# Patient Record
Sex: Male | Born: 1950 | Race: White | Hispanic: No | Marital: Single | Smoking: Never smoker
Health system: Southern US, Community
[De-identification: ages and names within clinical notes are randomized; demographics above are authoritative.]

## PROBLEM LIST (undated history)

## (undated) DIAGNOSIS — I219 Acute myocardial infarction, unspecified: Secondary | ICD-10-CM

## (undated) DIAGNOSIS — I2510 Atherosclerotic heart disease of native coronary artery without angina pectoris: Secondary | ICD-10-CM

## (undated) DIAGNOSIS — Z955 Presence of coronary angioplasty implant and graft: Secondary | ICD-10-CM

## (undated) DIAGNOSIS — I10 Essential (primary) hypertension: Secondary | ICD-10-CM

## (undated) DIAGNOSIS — E119 Type 2 diabetes mellitus without complications: Secondary | ICD-10-CM

---

## 2006-06-03 ENCOUNTER — Emergency Department (HOSPITAL_COMMUNITY): Admission: EM | Admit: 2006-06-03 | Discharge: 2006-06-03 | Payer: Self-pay | Admitting: Emergency Medicine

## 2006-12-31 ENCOUNTER — Ambulatory Visit: Payer: Self-pay | Admitting: Ophthalmology

## 2007-01-07 ENCOUNTER — Ambulatory Visit: Payer: Self-pay | Admitting: Ophthalmology

## 2007-02-03 ENCOUNTER — Ambulatory Visit: Payer: Self-pay | Admitting: Ophthalmology

## 2007-02-10 ENCOUNTER — Ambulatory Visit: Payer: Self-pay | Admitting: Ophthalmology

## 2007-05-24 ENCOUNTER — Emergency Department: Payer: Self-pay | Admitting: Emergency Medicine

## 2007-05-24 ENCOUNTER — Other Ambulatory Visit: Payer: Self-pay

## 2007-08-26 ENCOUNTER — Emergency Department: Payer: Self-pay | Admitting: Emergency Medicine

## 2007-09-18 ENCOUNTER — Ambulatory Visit: Payer: Self-pay

## 2007-11-21 ENCOUNTER — Other Ambulatory Visit: Payer: Self-pay

## 2007-11-21 ENCOUNTER — Ambulatory Visit: Payer: Self-pay | Admitting: General Practice

## 2007-12-25 ENCOUNTER — Ambulatory Visit: Payer: Self-pay | Admitting: Cardiology

## 2007-12-25 ENCOUNTER — Other Ambulatory Visit: Payer: Self-pay

## 2008-02-25 ENCOUNTER — Ambulatory Visit: Payer: Self-pay | Admitting: General Practice

## 2008-03-11 ENCOUNTER — Ambulatory Visit: Payer: Self-pay | Admitting: General Practice

## 2008-03-18 ENCOUNTER — Ambulatory Visit: Payer: Self-pay | Admitting: General Practice

## 2008-06-11 ENCOUNTER — Ambulatory Visit: Payer: Self-pay | Admitting: Cardiology

## 2011-03-01 ENCOUNTER — Inpatient Hospital Stay: Payer: Self-pay | Admitting: Psychiatry

## 2011-03-14 ENCOUNTER — Ambulatory Visit: Payer: Self-pay

## 2011-06-02 ENCOUNTER — Ambulatory Visit: Payer: Self-pay | Admitting: General Practice

## 2011-06-21 ENCOUNTER — Ambulatory Visit (HOSPITAL_COMMUNITY)
Admission: RE | Admit: 2011-06-21 | Discharge: 2011-06-21 | Disposition: A | Discharge status: Home / Self Care | Payer: Medicare Other | Source: Ambulatory Visit | Attending: Neurosurgery | Admitting: Neurosurgery

## 2011-06-21 ENCOUNTER — Encounter (HOSPITAL_COMMUNITY)
Admission: RE | Admit: 2011-06-21 | Discharge: 2011-06-21 | Disposition: A | Discharge status: Home / Self Care | Payer: Medicare Other | Source: Ambulatory Visit | Attending: Neurosurgery | Admitting: Neurosurgery

## 2011-06-21 ENCOUNTER — Other Ambulatory Visit (HOSPITAL_COMMUNITY): Payer: Self-pay | Admitting: Neurosurgery

## 2011-06-21 DIAGNOSIS — M4802 Spinal stenosis, cervical region: Secondary | ICD-10-CM

## 2011-06-21 DIAGNOSIS — Z01818 Encounter for other preprocedural examination: Secondary | ICD-10-CM | POA: Insufficient documentation

## 2011-06-21 DIAGNOSIS — Z01812 Encounter for preprocedural laboratory examination: Secondary | ICD-10-CM | POA: Insufficient documentation

## 2011-06-21 LAB — CBC
MCH: 30.3 pg (ref 26.0–34.0)
MCHC: 34.6 g/dL (ref 30.0–36.0)
Platelets: 182 10*3/uL (ref 150–400)

## 2011-06-21 LAB — BASIC METABOLIC PANEL
Calcium: 9.4 mg/dL (ref 8.4–10.5)
GFR calc Af Amer: >60 mL/min (ref >60)
GFR calc non Af Amer: >60 mL/min (ref >60)
Sodium: 138 mEq/L (ref 135–145)

## 2011-06-21 LAB — SURGICAL PCR SCREEN
MRSA, PCR: NEGATIVE
Staphylococcus aureus: NEGATIVE

## 2011-06-26 ENCOUNTER — Inpatient Hospital Stay (HOSPITAL_COMMUNITY): Payer: Medicare Other

## 2011-06-26 ENCOUNTER — Inpatient Hospital Stay (HOSPITAL_COMMUNITY)
Admission: RE | Admit: 2011-06-26 | Discharge: 2011-07-03 | DRG: 473 | Disposition: A | Discharge status: Home / Self Care | Payer: Medicare Other | Source: Ambulatory Visit | Attending: Neurosurgery | Admitting: Neurosurgery

## 2011-06-26 DIAGNOSIS — E1142 Type 2 diabetes mellitus with diabetic polyneuropathy: Secondary | ICD-10-CM | POA: Diagnosis present

## 2011-06-26 DIAGNOSIS — I10 Essential (primary) hypertension: Secondary | ICD-10-CM | POA: Diagnosis present

## 2011-06-26 DIAGNOSIS — R339 Retention of urine, unspecified: Secondary | ICD-10-CM | POA: Diagnosis not present

## 2011-06-26 DIAGNOSIS — Z794 Long term (current) use of insulin: Secondary | ICD-10-CM

## 2011-06-26 DIAGNOSIS — M5030 Other cervical disc degeneration, unspecified cervical region: Principal | ICD-10-CM | POA: Diagnosis present

## 2011-06-26 DIAGNOSIS — E1149 Type 2 diabetes mellitus with other diabetic neurological complication: Secondary | ICD-10-CM | POA: Diagnosis present

## 2011-06-26 DIAGNOSIS — I252 Old myocardial infarction: Secondary | ICD-10-CM

## 2011-06-26 DIAGNOSIS — Z9861 Coronary angioplasty status: Secondary | ICD-10-CM

## 2011-06-26 DIAGNOSIS — F341 Dysthymic disorder: Secondary | ICD-10-CM | POA: Diagnosis present

## 2011-06-26 LAB — GLUCOSE, CAPILLARY
Glucose-Capillary: 139 mg/dL — ABNORMAL HIGH (ref 70–99)
Glucose-Capillary: 88 mg/dL (ref 70–99)

## 2011-06-26 LAB — TYPE AND SCREEN: ABO/RH(D): O POS

## 2011-06-26 LAB — ABO/RH: ABO/RH(D): O POS

## 2011-06-27 LAB — GLUCOSE, CAPILLARY: Glucose-Capillary: 161 mg/dL — ABNORMAL HIGH (ref 70–99)

## 2011-06-28 LAB — GLUCOSE, CAPILLARY
Glucose-Capillary: 85 mg/dL (ref 70–99)
Glucose-Capillary: 71 mg/dL (ref 70–99)

## 2011-06-29 LAB — GLUCOSE, CAPILLARY
Glucose-Capillary: 224 mg/dL — ABNORMAL HIGH (ref 70–99)
Glucose-Capillary: 120 mg/dL — ABNORMAL HIGH (ref 70–99)

## 2011-07-02 LAB — GLUCOSE, CAPILLARY
Glucose-Capillary: 211 mg/dL — ABNORMAL HIGH (ref 70–99)
Glucose-Capillary: 259 mg/dL — ABNORMAL HIGH (ref 70–99)
Glucose-Capillary: 283 mg/dL — ABNORMAL HIGH (ref 70–99)
Glucose-Capillary: 137 mg/dL — ABNORMAL HIGH (ref 70–99)
Glucose-Capillary: 149 mg/dL — ABNORMAL HIGH (ref 70–99)
Glucose-Capillary: 85 mg/dL (ref 70–99)

## 2011-07-03 LAB — GLUCOSE, CAPILLARY
Glucose-Capillary: 127 mg/dL — ABNORMAL HIGH (ref 70–99)
Glucose-Capillary: 79 mg/dL (ref 70–99)

## 2011-07-03 NOTE — Op Note (Signed)
NAME:  Daniel [name], Daniel [name] NO.:  [id number]  MEDICAL RECORD NO.:  [id number]  LOCATION:  3107                         FACILITY:  [facility]  PHYSICIAN:  Hilda Lias, M.D.   DATE OF BIRTH:  Jul 17, 1951  DATE OF PROCEDURE:  06/26/2011 DATE OF DISCHARGE:                              OPERATIVE REPORT   PREOPERATIVE DIAGNOSIS:  Degenerative disk disease with chronic radiculopathy C3-4, C4-5, C5-6, C6-7 with diabetes, depression  POSTOPERATIVE DIAGNOSES:  Degenerative disk disease with chronic radiculopathy C3-4, C4-5, C5-6, C6-7 with diabetes, depression.  PROCEDURE:  Anterior C3-4, C4-5, C5-6, C6-7 diskectomy, decompression of the spinal cord, interbody fusion plate microscope.  SURGEON:  Hilda Lias, MD  ASSISTANT:  Coletta Memos, MD  CLINICAL HISTORY:  The patient was seen in my office complaining of neck pain worsened to the both upper extremity, left worse than right one for many years.  The patient has a history of diabetes and depression.  X- rays showed severe degenerative disk disease with almost no space between C3-4, C4-5, C5-6, C6-7.  Surgery was advised and the risks were explained to him and his brother.  PROCEDURE:  The patient was taken to the OR and after intubation, the left side of the neck was cleaned with DuraPrep.  Longitudinal incision was made through the skin, subcutaneous tissue, platysma down to cervical spine.  Immediately we found large anterior osteophytes which we removed.  Then we opened the lumbar disk after we proved that was C6- 7.  To get into the disk space, we had to drill all the way down to the posterior ligament which was calcified.  We had to drill also posterior ligament to be able to get into the epidural space.  Then using the 1 and 2-mm Kerrison punch, decompression of the spinal cord as well as the both C7 nerve root was done.  The same procedure was done at the level of C5-6.  The most difficult part was at the level  of C4-5 and C3-4 because this patient has almost no space and there were large osteophyte.  The osteophytes were drilled and then using a needle we were able to identify the space.  We opened the calcified ligament at C3- 4 and C4-5 and we started going to the posterior ligament.  At the end we had decompression of the C4 nerve root, the spinal cord at the level C3-4 and also at the level of C4-5.  Having good decompression, an allograft with autograft inside and PBX was inserted at the level of C3- 4.  The size was 7 mm height lordotic.  Another one of 7 mm was inserted at the level of C4-5, C5-6, C6-7.  Then we had to drill to adjust the plate.  We had to reduce hyperflexion of the plate and remove more osteophyte.  At the end we used a plate from C3 down to C7 using 10 screws.  Lateral cervical spine showed good position of the graft and the plate and the screws.  Although we achieved good hemostasis because of the extension of the procedure, we left graft in the precervical area.  From then on the wound was closed with Vicryl and Steri-Strips.  ______________________________ Hilda Lias, M.D.     EB/MEDQ  D:  06/26/2011  T:  06/26/2011  Job:  [id number]  Electronically Signed by Hilda Lias M.D. on 07/03/2011 05:57:25 PM

## 2011-07-03 NOTE — Discharge Summary (Signed)
  NAMEMarland [name]  SHALOM, MCGUINESS NO.:  [id number]  MEDICAL RECORD NO.:  [id number]  LOCATION:  3022                         FACILITY:  [facility]  PHYSICIAN:  Hilda Lias, M.D.   DATE OF BIRTH:  September 04, 1951  DATE OF ADMISSION:  06/26/2011 DATE OF DISCHARGE:  07/03/2011                              DISCHARGE SUMMARY   ADMISSION DIAGNOSES:  Degenerative disk disease, cervical spine, C3-4, C4-5, C5-6, C6-7, with diabetes, depression.  POSTOPERATIVE DIAGNOSES:  Degenerative disk disease, cervical spine, C3- 4, C4-5, C5-6, C6-7, with diabetes, depression, urinary retention.  CLINICAL HISTORY:  The patient was admitted because of neck pain, radiating to both upper extremity.  X-ray showed quite a bit of cervical stenosis of C3-C7.  The patient has a history of depression plus diabetes.  In the past, he has problem with his bladder.  Because of the finding, surgery was advised.  LABORATORY:  Right now is grossly normal. COURSE IN THE HOSPITAL:  The patient was taken to surgery and anterior decompression from C3-C7 with plate was achieved.  The patient did well. We kept him overnight in the intensive care unit, later on he was transferred to the floor.  His main problem right now he has been having urinary retention.  We have using Flomax.  He was seen by the urologist yesterday who feel that he has an enlarged prostate.  The patient already has an appointment to see by his urologist, see Dr. Achilles Dunk in [place].  Today, neck wise, he is doing really well.  His glucose well controlled.  He is being discharged.  The brother is here with him.  CONDITION ON DISCHARGE:  Improvement.  MEDICATION:  Percocet, diazepam, and Flomax.  He will continue all the previous medications.  We will set Plavix once he is being seen by the urologist.  DIET:  Diabetic as preop.  ACTIVITY:  Not to drive for at least a week.  FOLLOWUP:  To call me to see me in 3-4 weeks or before as needed.         ______________________________ Hilda Lias, M.D.    EB/MEDQ  D:  07/03/2011  T:  07/03/2011  Job:  [id number]  Electronically Signed by Hilda Lias M.D. on 07/03/2011 06:14:46 PM

## 2011-07-11 NOTE — Consult Note (Signed)
NAME:  Daniel [name], Daniel [name]              ACCOUNT NO.:  [id number]  MEDICAL RECORD NO.:  [id number]  LOCATION:                                 FACILITY:  PHYSICIAN:  Excell Seltzer. Annabell Howells, M.D.    DATE OF BIRTH:  1951-03-18  DATE OF CONSULTATION:  07/02/2011 DATE OF DISCHARGE:                                CONSULTATION   Patient of Dr. Hilda Lias.  CHIEF COMPLAINT:  Retention.  HISTORY:  Daniel [name] is a 60 year old white male, diabetic with urinary retention following a multilevel cervical fusion.  He has had two voiding trials which he has failed despite Flomax.  Prior to this admission, he had some issues with urge incontinence and had a prior UTI but no GU surgery.  He has marked diabetic neuropathy.  PAST MEDICAL HISTORY:  Significant for diabetes.  He has a history of hypertension, history of coronary artery disease with an MI in 2009 and a stent.  He has arthritis.  He has had depression and anxiety.  PAST SURGICAL HISTORY:  Significant for coronary cath in 2009 with stent at that time.  He has no known drug allergies.  CURRENT MEDICATIONS:  Insulin, Neurontin, Foltx, Glucotrol, Glucophage, Norvasc, Tradjenta, Lopressor, Cymbalta, Actos, Crestor, Lotensin, Apresoline, Colace, Flomax, Ambien, MiraLax, oxycodone, Valium as needed, Zofran as needed, Senokot, acetaminophen.  SOCIAL HISTORY:  Negative for tobacco or alcohol.  He lives in [place].  His brother manages most of his care.  FAMILY HISTORY:  Unremarkable.  REVIEW OF SYSTEMS:  He denies fever, chills, or abdominal pain.  His left shoulder and arm pain has improved postsurgery.  He denies constipation, following laxative use.  He has reduced sensation in his lower extremities but no edema.  He denies chest pain or shortness of breath.  He is, otherwise, without complaint on full review of systems.  PHYSICAL EXAMINATION:  VITAL SIGNS:  Temperature 98.1, pulse 58, respirations 16, blood pressure is  105/71. GENERAL:  He is a well-developed well-nourished white male, in acute distress, alert and oriented x3. HEENT:  Head and face normocephalic, atraumatic. NECK:  He has a recent left surgical wound which appears intact. ABDOMEN:  Soft, obese, nontender without mass, hepatosplenomegaly, or hernia. GENITOURINARY:  A Foley at the meatus.  He has normal scrotum, testes, and epididymides.  He has no inguinal adenopathy. RECTAL:  Normal sphincter tone without masses.  Prostate is 1 to 2+ in size, benign in consistency without nodules.  Seminal vesicles are unremarkable. EXTREMITIES:  Full range of motion without edema. SKIN:  Warm, dry. NEUROLOGIC:  He has diminished sensation in his lower extremities.  I have reviewed his chart including labs.  His creatinine is 0.79.  He had a recent bladder scan with 750 mL of residual urine.  IMPRESSION: 1. Postop retention with probable diabetic neuropathy and possible     benign prostatic hypertrophy with bladder outlet obstruction. 2. Cymbalta can aggravate retention.  RECOMMENDATIONS:  Foley to leg bag fore discharge home.  His brother is to set up a GU followup appointment with Dr. Assunta Gambles in [place] on Tuesday of next week.  He can continue the Flomax, but at 0.4 mg dose. It would be best  if he could come off the Cymbalta.     Excell Seltzer. Annabell Howells, M.D.     JJW/MEDQ  D:  07/02/2011  T:  07/03/2011  Job:  [id number]  cc:   Hilda Lias, M.D. Assunta Gambles, MD  Electronically Signed by Bjorn Pippin M.D. on 07/11/2011 01:24:31 PM

## 2011-07-31 ENCOUNTER — Ambulatory Visit: Payer: Self-pay | Admitting: Urology

## 2011-08-03 ENCOUNTER — Emergency Department: Payer: Self-pay | Admitting: Emergency Medicine

## 2011-08-28 ENCOUNTER — Ambulatory Visit: Payer: Self-pay | Admitting: Urology

## 2012-04-23 ENCOUNTER — Emergency Department: Payer: Self-pay | Admitting: *Deleted

## 2012-04-23 LAB — URINALYSIS, COMPLETE
Bacteria: NONE SEEN
Blood: NEGATIVE
Glucose,UR: 150 mg/dL (ref 0–75)
Leukocyte Esterase: NEGATIVE
Nitrite: NEGATIVE
Ph: 5 (ref 4.5–8.0)
Protein: 30
Specific Gravity: 1.02 (ref 1.003–1.030)
Squamous Epithelial: NONE SEEN
WBC UR: 6 /HPF (ref 0–5)

## 2012-04-23 LAB — COMPREHENSIVE METABOLIC PANEL
Chloride: 105 mmol/L (ref 98–107)
EGFR (African American): >60
Osmolality: 293 (ref 275–301)
SGPT (ALT): 20 U/L
Sodium: 140 mmol/L (ref 136–145)

## 2012-04-23 LAB — CBC
HCT: 39 % — ABNORMAL LOW (ref 40.0–52.0)
HGB: 13 g/dL (ref 13.0–18.0)
MCV: 92 fL (ref 80–100)
Platelet: 197 10*3/uL (ref 150–440)
WBC: 6.6 10*3/uL (ref 3.8–10.6)

## 2012-04-24 LAB — TROPONIN I: Troponin-I: <0.02 ng/mL

## 2012-04-24 LAB — CK TOTAL AND CKMB (NOT AT ARMC): CK, Total: 94 U/L (ref 35–232)

## 2012-11-03 ENCOUNTER — Ambulatory Visit: Payer: Self-pay | Admitting: Internal Medicine

## 2014-09-01 ENCOUNTER — Emergency Department: Payer: Self-pay | Admitting: Emergency Medicine

## 2014-09-01 LAB — CBC
HCT: 48.6 % (ref 40.0–52.0)
HGB: 16.1 g/dL (ref 13.0–18.0)
MCH: 30 pg (ref 26.0–34.0)
MCHC: 33.2 g/dL (ref 32.0–36.0)
MCV: 91 fL (ref 80–100)
PLATELETS: 188 10*3/uL (ref 150–440)
RBC: 5.37 10*6/uL (ref 4.40–5.90)
RDW: 13.1 % (ref 11.5–14.5)
WBC: 7.8 10*3/uL (ref 3.8–10.6)

## 2014-09-01 LAB — COMPREHENSIVE METABOLIC PANEL
ALK PHOS: 102 U/L
ANION GAP: 10 (ref 7–16)
AST: 10 U/L — AB (ref 15–37)
Albumin: 3.5 g/dL (ref 3.4–5.0)
BILIRUBIN TOTAL: 0.4 mg/dL (ref 0.2–1.0)
BUN: 15 mg/dL (ref 7–18)
CALCIUM: 8.1 mg/dL — AB (ref 8.5–10.1)
CREATININE: 0.96 mg/dL (ref 0.60–1.30)
Chloride: 103 mmol/L (ref 98–107)
Co2: 24 mmol/L (ref 21–32)
EGFR (African American): >60
EGFR (Non-African Amer.): >60
Glucose: 321 mg/dL — ABNORMAL HIGH (ref 65–99)
OSMOLALITY: 287 (ref 275–301)
Potassium: 3.9 mmol/L (ref 3.5–5.1)
SGPT (ALT): 15 U/L
Sodium: 137 mmol/L (ref 136–145)
Total Protein: 6.6 g/dL (ref 6.4–8.2)

## 2014-10-22 ENCOUNTER — Emergency Department: Payer: Self-pay | Admitting: Emergency Medicine

## 2015-01-22 ENCOUNTER — Emergency Department: Payer: Self-pay | Admitting: Emergency Medicine

## 2015-04-27 ENCOUNTER — Emergency Department
Admission: EM | Admit: 2015-04-27 | Discharge: 2015-04-27 | Disposition: A | Discharge status: Home / Self Care | Payer: Commercial Managed Care - HMO | Attending: Student | Admitting: Student

## 2015-04-27 ENCOUNTER — Other Ambulatory Visit: Payer: Self-pay

## 2015-04-27 ENCOUNTER — Encounter: Payer: Self-pay | Admitting: Emergency Medicine

## 2015-04-27 ENCOUNTER — Emergency Department: Payer: Commercial Managed Care - HMO

## 2015-04-27 DIAGNOSIS — J90 Pleural effusion, not elsewhere classified: Secondary | ICD-10-CM | POA: Insufficient documentation

## 2015-04-27 DIAGNOSIS — I313 Pericardial effusion (noninflammatory): Secondary | ICD-10-CM

## 2015-04-27 DIAGNOSIS — R0602 Shortness of breath: Secondary | ICD-10-CM | POA: Diagnosis present

## 2015-04-27 DIAGNOSIS — I10 Essential (primary) hypertension: Secondary | ICD-10-CM | POA: Insufficient documentation

## 2015-04-27 DIAGNOSIS — E119 Type 2 diabetes mellitus without complications: Secondary | ICD-10-CM | POA: Diagnosis not present

## 2015-04-27 DIAGNOSIS — I3139 Other pericardial effusion (noninflammatory): Secondary | ICD-10-CM

## 2015-04-27 HISTORY — DX: Type 2 diabetes mellitus without complications: E11.9

## 2015-04-27 HISTORY — DX: Acute myocardial infarction, unspecified: I21.9

## 2015-04-27 HISTORY — DX: Atherosclerotic heart disease of native coronary artery without angina pectoris: I25.10

## 2015-04-27 HISTORY — DX: Presence of coronary angioplasty implant and graft: Z95.5

## 2015-04-27 HISTORY — DX: Essential (primary) hypertension: I10

## 2015-04-27 LAB — BASIC METABOLIC PANEL
Anion gap: 6 (ref 5–15)
BUN: 16 mg/dL (ref 6–20)
CO2: 25 mmol/L (ref 22–32)
CREATININE: 0.78 mg/dL (ref 0.61–1.24)
Calcium: 8.8 mg/dL — ABNORMAL LOW (ref 8.9–10.3)
Chloride: 107 mmol/L (ref 101–111)
Glucose, Bld: 291 mg/dL — ABNORMAL HIGH (ref 65–99)
POTASSIUM: 4 mmol/L (ref 3.5–5.1)
Sodium: 138 mmol/L (ref 135–145)

## 2015-04-27 LAB — TROPONIN I
TROPONIN I: 0.03 ng/mL (ref <0.031)
Troponin I: <0.03 ng/mL (ref <0.031)

## 2015-04-27 LAB — CBC WITH DIFFERENTIAL/PLATELET
BASOS ABS: 0 10*3/uL (ref 0–0.1)
BASOS PCT: 1 %
EOS ABS: 0.1 10*3/uL (ref 0–0.7)
Eosinophils Relative: 2 %
HEMATOCRIT: 44.2 % (ref 40.0–52.0)
HEMOGLOBIN: 14.4 g/dL (ref 13.0–18.0)
LYMPHS ABS: 1.4 10*3/uL (ref 1.0–3.6)
Lymphocytes Relative: 31 %
MCH: 29.5 pg (ref 26.0–34.0)
MCHC: 32.6 g/dL (ref 32.0–36.0)
MCV: 90.5 fL (ref 80.0–100.0)
MONO ABS: 0.4 10*3/uL (ref 0.2–1.0)
MONOS PCT: 8 %
NEUTROS PCT: 58 %
Neutro Abs: 2.6 10*3/uL (ref 1.4–6.5)
Platelets: 225 10*3/uL (ref 150–440)
RBC: 4.89 MIL/uL (ref 4.40–5.90)
RDW: 14.1 % (ref 11.5–14.5)
WBC: 4.5 10*3/uL (ref 3.8–10.6)

## 2015-04-27 MED ORDER — CLONIDINE HCL 0.1 MG PO TABS
ORAL_TABLET | ORAL | Status: AC
  Administered 2015-04-27: 0.1 mg via ORAL
  Filled 2015-04-27: qty 1

## 2015-04-27 MED ORDER — IOHEXOL 350 MG/ML SOLN
75.0000 mL | Freq: Once | INTRAVENOUS | Status: AC | PRN
  Administered 2015-04-27: 75 mL via INTRAVENOUS

## 2015-04-27 MED ORDER — CLONIDINE HCL 0.1 MG PO TABS
0.1000 mg | ORAL_TABLET | Freq: Once | ORAL | Status: AC
  Administered 2015-04-27: 0.1 mg via ORAL

## 2015-04-27 NOTE — Discharge Instructions (Signed)
Follow-up with Dr. Judithann Sheen' office tomorrow morning. You will be contacted with follow-up appointment details in the morning however if you have not heard from the office by 11 AM, please call them because you need to be seen tomorrow. You should return immediately to the emergency department if you develop worsening shortness of breath, any chest pain, lightheadedness, fainting or near fainting, fevers, numbness or weakness, or for any other concerns. Her blood pressure was elevated while you in the emergency department, please take your home blood pressure medicines as soon as you get home and follow-up with your doctor tomorrow as discussed.

## 2015-04-27 NOTE — ED Provider Notes (Signed)
[facility] Emergency Department Provider Note  ____________________________________________  Time seen: Approximately 4:51 PM  I have reviewed the triage vital signs and the nursing notes.   HISTORY  Chief Complaint Shortness of Breath    HPI Daniel [name] is a 64 y.o. male history of diabetes, coronary artery disease status post stents, hypertension who presents for evaluation of generalized intermittent mild shortness of breath since yesterday. He reports the shortness of breath occurs when he is lying on either his left or his right side. It improves when he sits up straight. It is not worsened by exertion, is not pleuritic. It is associated with some nonproductive cough which he has had for several days. No fevers or chills. No chest pain or fainting. No lightheadedness. Current severity is mild. He reports this does not feel like his prior heart attack.   Past Medical History  Diagnosis Date  . Diabetes mellitus without complication   . Stented coronary artery   . Myocardial infarct   . Hypertension   . Coronary artery disease     There are no active problems to display for this patient.   No past surgical history on file.  No current outpatient prescriptions on file.  Allergies Review of patient's allergies indicates no known allergies.  No family history on file.  Social History History  Substance Use Topics  . Smoking status: Never Smoker   . Smokeless tobacco: Not on file  . Alcohol Use: No    Review of Systems Constitutional: No fever/chills Eyes: No visual changes. ENT: No sore throat. Cardiovascular: Denies chest pain. Respiratory: + shortness of breath. Gastrointestinal: No abdominal pain.  No nausea, no vomiting.  No diarrhea.  No constipation. Genitourinary: Negative for dysuria. Musculoskeletal: Negative for back pain. Skin: Negative for rash. Neurological: Negative for headaches, focal weakness or  numbness.  10-point ROS otherwise negative.  ____________________________________________   PHYSICAL EXAM:  Filed Vitals:   04/27/15 1730 04/27/15 1921 04/27/15 2010 04/27/15 2038  BP: 181/110 185/118 184/113 176/120  Pulse: 87 88  85  Temp:      TempSrc:      Resp:  16  16  Height:      Weight:      SpO2: 96% 94%  96%    VITAL SIGNS: ED Triage Vitals  Enc Vitals Group     BP 04/27/15 1403 159/97 mmHg     Pulse Rate 04/27/15 1403 80     Resp 04/27/15 1403 18     Temp 04/27/15 1403 97.5 F (36.4 C)     Temp Source 04/27/15 1403 Oral     SpO2 04/27/15 1403 96 %     Weight 04/27/15 1403 165 lb (74.844 kg)     Height 04/27/15 1403  (1.753 m)     Head Cir --      Peak Flow --      Pain Score 04/27/15 1404 0     Pain Loc --      Pain Edu? --      Excl. in GC? --     Constitutional: Alert and oriented. Well appearing and in no acute distress. Eyes: Conjunctivae are normal. PERRL. EOMI. Head: Atraumatic. Nose: No congestion/rhinnorhea. Mouth/Throat: Mucous membranes are moist.  Oropharynx non-erythematous. Neck: No stridor. No JVD. Cardiovascular: Normal rate, regular rhythm. Grossly normal heart sounds.  Good peripheral circulation. Respiratory: Normal respiratory effort.  No retractions. Lungs CTAB. Gastrointestinal: Soft and nontender. No distention. No abdominal bruits. No CVA tenderness. Genitourinary:  deferred Musculoskeletal: No lower extremity tenderness nor edema.  No joint effusions. Neurologic:  Normal speech and language. No gross focal neurologic deficits are appreciated. Speech is normal. No gait instability. Skin:  Skin is warm, dry and intact. No rash noted. Psychiatric: Mood and affect are normal. Speech and behavior are normal.  ____________________________________________   LABS (all labs ordered are listed, but only abnormal results are displayed)  Labs Reviewed  BASIC METABOLIC PANEL - Abnormal; Notable for the following:    Glucose,  Bld 291 (*)    Calcium 8.8 (*)    All other components within normal limits  TROPONIN I  CBC WITH DIFFERENTIAL/PLATELET  TROPONIN I   ____________________________________________  EKG  ED ECG REPORT I, Gayla Doss, the attending physician, personally viewed and interpreted this ECG.   Date: 04/27/2015  EKG Time: 14:10  Rate: 79  Rhythm: normal sinus rhythm  Axis: left axis deviation  Intervals:none, normal intervals  ST&T Change: No acute ST segment changes, LVH with QRS widening with repolarization abnormality  ____________________________________________  RADIOLOGY  CXR FINDINGS: There are small pleural effusions bilaterally. Lungs elsewhere clear. Heart size and pulmonary vascularity are normal. No adenopathy. There is slight degenerative change in the thoracic spine. There is postoperative change in the lower cervical spine.  IMPRESSION: Small pleural effusions bilaterally. No edema or consolidation.   Left lateral decubitus CXR IMPRESSION: Free-flowing effusion on the left, fairly small.   CT angio chest IMPRESSION: No demonstrable pulmonary embolus.  Moderate pleural effusions are noted bilaterally. Areas of atelectatic change but no frank edema or consolidation.  There is a small pericardial effusion. There is also left ventricular hypertrophy.  In the right peritracheal region, there is a focal structure which has attenuation values consistent with fluid. Morphologically, this area has an appearance concerning for adenopathy. The low-attenuation in this area is not typical for adenopathy. Question either a mediastinal cyst or effusion tracking into this area via a congenital defect in the pleura near the azygos vein. Would advise repeat study after treatment for the pleural effusions to assess for persistence versus resolution. Note that there is no evidence suggesting adenopathy elsewhere.  There is thickening of the wall of the esophagus in  the distal half of the esophagus. This finding may warrant direct visualization to assess for potential Barrett's esophagus.   ____________________________________________   PROCEDURES  Procedure(s) performed: None  Critical Care performed: No  ____________________________________________   INITIAL IMPRESSION / ASSESSMENT AND PLAN / ED COURSE  Pertinent labs & imaging results that were available during my care of the patient were reviewed by me and considered in my medical decision making (see chart for details).  Daniel [name] is a 64 y.o. male history of diabetes, coronary artery disease status post stents, hypertension who presents for evaluation of generalized intermittent mild shortness of breath since yesterday. On exam, he is generally well-appearing and in no acute distress. Lungs are clear to auscultation bilaterally. No tachypnea, no hypoxia. Taking deep breaths does cause him to have a dry cough however he denies any chest pain. Differential is broad. Will plan for 2 sets of cardiac enzymes, chest x-ray, basic labs.  ----------------------------------------- 8:35 PM on 04/27/2015 -----------------------------------------  CTA chest is negative for any evidence of pulmonary embolism. No evidence of obvious malignancy however there are bilateral moderate pleural effusions as well as a small pericardial effusion; however there is no evidence of hemodynamic compromise. 2 sets of cardiac enzymes are negative making ACS less likely, doubt myocarditis. Question possible  pericarditis. Patient continues to have oxygen saturations of 95, 96%. No oxygen requirement. He does not desaturate with ambulation. Heart rate is in the 80s. No increased work of breathing. I discussed the case with Dr. Dan Humphreys and his office will contact the patient tomorrow with follow-up appointment details. Discussed meticulous return precautions with the patient and his brother were at bedside. Currently there  is not an indication for admission given how well the patient appears and the fact that outpatient thoracentesis can be arranged by primary care doctor tomorrow. The patient is comfortable with the discharge plan. Blood pressure 173/109 on discharge. I have encouraged him to take his home anti-hypertensive medications. ____________________________________________   FINAL CLINICAL IMPRESSION(S) / ED DIAGNOSES  Final diagnoses:  Pleural effusion  Pericardial effusion without cardiac tamponade  SOB (shortness of breath)      Gayla Doss, MD 04/28/15 0045

## 2015-04-27 NOTE — ED Notes (Signed)
Pre-ambulation HR 87, O2 sats 95% on RA

## 2015-04-27 NOTE — ED Notes (Signed)
Post ambulation HR 90, O2 sats 98% on RA.

## 2015-04-27 NOTE — ED Notes (Signed)
Says he has shortness of breath since yesterday.  Says little cough.  Breathing worse when he lies down especially on side.

## 2015-04-27 NOTE — ED Notes (Signed)
Per pt, he has problems breathing when he lays on either side. He also develops a dry cough. Pt sitting on side of bed at this time without sob or s/s of respiratory distress

## 2015-10-30 ENCOUNTER — Encounter: Payer: Self-pay | Admitting: Emergency Medicine

## 2015-10-30 ENCOUNTER — Emergency Department: Payer: Commercial Managed Care - HMO

## 2015-10-30 ENCOUNTER — Inpatient Hospital Stay
Admission: EM | Admit: 2015-10-30 | Discharge: 2015-11-14 | DRG: 252 | Disposition: A | Discharge status: Skilled Nursing Facility | Payer: Commercial Managed Care - HMO | Attending: Internal Medicine | Admitting: Internal Medicine

## 2015-10-30 DIAGNOSIS — E1165 Type 2 diabetes mellitus with hyperglycemia: Secondary | ICD-10-CM | POA: Diagnosis present

## 2015-10-30 DIAGNOSIS — L03031 Cellulitis of right toe: Secondary | ICD-10-CM | POA: Diagnosis present

## 2015-10-30 DIAGNOSIS — E1122 Type 2 diabetes mellitus with diabetic chronic kidney disease: Secondary | ICD-10-CM | POA: Diagnosis present

## 2015-10-30 DIAGNOSIS — R7989 Other specified abnormal findings of blood chemistry: Secondary | ICD-10-CM

## 2015-10-30 DIAGNOSIS — Z7982 Long term (current) use of aspirin: Secondary | ICD-10-CM | POA: Diagnosis not present

## 2015-10-30 DIAGNOSIS — I5023 Acute on chronic systolic (congestive) heart failure: Secondary | ICD-10-CM | POA: Diagnosis present

## 2015-10-30 DIAGNOSIS — M869 Osteomyelitis, unspecified: Secondary | ICD-10-CM | POA: Diagnosis present

## 2015-10-30 DIAGNOSIS — H5442 Blindness, left eye, normal vision right eye: Secondary | ICD-10-CM | POA: Diagnosis present

## 2015-10-30 DIAGNOSIS — I25110 Atherosclerotic heart disease of native coronary artery with unstable angina pectoris: Secondary | ICD-10-CM | POA: Diagnosis present

## 2015-10-30 DIAGNOSIS — L89312 Pressure ulcer of right buttock, stage 2: Secondary | ICD-10-CM | POA: Diagnosis present

## 2015-10-30 DIAGNOSIS — D649 Anemia, unspecified: Secondary | ICD-10-CM | POA: Diagnosis present

## 2015-10-30 DIAGNOSIS — K219 Gastro-esophageal reflux disease without esophagitis: Secondary | ICD-10-CM | POA: Diagnosis present

## 2015-10-30 DIAGNOSIS — I130 Hypertensive heart and chronic kidney disease with heart failure and stage 1 through stage 4 chronic kidney disease, or unspecified chronic kidney disease: Secondary | ICD-10-CM | POA: Diagnosis present

## 2015-10-30 DIAGNOSIS — R739 Hyperglycemia, unspecified: Secondary | ICD-10-CM

## 2015-10-30 DIAGNOSIS — L97519 Non-pressure chronic ulcer of other part of right foot with unspecified severity: Secondary | ICD-10-CM | POA: Diagnosis present

## 2015-10-30 DIAGNOSIS — I252 Old myocardial infarction: Secondary | ICD-10-CM

## 2015-10-30 DIAGNOSIS — I214 Non-ST elevation (NSTEMI) myocardial infarction: Secondary | ICD-10-CM | POA: Diagnosis present

## 2015-10-30 DIAGNOSIS — E1152 Type 2 diabetes mellitus with diabetic peripheral angiopathy with gangrene: Principal | ICD-10-CM | POA: Diagnosis present

## 2015-10-30 DIAGNOSIS — T8131XA Disruption of external operation (surgical) wound, not elsewhere classified, initial encounter: Secondary | ICD-10-CM | POA: Diagnosis not present

## 2015-10-30 DIAGNOSIS — E11621 Type 2 diabetes mellitus with foot ulcer: Secondary | ICD-10-CM | POA: Diagnosis present

## 2015-10-30 DIAGNOSIS — Z7984 Long term (current) use of oral hypoglycemic drugs: Secondary | ICD-10-CM | POA: Diagnosis not present

## 2015-10-30 DIAGNOSIS — Z95828 Presence of other vascular implants and grafts: Secondary | ICD-10-CM

## 2015-10-30 DIAGNOSIS — R778 Other specified abnormalities of plasma proteins: Secondary | ICD-10-CM

## 2015-10-30 DIAGNOSIS — R29898 Other symptoms and signs involving the musculoskeletal system: Secondary | ICD-10-CM

## 2015-10-30 DIAGNOSIS — E11628 Type 2 diabetes mellitus with other skin complications: Secondary | ICD-10-CM | POA: Diagnosis present

## 2015-10-30 DIAGNOSIS — H4310 Vitreous hemorrhage, unspecified eye: Secondary | ICD-10-CM | POA: Diagnosis present

## 2015-10-30 DIAGNOSIS — E1169 Type 2 diabetes mellitus with other specified complication: Secondary | ICD-10-CM | POA: Diagnosis present

## 2015-10-30 DIAGNOSIS — L03115 Cellulitis of right lower limb: Secondary | ICD-10-CM | POA: Diagnosis present

## 2015-10-30 DIAGNOSIS — B9561 Methicillin susceptible Staphylococcus aureus infection as the cause of diseases classified elsewhere: Secondary | ICD-10-CM | POA: Diagnosis present

## 2015-10-30 DIAGNOSIS — R531 Weakness: Secondary | ICD-10-CM | POA: Diagnosis not present

## 2015-10-30 DIAGNOSIS — L97509 Non-pressure chronic ulcer of other part of unspecified foot with unspecified severity: Secondary | ICD-10-CM

## 2015-10-30 DIAGNOSIS — J9601 Acute respiratory failure with hypoxia: Secondary | ICD-10-CM | POA: Diagnosis present

## 2015-10-30 DIAGNOSIS — Z955 Presence of coronary angioplasty implant and graft: Secondary | ICD-10-CM | POA: Diagnosis not present

## 2015-10-30 DIAGNOSIS — R7881 Bacteremia: Secondary | ICD-10-CM | POA: Diagnosis present

## 2015-10-30 DIAGNOSIS — E876 Hypokalemia: Secondary | ICD-10-CM | POA: Diagnosis not present

## 2015-10-30 DIAGNOSIS — I200 Unstable angina: Secondary | ICD-10-CM | POA: Diagnosis present

## 2015-10-30 DIAGNOSIS — K5900 Constipation, unspecified: Secondary | ICD-10-CM

## 2015-10-30 DIAGNOSIS — I509 Heart failure, unspecified: Secondary | ICD-10-CM

## 2015-10-30 DIAGNOSIS — E785 Hyperlipidemia, unspecified: Secondary | ICD-10-CM | POA: Diagnosis present

## 2015-10-30 DIAGNOSIS — I7025 Atherosclerosis of native arteries of other extremities with ulceration: Secondary | ICD-10-CM

## 2015-10-30 DIAGNOSIS — L97929 Non-pressure chronic ulcer of unspecified part of left lower leg with unspecified severity: Secondary | ICD-10-CM | POA: Diagnosis present

## 2015-10-30 DIAGNOSIS — I639 Cerebral infarction, unspecified: Secondary | ICD-10-CM

## 2015-10-30 LAB — COMPREHENSIVE METABOLIC PANEL
ALK PHOS: 97 U/L (ref 38–126)
ALT: 17 U/L (ref 17–63)
AST: 23 U/L (ref 15–41)
Albumin: 4.1 g/dL (ref 3.5–5.0)
Anion gap: 15 (ref 5–15)
BILIRUBIN TOTAL: 1.3 mg/dL — AB (ref 0.3–1.2)
BUN: 18 mg/dL (ref 6–20)
CALCIUM: 9 mg/dL (ref 8.9–10.3)
CHLORIDE: 99 mmol/L — AB (ref 101–111)
CO2: 25 mmol/L (ref 22–32)
CREATININE: 0.91 mg/dL (ref 0.61–1.24)
GFR calc Af Amer: >60 mL/min (ref >60)
Glucose, Bld: 395 mg/dL — ABNORMAL HIGH (ref 65–99)
Potassium: 3.6 mmol/L (ref 3.5–5.1)
Sodium: 139 mmol/L (ref 135–145)
TOTAL PROTEIN: 7.6 g/dL (ref 6.5–8.1)

## 2015-10-30 LAB — GLUCOSE, CAPILLARY: Glucose-Capillary: 381 mg/dL — ABNORMAL HIGH (ref 65–99)

## 2015-10-30 LAB — URINALYSIS COMPLETE WITH MICROSCOPIC (ARMC ONLY)
BACTERIA UA: NONE SEEN
BILIRUBIN URINE: NEGATIVE
Leukocytes, UA: NEGATIVE
Nitrite: NEGATIVE
Protein, ur: 100 mg/dL — AB
SQUAMOUS EPITHELIAL / LPF: NONE SEEN
Specific Gravity, Urine: 1.012 (ref 1.005–1.030)
pH: 5 (ref 5.0–8.0)

## 2015-10-30 LAB — CBC WITH DIFFERENTIAL/PLATELET
BASOS PCT: 0 %
Basophils Absolute: 0 10*3/uL (ref 0–0.1)
Eosinophils Absolute: 0 10*3/uL (ref 0–0.7)
Eosinophils Relative: 0 %
HEMATOCRIT: 40.3 % (ref 40.0–52.0)
HEMOGLOBIN: 13.5 g/dL (ref 13.0–18.0)
LYMPHS PCT: 8 %
Lymphs Abs: 0.9 10*3/uL — ABNORMAL LOW (ref 1.0–3.6)
MCH: 30.4 pg (ref 26.0–34.0)
MCHC: 33.5 g/dL (ref 32.0–36.0)
MCV: 90.8 fL (ref 80.0–100.0)
MONOS PCT: 10 %
Monocytes Absolute: 1.1 10*3/uL — ABNORMAL HIGH (ref 0.2–1.0)
NEUTROS ABS: 10 10*3/uL — AB (ref 1.4–6.5)
NEUTROS PCT: 82 %
Platelets: 207 10*3/uL (ref 150–440)
RBC: 4.44 MIL/uL (ref 4.40–5.90)
RDW: 14.2 % (ref 11.5–14.5)
WBC: 12 10*3/uL — ABNORMAL HIGH (ref 3.8–10.6)

## 2015-10-30 LAB — PROTIME-INR
INR: 1.1
Prothrombin Time: 14.4 seconds (ref 11.4–15.0)

## 2015-10-30 LAB — TROPONIN I
TROPONIN I: 0.33 ng/mL — AB (ref <0.031)
TROPONIN I: 0.66 ng/mL — AB (ref <0.031)

## 2015-10-30 LAB — HEPARIN LEVEL (UNFRACTIONATED): Heparin Unfractionated: 0.1 IU/mL — ABNORMAL LOW (ref 0.30–0.70)

## 2015-10-30 LAB — APTT: aPTT: 32 seconds (ref 24–36)

## 2015-10-30 LAB — BRAIN NATRIURETIC PEPTIDE: B Natriuretic Peptide: 1240 pg/mL — ABNORMAL HIGH (ref 0.0–100.0)

## 2015-10-30 MED ORDER — PREGABALIN 75 MG PO CAPS
75.0000 mg | ORAL_CAPSULE | Freq: Two times a day (BID) | ORAL | Status: DC
  Administered 2015-10-31 – 2015-11-14 (×27): 75 mg via ORAL
  Filled 2015-10-30 (×28): qty 1

## 2015-10-30 MED ORDER — ATORVASTATIN CALCIUM 10 MG PO TABS: 10.0000 mg | ORAL_TABLET | Freq: Every day | ORAL | Status: DC

## 2015-10-30 MED ORDER — ONDANSETRON HCL 4 MG/2ML IJ SOLN
4.0000 mg | Freq: Once | INTRAMUSCULAR | Status: AC
  Administered 2015-10-30: 4 mg via INTRAVENOUS
  Filled 2015-10-30: qty 2

## 2015-10-30 MED ORDER — HEPARIN BOLUS VIA INFUSION
4000.0000 [IU] | Freq: Once | INTRAVENOUS | Status: AC
  Administered 2015-10-30: 4000 [IU] via INTRAVENOUS
  Filled 2015-10-30: qty 4000

## 2015-10-30 MED ORDER — HEPARIN SODIUM (PORCINE) 5000 UNIT/ML IJ SOLN
INTRAMUSCULAR | Status: AC
  Administered 2015-10-30: 4000 [IU]
  Filled 2015-10-30: qty 1

## 2015-10-30 MED ORDER — VANCOMYCIN HCL IN DEXTROSE 1-5 GM/200ML-% IV SOLN: 1000.0000 mg | Freq: Once | INTRAVENOUS | Status: DC

## 2015-10-30 MED ORDER — PANTOPRAZOLE SODIUM 40 MG PO TBEC
40.0000 mg | DELAYED_RELEASE_TABLET | Freq: Every day | ORAL | Status: DC
  Administered 2015-11-01 – 2015-11-14 (×13): 40 mg via ORAL
  Filled 2015-10-30 (×14): qty 1

## 2015-10-30 MED ORDER — LOSARTAN POTASSIUM 50 MG PO TABS
100.0000 mg | ORAL_TABLET | Freq: Every day | ORAL | Status: DC
  Administered 2015-11-01 – 2015-11-14 (×12): 100 mg via ORAL
  Filled 2015-10-30 (×12): qty 2

## 2015-10-30 MED ORDER — ACETAMINOPHEN 325 MG PO TABS: 650.0000 mg | ORAL_TABLET | ORAL | Status: DC | PRN

## 2015-10-30 MED ORDER — ONDANSETRON HCL 4 MG/2ML IJ SOLN: 4.0000 mg | Freq: Four times a day (QID) | INTRAMUSCULAR | Status: DC | PRN

## 2015-10-30 MED ORDER — METOPROLOL TARTRATE 25 MG PO TABS
25.0000 mg | ORAL_TABLET | Freq: Two times a day (BID) | ORAL | Status: DC
  Administered 2015-10-30 – 2015-11-14 (×29): 25 mg via ORAL
  Filled 2015-10-30 (×29): qty 1

## 2015-10-30 MED ORDER — ASPIRIN EC 81 MG PO TBEC
81.0000 mg | DELAYED_RELEASE_TABLET | Freq: Every day | ORAL | Status: DC
  Administered 2015-10-31 – 2015-11-14 (×12): 81 mg via ORAL
  Filled 2015-10-30 (×12): qty 1

## 2015-10-30 MED ORDER — HEPARIN (PORCINE) IN NACL 100-0.45 UNIT/ML-% IJ SOLN
12.0000 [IU]/kg/h | INTRAMUSCULAR | Status: DC
  Administered 2015-10-30: 12 [IU]/kg/h via INTRAVENOUS
  Filled 2015-10-30: qty 250

## 2015-10-30 MED ORDER — NITROGLYCERIN 0.4 MG SL SUBL: 0.4000 mg | SUBLINGUAL_TABLET | SUBLINGUAL | Status: DC | PRN

## 2015-10-30 MED ORDER — FUROSEMIDE 10 MG/ML IJ SOLN
40.0000 mg | Freq: Once | INTRAMUSCULAR | Status: AC
  Administered 2015-10-30: 40 mg via INTRAVENOUS
  Filled 2015-10-30: qty 4

## 2015-10-30 MED ORDER — PIPERACILLIN-TAZOBACTAM 3.375 G IVPB 30 MIN
3.3750 g | Freq: Once | INTRAVENOUS | Status: AC
  Administered 2015-10-30: 3.375 g via INTRAVENOUS
  Filled 2015-10-30: qty 50

## 2015-10-30 MED ORDER — SODIUM CHLORIDE 0.9 % IV BOLUS (SEPSIS)
1000.0000 mL | Freq: Once | INTRAVENOUS | Status: AC
  Administered 2015-10-30: 1000 mL via INTRAVENOUS
  Filled 2015-10-30: qty 1000

## 2015-10-30 MED ORDER — MORPHINE SULFATE (PF) 4 MG/ML IV SOLN
4.0000 mg | Freq: Once | INTRAVENOUS | Status: AC
  Administered 2015-10-30: 4 mg via INTRAVENOUS
  Filled 2015-10-30: qty 1

## 2015-10-30 MED ORDER — VANCOMYCIN HCL IN DEXTROSE 1-5 GM/200ML-% IV SOLN
1000.0000 mg | Freq: Two times a day (BID) | INTRAVENOUS | Status: DC
Start: 2015-10-31 — End: 2015-11-02
  Administered 2015-10-31 – 2015-11-02 (×5): 1000 mg via INTRAVENOUS
  Filled 2015-10-30 (×6): qty 200

## 2015-10-30 MED ORDER — VANCOMYCIN HCL IN DEXTROSE 1-5 GM/200ML-% IV SOLN
1000.0000 mg | Freq: Once | INTRAVENOUS | Status: AC
  Administered 2015-10-30: 1000 mg via INTRAVENOUS
  Filled 2015-10-30: qty 200

## 2015-10-30 MED ORDER — OXYCODONE HCL 5 MG PO TABS
5.0000 mg | ORAL_TABLET | ORAL | Status: DC | PRN
  Administered 2015-10-30 – 2015-11-02 (×10): 5 mg via ORAL
  Filled 2015-10-30 (×11): qty 1

## 2015-10-30 MED ORDER — GABAPENTIN 600 MG PO TABS: 600.0000 mg | ORAL_TABLET | Freq: Three times a day (TID) | ORAL | Status: DC

## 2015-10-30 MED ORDER — FUROSEMIDE 10 MG/ML IJ SOLN
20.0000 mg | Freq: Two times a day (BID) | INTRAMUSCULAR | Status: DC
  Administered 2015-10-31 (×2): 20 mg via INTRAVENOUS
  Filled 2015-10-30 (×2): qty 2

## 2015-10-30 NOTE — ED Notes (Signed)
Pt to ER with c/o generalized weakness.  Pt states tried to get out of bed and his legs did not work.  Per EMS CBG 403.  Pt states he does not check his BS regularly.  Also states he has a sore on his toe that is not healing.

## 2015-10-30 NOTE — ED Notes (Signed)
Called lab to request blood draw d/t need for lab draw earlier.  Per Cherylynn RidgesYakana in lab will be 30min to an hour before they can come, but they will be over for draw.

## 2015-10-30 NOTE — Progress Notes (Signed)
ANTICOAGULATION CONSULT NOTE - Initial Consult  Pharmacy Consult for heparin drip Indication: chest pain/ACS  No Known Allergies  Patient Measurements: Height: 5\' 8"  (172.7 cm) Weight: 170 lb (77.111 kg) IBW/kg (Calculated) : 68.4 Heparin Dosing Weight: 77.1 kg  Vital Signs: Temp: 98.2 F (36.8 C) (12/25 1246) BP: 166/98 mmHg (12/25 1427) Pulse Rate: 102 (12/25 1427)  Labs:  Recent Labs  10/30/15 1424  CREATININE 0.91  TROPONINI 0.33*    Estimated Creatinine Clearance: 80.4 mL/min (by C-G formula based on Cr of 0.91).   Medical History: Past Medical History  Diagnosis Date  . Diabetes mellitus without complication (HCC)   . Stented coronary artery   . Myocardial infarct (HCC)   . Hypertension   . Coronary artery disease    Assessment: Pharmacy consulted to dose a heparin drip in this 64 year old male for chest pain/ACS. Patient was not taking anticoagulants prior to admission per medication rec. Baseline CBC, protime/INR, and APTT were obtained in ED and are pending.   Heparin dosing weight = 77.1 kg  Goal of Therapy:  Heparin level 0.3-0.7 units/ml Monitor platelets by anticoagulation protocol: Yes   Plan:  Give 4000 units bolus x 1 (~ 51 units/kg) Start heparin infusion at 950 units/hr (~12 units/kg/hr) Check anti-Xa level in 6 hours and daily while on heparin Continue to monitor H&H and platelets  Jodelle RedMary M Jeniece Hannis 10/30/2015,3:47 PM

## 2015-10-30 NOTE — Progress Notes (Signed)
Pt requesting food and a pill for pain.  MD, Dr. Anne HahnWillis notified.  MD approved po pain medication and pt can have a sandwich tray prior to midnight.  Will continue to monitor. Daniel [name], Geraldo Haris H

## 2015-10-30 NOTE — ED Provider Notes (Signed)
[facility] Emergency Department Provider Note   ____________________________________________  Time seen: Upon arrival to room I have reviewed the triage vital signs and the triage nursing note.  HISTORY  Chief Complaint Weakness and Hyperglycemia   Historian Patient  HPI Daniel [name] is a 64 y.o. male with a history of hypertension, coronary disease, diabetes, is here for evaluation of right toe swelling and redness and pain. Symptoms started one 2 weeks ago and have been progressively getting worse. Patient states that for the past to 3 days he's been having increasing generalized weakness and trouble walking due to feeling weak all over. His sugar was also found to be significantly elevated.   Denies fevers. Denies nausea or vomiting, cough or chest pain.  No exacerbating or alleviating factors.    Past Medical History  Diagnosis Date  . Diabetes mellitus without complication (HCC)   . Stented coronary artery   . Myocardial infarct (HCC)   . Hypertension   . Coronary artery disease     There are no active problems to display for this patient.   History reviewed. No pertinent past surgical history.  Current Outpatient Rx  Name  Route  Sig  Dispense  Refill  . aspirin EC 81 MG tablet   Oral   Take 81 mg by mouth daily.         Marland Kitchen atorvastatin (LIPITOR) 10 MG tablet   Oral   Take 10 mg by mouth at bedtime.         . furosemide (LASIX) 40 MG tablet   Oral   Take 40 mg by mouth daily.         Marland Kitchen gabapentin (NEURONTIN) 600 MG tablet   Oral   Take 600 mg by mouth 3 (three) times daily.         Marland Kitchen glipiZIDE (GLUCOTROL) 10 MG tablet   Oral   Take 10 mg by mouth 2 (two) times daily before a meal.         . losartan (COZAAR) 100 MG tablet   Oral   Take 100 mg by mouth daily.         . metFORMIN (GLUCOPHAGE) 500 MG tablet   Oral   Take 1,000 mg by mouth 2 (two) times daily.         Marland Kitchen omeprazole (PRILOSEC) 20 MG capsule    Oral   Take 20 mg by mouth daily.         . pioglitazone (ACTOS) 30 MG tablet   Oral   Take 30 mg by mouth daily.         . pregabalin (LYRICA) 75 MG capsule   Oral   Take 75 mg by mouth 2 (two) times daily.           Allergies Review of patient's allergies indicates no known allergies.  History reviewed. No pertinent family history.  Social History Social History  Substance Use Topics  . Smoking status: Never Smoker   . Smokeless tobacco: None  . Alcohol Use: No    Review of Systems  Constitutional: Negative for fever. Eyes: Negative for visual changes. ENT: Negative for sore throat. Cardiovascular: Negative for chest pain. Respiratory: Negative for shortness of breath. Gastrointestinal: Negative for abdominal pain, vomiting and diarrhea. Genitourinary: Negative for dysuria. Musculoskeletal: Negative for back pain. Skin: Right great toe redness Neurological: Negative for headache. 10 point Review of Systems otherwise negative ____________________________________________   PHYSICAL EXAM:  VITAL SIGNS: ED Triage Vitals  Enc Vitals  Group     BP 10/30/15 1246 169/105 mmHg     Pulse Rate 10/30/15 1246 115     Resp 10/30/15 1246 18     Temp 10/30/15 1246 98.2 F (36.8 C)     Temp src --      SpO2 10/30/15 1246 97 %     Weight 10/30/15 1246 170 lb (77.111 kg)     Height 10/30/15 1246 5\' 8"  (1.727 m)     Head Cir --      Peak Flow --      Pain Score 10/30/15 1253 0     Pain Loc --      Pain Edu? --      Excl. in GC? --      Constitutional: Alert and oriented. Well appearing and in no distress. Unkept. Eyes: Conjunctivae are normal. PERRL. Normal extraocular movements. ENT   Head: Normocephalic and atraumatic.   Nose: No congestion/rhinnorhea.   Mouth/Throat: Mucous membranes are moist.   Neck: No stridor. Cardiovascular/Chest: Normal rate, regular rhythm.  No murmurs, rubs, or gallops. Respiratory: Normal respiratory effort without  tachypnea nor retractions. Breath sounds are clear and equal bilaterally. No wheezes/rales/rhonchi. Gastrointestinal: Soft. No distention, no guarding, no rebound. Nontender   Genitourinary/rectal:Deferred Musculoskeletal: Right great toe red and swollen and tender with small necrotic area at the tip. Swelling/edema to the entire right foot up to mid calf. Neurologic:  Normal speech and language. No gross or focal neurologic deficits are appreciated. Skin:  Skin is warm, dry and intact. No rash noted. Psychiatric: Mood and affect are normal. Speech and behavior are normal. Patient exhibits appropriate insight and judgment.  ____________________________________________   EKG I, Governor Rooksebecca London Tarnowski, MD, the attending physician have personally viewed and interpreted all ECGs.  112 bpm. Sinus tachycardic. Nonspecific intraventricular conduction delay. Left axis deviation. Nonspecific T-wave ____________________________________________  LABS (pertinent positives/negatives)  Troponin 0.33 Comprehensive metabolic panel significant for glucose 395 and otherwise without significant abnormalities. BUN and creatinine are normal at 18 and 0.91 White blood count 12.0 with left shift. Hemoglobin 13.5 and platelet count 27 BNP 1240  ____________________________________________  RADIOLOGY All Xrays were viewed by me. Imaging interpreted by Radiologist.   Right great toe:  IMPRESSION: No evidence of fracture or subluxation.  No definitive radiographic evidence of osteomyelitis.  Heterogeneous appearance of the bony matrix of the first digit with uncertain significance.     Chest x-ray portable: No acute disease      CT head noncontrast: No acute intracranial process by noncontrast CT. __________________________________________  PROCEDURES  Procedure(s) performed: None  Critical Care performed: CRITICAL CARE Performed by: Governor RooksLORD, Brandyn Thien   Total critical care time: 30  minutes  Critical care time was exclusive of separately billable procedures and treating other patients.  Critical care was necessary to treat or prevent imminent or life-threatening deterioration.  Critical care was time spent personally by me on the following activities: development of treatment plan with patient and/or surrogate as well as nursing, discussions with consultants, evaluation of patient's response to treatment, examination of patient, obtaining history from patient or surrogate, ordering and performing treatments and interventions, ordering and review of laboratory studies, ordering and review of radiographic studies, pulse oximetry and re-evaluation of patient's condition.   ____________________________________________   ED COURSE / ASSESSMENT AND PLAN  CONSULTATIONS: Hospitalist for admission.  I spoke with the ophthalmologist Dr. Duke Salviaandolph, and discussed the 3 weeks of decreased/blurry vision in the right eye with findings of dark and unable to visualize the retina  on funduscopic exam, and she felt this was likely vitreous hemorrhage, and recommended no additional emergency treatment, and recommended outpatient evaluation at [facility] when patient discharged from the hospital.   Pertinent labs & imaging results that were available during my care of the patient were reviewed by me and considered in my medical decision making (see chart for details).   Patient arrived here for evaluation of right great toe swelling, and there is a clear infection/cellulitis. Somewhat concerned for diabetic foot ulcer or even ostial myelitis. X-ray shows no evidence of osteomyelitis. Patient does have an elevated white blood cell count and some systemic symptoms, so am covering him with vancomycin and Zosyn.  While patient was in the emergency department he did drop his oxygen saturation to the low 80s on room air. He does not wear home O2. He does have a history of CHF, as he is on Lasix. He  does look clinically volume overloaded, and his BNP is elevated. Patient was given IV Lasix for treatment of acute congestive heart failure exacerbation.  History troponin was minimally elevated.Time spent discussing and treating acute congestive heart failure with IV medications.  While certainly to admit the patient to the hospitalist, patient's sister-in-law let me know that the patient told her that about 3 weeks ago he had decreased vision/loss of vision.  On funduscopic exam, left eye is normal, right eye I can't visualize that the retina. I discussed this with the on-call ophthalmologist, Dr. Duke Salvia, who suspects sutures hemorrhage, and does not recommend any other additional emergency treatment given this is been ongoing now for over 3 weeks. She took his name and will follow up to have him seen in the office with his discharge from the hospital.  Patient / Family / Caregiver informed of clinical course, medical decision-making process, and agree with plan.   ___________________________________________   FINAL CLINICAL IMPRESSION(S) / ED DIAGNOSES   Final diagnoses:  Cellulitis of right foot  Hyperglycemia  Troponin I above reference range  Acute congestive heart failure, unspecified congestive heart failure type (HCC)       Governor Rooks, MD 10/30/15 2036

## 2015-10-30 NOTE — Progress Notes (Addendum)
Pts sugar was 381- will notify MD for ssi.  [email or web address]- MD, Dr. Anne HahnWillis to place order for SSI- with dose at bedtime Louis MeckelMcRae, Trew Sunde H

## 2015-10-30 NOTE — Progress Notes (Signed)
ANTIBIOTIC CONSULT NOTE - INITIAL  Pharmacy Consult for vancomycin Indication: cellulitis  No Known Allergies  Patient Measurements: Height: 5\' 8"  (172.7 cm) Weight: 170 lb (77.111 kg) IBW/kg (Calculated) : 68.4 Adjusted Body Weight: 71.9 kg  Vital Signs: Temp: 98.2 F (36.8 C) (12/25 1246) BP: 157/92 mmHg (12/25 1950) Pulse Rate: 100 (12/25 1950) Intake/Output from previous day:   Intake/Output from this shift:    Labs:  Recent Labs  10/30/15 1424 10/30/15 1732  WBC  --  12.0*  HGB  --  13.5  PLT  --  207  CREATININE 0.91  --    Estimated Creatinine Clearance: 80.4 mL/min (by C-G formula based on Cr of 0.91). No results for input(s): VANCOTROUGH, VANCOPEAK, VANCORANDOM, GENTTROUGH, GENTPEAK, GENTRANDOM, TOBRATROUGH, TOBRAPEAK, TOBRARND, AMIKACINPEAK, AMIKACINTROU, AMIKACIN in the last 72 hours.   Microbiology: No results found for this or any previous visit (from the past 720 hour(s)).  Medical History: Past Medical History  Diagnosis Date  . Diabetes mellitus without complication (HCC)   . Stented coronary artery   . Myocardial infarct (HCC)   . Hypertension   . Coronary artery disease     Medications:  Infusions:  . heparin 12 Units/kg/hr (10/30/15 1807)   Assessment: 64 yom cc weakness and hyperglycemia, hx diabetes, here for evaluation of right toe swelling/redness/pain x 1 week progressively worsening. Having trouble walking due to weakness, glucose significantly elevated. Concern for diabetic wound, but pharmacy consulted to dose vancomycin for cellulitis - no evidence of osteomyelitis on xray.  Vd 50.3 L, Ke 0.075 hr-1, T1/2 9.3 hr  Goal of Therapy:  Vancomycin trough level 10-15 mcg/ml  Plan:  Expected duration 5 days with resolution of temperature and/or normalization of WBC. Vancomycin 1 gm IV Q12H with stacked dosing, second dose approximately 10 hours after first, predicted trough 15 mcg/mL. Pharmacy will continue to follow and adjust as  needed to maintain therapeutic trough.  Carola FrostNathan A Arnold Kester, Pharm.D., BCPS Clinical Pharmacist 10/30/2015,9:45 PM

## 2015-10-30 NOTE — H&P (Signed)
[facility] - [place] [facility]   PATIENT NAME: Daniel [name]    MR#:  [id number]  DATE OF BIRTH:  1951-06-28  DATE OF ADMISSION:  10/30/2015  PRIMARY CARE PHYSICIAN: [name],[name], MD   REQUESTING/REFERRING PHYSICIAN: Dr. Shaune Pollack  CHIEF COMPLAINT:  Shortness of breath, chest tightness, right toe redness, left eye blindness and lower extremity weakness   HISTORY OF PRESENT ILLNESS:  Daniel [name]  is a 64 y.o. male with a known history of diver [name], coronary artery disease, essential hypertension and multiple other medical problems is presenting to the ED with multiple complaints as reported in the chief complaint. Patient is having right toe swelling associated with redness and pain. Reporting shortness of breath with  chest tightness for 2 days. Troponin is at 0.3 3J patient was found to be hypoxic at 84%. Reporting left eye blindness for 3 weeks and progressively worsening of bilateral lower extremities. Denies any upper extended weakness. Denies any speech difficulties or headache or swallowing difficulties.  PAST MEDICAL HISTORY:   Past Medical History  Diagnosis Date  . Diabetes mellitus without complication (HCC)   . Stented coronary artery   . Myocardial infarct (HCC)   . Hypertension   . Coronary artery disease     PAST SURGICAL HISTOIRY:  History reviewed. No pertinent past surgical history.  SOCIAL HISTORY:   Social History  Substance Use Topics  . Smoking status: Never Smoker   . Smokeless tobacco: Not on file  . Alcohol Use: No    FAMILY HISTORY:  History reviewed. No pertinent family history.  DRUG ALLERGIES:  No Known Allergies  REVIEW OF SYSTEMS:  CONSTITUTIONAL: No fever, fatigue , reporting bilateral lower leg weakness.  EYES: Left eye blindness for 3 weeks EARS, NOSE, AND THROAT: No tinnitus or ear pain.  RESPIRATORY: No cough, reports shortness of breath, denies wheezing or hemoptysis.  CARDIOVASCULAR:  States chest tightness, denies orthopnea, edema.  GASTROINTESTINAL: No nausea, vomiting, diarrhea or abdominal pain.  GENITOURINARY: No dysuria, hematuria.  ENDOCRINE: No polyuria, nocturia,  HEMATOLOGY: No anemia, easy bruising or bleeding SKIN: No rash or lesion. MUSCULOSKELETAL: Right great toe redness and pain  NEUROLOGIC: No tingling, numbness, reporting bilateral lower leg weakness. No upper extremity weakness. Denies any dizziness, headache or swallowing difficulties PSYCHIATRY: No anxiety or depression.   MEDICATIONS AT HOME:   Prior to Admission medications   Medication Sig Start Date End Date Taking? Authorizing Provider  aspirin EC 81 MG tablet Take 81 mg by mouth daily.   Yes Historical Provider, MD  atorvastatin (LIPITOR) 10 MG tablet Take 10 mg by mouth at bedtime.   Yes Historical Provider, MD  furosemide (LASIX) 40 MG tablet Take 40 mg by mouth daily.   Yes Historical Provider, MD  gabapentin (NEURONTIN) 600 MG tablet Take 600 mg by mouth 3 (three) times daily.   Yes Historical Provider, MD  glipiZIDE (GLUCOTROL) 10 MG tablet Take 10 mg by mouth 2 (two) times daily before a meal.   Yes Historical Provider, MD  losartan (COZAAR) 100 MG tablet Take 100 mg by mouth daily.   Yes Historical Provider, MD  metFORMIN (GLUCOPHAGE) 500 MG tablet Take 1,000 mg by mouth 2 (two) times daily.   Yes Historical Provider, MD  omeprazole (PRILOSEC) 20 MG capsule Take 20 mg by mouth daily.   Yes Historical Provider, MD  pioglitazone (ACTOS) 30 MG tablet Take 30 mg by mouth daily.   Yes Historical Provider, MD  pregabalin (LYRICA) 75 MG capsule  Take 75 mg by mouth 2 (two) times daily.   Yes Historical Provider, MD      VITAL SIGNS:  Blood pressure 157/94, pulse 109, temperature 98.2 F (36.8 C), resp. rate 18, height 5\' 8"  (1.727 m), weight 77.111 kg (170 lb), SpO2 96 %.  PHYSICAL EXAMINATION:  GENERAL:  64 y.o.-year-old patient lying in the bed with no acute distress.  EYES: Left eye is  blind. No scleral icterus.  HEENT: Head atraumatic, normocephalic. Oropharynx and nasopharynx clear.  NECK:  Supple, no jugular venous distention. No thyroid enlargement, no tenderness.  LUNGS: Moderate breath sounds bilaterally with rales and rhonchi, no wheezing. No use of accessory muscles of respiration.  CARDIOVASCULAR: S1, S2 normal. No murmurs, rubs, or gallops.  ABDOMEN: Soft, nontender, nondistended. Bowel sounds present. No organomegaly or mass.  EXTREMITIES: Right great toe is erythematous and tender. No pedal edema, cyanosis, or clubbing.  NEUROLOGIC: Left eye is blind. Bilateral upper extremity motor is 5 out of 5 in bilateral lower extremity motor 3-4 out of .Sensation intact. Gait not checked.  PSYCHIATRIC: The patient is alert and oriented x 3.  SKIN: No obvious rash, lesion, or ulcer.   LABORATORY PANEL:   CBC  Recent Labs Lab 10/30/15 1732  WBC 12.0*  HGB 13.5  HCT 40.3  PLT 207   ------------------------------------------------------------------------------------------------------------------  Chemistries   Recent Labs Lab 10/30/15 1424  NA 139  K 3.6  CL 99*  CO2 25  GLUCOSE 395*  BUN 18  CREATININE 0.91  CALCIUM 9.0  AST 23  ALT 17  ALKPHOS 97  BILITOT 1.3*   ------------------------------------------------------------------------------------------------------------------  Cardiac Enzymes  Recent Labs Lab 10/30/15 1424  TROPONINI 0.33*   ------------------------------------------------------------------------------------------------------------------  RADIOLOGY:  Ct Head Wo Contrast  10/30/2015  CLINICAL DATA:  Generalized weakness, diabetes, hypertension, right visual disturbance EXAM: CT HEAD WITHOUT CONTRAST TECHNIQUE: Contiguous axial images were obtained from the base of the skull through the vertex without contrast. COMPARISON:  03/01/2011 FINDINGS: Mild brain atrophy and chronic white matter microvascular ischemic changes throughout  the periventricular white matter. These changes have slightly progressed. No acute intracranial hemorrhage, mass lesion, definite infarction, midline shift, herniation, hydrocephalus, or extra-axial fluid collection. Cisterns patent. No cerebellar abnormality. Skull intact. Mastoids and sinuses remain clear. Atherosclerosis of the intracranial vessels. IMPRESSION: Atrophy and slight progression of chronic white matter microvascular ischemic changes. No acute intracranial process by noncontrast CT. Electronically Signed   By: Judie PetitM.  Shick M.D.   On: 10/30/2015 18:15   Dg Chest Port 1 View  10/30/2015  CLINICAL DATA:  Generalized weakness and elevated blood glucose today. EXAM: PORTABLE CHEST 1 VIEW COMPARISON:  CT chest 04/27/2015. PA and lateral chest 04/27/2015 and 01/22/2015. FINDINGS: Heart size is normal. The lungs are clear. No pneumothorax or pleural effusion. IMPRESSION: No acute disease. Electronically Signed   By: Drusilla Kannerhomas  Dalessio M.D.   On: 10/30/2015 15:14   Dg Toe Great Right  10/30/2015  CLINICAL DATA:  Right toe swelling for 2 weeks.  Diabetic patient. EXAM: RIGHT GREAT TOE COMPARISON:  None. FINDINGS: There is no evidence of fracture or dislocation. There is no evidence of cortical erosions to suggest osteomyelitis radiographically. Heterogeneous appearance of the bony matrix of the first phalanx is noted, with uncertain significance. There is soft tissue swelling of the dorsal and palmar aspect of the mid and forefoot. IMPRESSION: No evidence of fracture or subluxation. No definitive radiographic evidence of osteomyelitis. Heterogeneous appearance of the bony matrix of the first digit with uncertain significance. Electronically Signed  By: Ted Mcalpine M.D.   On: 10/30/2015 13:51    EKG:   Orders placed or performed during the hospital encounter of 04/27/15  . ED EKG (<25mins upon arrival to the ED)  . ED EKG (<87mins upon arrival to the ED)  . EKG    IMPRESSION AND PLAN:    1. Unstable angina/non-STEMI Admit to telemetry and patient is on heparin drip Cycle cardiac biomarkers Nothing by mouth after midnight Consult is placed to [facility]  cardiology and discussed with Dr. Juliann Pares he is aware Provide nitroglycerin as needed for chest pain Continue aspirin, beta blocker and statin  2. Acute hypoxic respiratory failure secondary to acute exacerbation of CHF/unstable angina Lasix IV every 12 hours Monitor daily weights and intake and output Get echocardiogram Continue aspirin, beta blocker and statin  3. Right great toe cellulitis Provide IV antibiotics  4. Progressively worsening bilateral lower extremity weakness-. Unlikely stroke CT head is negative Continue aspirin and statin and get MRI of the brain and echocardiogram Neurology consult is placed  5. 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 Shaune Pollack has discussed with on-call ophthalmologist Dr. Duke Salvia, who has recommended outpatient follow-up after discharge  6. Diabetes mellitus Sliding scale insulin   All the records are reviewed and case discussed with ED provider. Management plans discussed with the patient, family and they are in agreement.  CODE STATUS: Full code, brother is the healthcare power of attorney  TOTAL CRITICAL CARE TIME TAKING CARE OF THIS PATIENT: 50 minutes.    Ramonita Lab M.D on 10/30/2015 at 10:36 PM  Between 7am to 6pm - Pager - [phone]  After 6pm go to [email or web address] - password [email or web address] [facility]  [place] [facility]  Office  [phone]  CC: Primary care physician; Marguarite Arbour, MD

## 2015-10-31 ENCOUNTER — Inpatient Hospital Stay
Admit: 2015-10-31 | Discharge: 2015-10-31 | Disposition: A | Discharge status: Home / Self Care | Payer: Commercial Managed Care - HMO | Attending: Internal Medicine | Admitting: Internal Medicine

## 2015-10-31 DIAGNOSIS — R531 Weakness: Secondary | ICD-10-CM

## 2015-10-31 LAB — GLUCOSE, CAPILLARY
GLUCOSE-CAPILLARY: 198 mg/dL — AB (ref 65–99)
GLUCOSE-CAPILLARY: 174 mg/dL — AB (ref 65–99)
Glucose-Capillary: 198 mg/dL — ABNORMAL HIGH (ref 65–99)

## 2015-10-31 LAB — CBC WITH DIFFERENTIAL/PLATELET
BASOS ABS: 0 10*3/uL (ref 0–0.1)
Basophils Relative: 0 %
EOS ABS: 0 10*3/uL (ref 0–0.7)
EOS PCT: 0 %
HCT: 36.8 % — ABNORMAL LOW (ref 40.0–52.0)
Hemoglobin: 12.5 g/dL — ABNORMAL LOW (ref 13.0–18.0)
Lymphocytes Relative: 13 %
Lymphs Abs: 1.4 10*3/uL (ref 1.0–3.6)
MCH: 30.2 pg (ref 26.0–34.0)
MCHC: 34 g/dL (ref 32.0–36.0)
MCV: 89 fL (ref 80.0–100.0)
Monocytes Absolute: 1.3 10*3/uL — ABNORMAL HIGH (ref 0.2–1.0)
Monocytes Relative: 13 %
Neutro Abs: 7.6 10*3/uL — ABNORMAL HIGH (ref 1.4–6.5)
Neutrophils Relative %: 74 %
PLATELETS: 184 10*3/uL (ref 150–440)
RBC: 4.13 MIL/uL — AB (ref 4.40–5.90)
RDW: 14.5 % (ref 11.5–14.5)
WBC: 10.3 10*3/uL (ref 3.8–10.6)

## 2015-10-31 LAB — BLOOD CULTURE ID PANEL (REFLEXED)
ACINETOBACTER BAUMANNII: NOT DETECTED
CANDIDA ALBICANS: NOT DETECTED
CANDIDA GLABRATA: NOT DETECTED
CANDIDA KRUSEI: NOT DETECTED
CANDIDA PARAPSILOSIS: NOT DETECTED
CANDIDA TROPICALIS: NOT DETECTED
Carbapenem resistance: NOT DETECTED
ENTEROBACTER CLOACAE COMPLEX: NOT DETECTED
ENTEROBACTERIACEAE SPECIES: NOT DETECTED
ESCHERICHIA COLI: NOT DETECTED
Enterococcus species: NOT DETECTED
Haemophilus influenzae: NOT DETECTED
KLEBSIELLA OXYTOCA: NOT DETECTED
KLEBSIELLA PNEUMONIAE: NOT DETECTED
Listeria monocytogenes: NOT DETECTED
Methicillin resistance: NOT DETECTED
Neisseria meningitidis: NOT DETECTED
Proteus species: NOT DETECTED
Pseudomonas aeruginosa: NOT DETECTED
STAPHYLOCOCCUS SPECIES: NOT DETECTED
STREPTOCOCCUS AGALACTIAE: NOT DETECTED
STREPTOCOCCUS PNEUMONIAE: NOT DETECTED
STREPTOCOCCUS PYOGENES: NOT DETECTED
Serratia marcescens: NOT DETECTED
Staphylococcus aureus (BCID): NOT DETECTED
Streptococcus species: DETECTED — AB
Vancomycin resistance: NOT DETECTED

## 2015-10-31 LAB — TSH: TSH: 1.595 u[IU]/mL (ref 0.350–4.500)

## 2015-10-31 LAB — BASIC METABOLIC PANEL
Anion gap: 9 (ref 5–15)
BUN: 27 mg/dL — AB (ref 6–20)
CALCIUM: 8.1 mg/dL — AB (ref 8.9–10.3)
CO2: 28 mmol/L (ref 22–32)
CREATININE: 1.49 mg/dL — AB (ref 0.61–1.24)
Chloride: 101 mmol/L (ref 101–111)
GFR calc Af Amer: 56 mL/min — ABNORMAL LOW (ref >60)
GFR, EST NON AFRICAN AMERICAN: 48 mL/min — AB (ref >60)
GLUCOSE: 215 mg/dL — AB (ref 65–99)
Potassium: 3.8 mmol/L (ref 3.5–5.1)
SODIUM: 138 mmol/L (ref 135–145)

## 2015-10-31 LAB — TROPONIN I
Troponin I: 0.82 ng/mL — ABNORMAL HIGH (ref <0.031)
Troponin I: 0.71 ng/mL — ABNORMAL HIGH (ref <0.031)

## 2015-10-31 LAB — PROTIME-INR
INR: 1.23
Prothrombin Time: 15.7 seconds — ABNORMAL HIGH (ref 11.4–15.0)

## 2015-10-31 LAB — HEPARIN LEVEL (UNFRACTIONATED)
HEPARIN UNFRACTIONATED: 0.12 [IU]/mL — AB (ref 0.30–0.70)
Heparin Unfractionated: 0.31 IU/mL (ref 0.30–0.70)

## 2015-10-31 LAB — HEMOGLOBIN A1C: HEMOGLOBIN A1C: 12.2 % — AB (ref 4.0–6.0)

## 2015-10-31 MED ORDER — PIOGLITAZONE HCL 30 MG PO TABS
30.0000 mg | ORAL_TABLET | Freq: Every day | ORAL | Status: DC
  Administered 2015-10-31: 30 mg via ORAL
  Filled 2015-10-31: qty 1

## 2015-10-31 MED ORDER — GLIPIZIDE 5 MG PO TABS
10.0000 mg | ORAL_TABLET | Freq: Two times a day (BID) | ORAL | Status: DC
  Administered 2015-10-31 – 2015-11-14 (×22): 10 mg via ORAL
  Filled 2015-10-31 (×6): qty 1
  Filled 2015-10-31 (×2): qty 2
  Filled 2015-10-31 (×4): qty 1
  Filled 2015-10-31: qty 2
  Filled 2015-10-31 (×4): qty 1
  Filled 2015-10-31: qty 2
  Filled 2015-10-31 (×6): qty 1

## 2015-10-31 MED ORDER — HEPARIN BOLUS VIA INFUSION
2300.0000 [IU] | Freq: Once | INTRAVENOUS | Status: AC
  Administered 2015-10-31: 2300 [IU] via INTRAVENOUS
  Filled 2015-10-31: qty 2300

## 2015-10-31 MED ORDER — INSULIN ASPART 100 UNIT/ML ~~LOC~~ SOLN
0.0000 [IU] | Freq: Three times a day (TID) | SUBCUTANEOUS | Status: DC
  Administered 2015-10-31: 9 [IU] via SUBCUTANEOUS
  Administered 2015-10-31 (×2): 2 [IU] via SUBCUTANEOUS
  Administered 2015-10-31: 3 [IU] via SUBCUTANEOUS
  Administered 2015-10-31: 2 [IU] via SUBCUTANEOUS
  Administered 2015-11-01: 5 [IU] via SUBCUTANEOUS
  Administered 2015-11-01: 7 [IU] via SUBCUTANEOUS
  Administered 2015-11-01: 3 [IU] via SUBCUTANEOUS
  Administered 2015-11-02: 9 [IU] via SUBCUTANEOUS
  Administered 2015-11-02: 1 [IU] via SUBCUTANEOUS
  Administered 2015-11-02: 3 [IU] via SUBCUTANEOUS
  Administered 2015-11-02 – 2015-11-03 (×4): 2 [IU] via SUBCUTANEOUS
  Administered 2015-11-05: 3 [IU] via SUBCUTANEOUS
  Administered 2015-11-05: 2 [IU] via SUBCUTANEOUS
  Administered 2015-11-05: 3 [IU] via SUBCUTANEOUS
  Administered 2015-11-06: 5 [IU] via SUBCUTANEOUS
  Administered 2015-11-06 (×2): 3 [IU] via SUBCUTANEOUS
  Administered 2015-11-07 (×3): 2 [IU] via SUBCUTANEOUS
  Administered 2015-11-08: 3 [IU] via SUBCUTANEOUS
  Administered 2015-11-08: 1 [IU] via SUBCUTANEOUS
  Administered 2015-11-09 (×2): 2 [IU] via SUBCUTANEOUS
  Administered 2015-11-09 – 2015-11-10 (×3): 3 [IU] via SUBCUTANEOUS
  Administered 2015-11-10 (×2): 1 [IU] via SUBCUTANEOUS
  Administered 2015-11-10: 3 [IU] via SUBCUTANEOUS
  Administered 2015-11-11: 7 [IU] via SUBCUTANEOUS
  Administered 2015-11-12 (×4): 2 [IU] via SUBCUTANEOUS
  Administered 2015-11-13: 3 [IU] via SUBCUTANEOUS
  Administered 2015-11-13: 2 [IU] via SUBCUTANEOUS
  Administered 2015-11-14: 1 [IU] via SUBCUTANEOUS
  Filled 2015-10-31 (×2): qty 2
  Filled 2015-10-31 (×2): qty 3
  Filled 2015-10-31: qty 1
  Filled 2015-10-31: qty 7
  Filled 2015-10-31: qty 3
  Filled 2015-10-31: qty 2
  Filled 2015-10-31: qty 1
  Filled 2015-10-31: qty 2
  Filled 2015-10-31: qty 3
  Filled 2015-10-31: qty 2
  Filled 2015-10-31: qty 1
  Filled 2015-10-31: qty 3
  Filled 2015-10-31: qty 2
  Filled 2015-10-31: qty 3
  Filled 2015-10-31: qty 2
  Filled 2015-10-31: qty 9
  Filled 2015-10-31: qty 7
  Filled 2015-10-31 (×4): qty 2
  Filled 2015-10-31: qty 3
  Filled 2015-10-31: qty 5
  Filled 2015-10-31: qty 1
  Filled 2015-10-31: qty 3
  Filled 2015-10-31: qty 2
  Filled 2015-10-31: qty 3
  Filled 2015-10-31: qty 2
  Filled 2015-10-31: qty 3
  Filled 2015-10-31: qty 2
  Filled 2015-10-31: qty 5
  Filled 2015-10-31: qty 3
  Filled 2015-10-31: qty 1
  Filled 2015-10-31 (×2): qty 3
  Filled 2015-10-31 (×2): qty 2
  Filled 2015-10-31: qty 3
  Filled 2015-10-31: qty 2
  Filled 2015-10-31: qty 9

## 2015-10-31 MED ORDER — MORPHINE SULFATE (PF) 2 MG/ML IV SOLN
2.0000 mg | INTRAVENOUS | Status: DC | PRN
  Administered 2015-10-31 – 2015-11-02 (×7): 2 mg via INTRAVENOUS
  Filled 2015-10-31 (×7): qty 1

## 2015-10-31 MED ORDER — HEPARIN (PORCINE) IN NACL 100-0.45 UNIT/ML-% IJ SOLN
1850.0000 [IU]/h | INTRAMUSCULAR | Status: DC
  Administered 2015-10-31: 1450 [IU]/h via INTRAVENOUS
  Administered 2015-10-31: 1150 [IU]/h via INTRAVENOUS
  Administered 2015-11-01 – 2015-11-02 (×3): 1600 [IU]/h via INTRAVENOUS
  Filled 2015-10-31 (×7): qty 250

## 2015-10-31 MED ORDER — ATORVASTATIN CALCIUM 20 MG PO TABS
40.0000 mg | ORAL_TABLET | Freq: Every day | ORAL | Status: DC
Start: 2015-10-31 — End: 2015-11-14
  Administered 2015-10-31 – 2015-11-13 (×14): 40 mg via ORAL
  Filled 2015-10-31 (×14): qty 2

## 2015-10-31 MED ORDER — SODIUM CHLORIDE 0.9 % IV SOLN
3.0000 g | Freq: Four times a day (QID) | INTRAVENOUS | Status: DC
  Administered 2015-10-31 – 2015-11-02 (×9): 3 g via INTRAVENOUS
  Filled 2015-10-31 (×11): qty 3

## 2015-10-31 NOTE — Clinical Social Work Note (Addendum)
Clinical Social Work Assessment  Patient Details  Name: Daniel [name] MRN: [id number] Date of Birth: [birth year]  Date of referral:  10/31/15               Reason for consult:  Discharge Planning                Permission sought to share information with:  Case Manager Permission granted to share information::  Yes, Verbal Permission Granted  Name/ Relationship::   Daniel [name]- Brother [phone])  Contact Information:     Housing/Transportation Living arrangements for the past 2 months:  [place] of Information:  Patient Patient Interpreter Needed:  None Criminal Activity/Legal Involvement Pertinent to Current Situation/Hospitalization:  No - Comment as needed Significant Relationships:  Pets, Siblings (Patient reports that he has a dog name Daniel [name] and has 3 brothers) Lives with:  Self Do you feel safe going back to the place where you live?  Yes Need for family participation in patient care:  Yes (Comment) Daniel [name]- Brother [phone])  Care giving concerns: Patient reports that he experienced weakness in his thighs and fell out of bed. He reports that his brother contacted 911 to get him help.     Social Worker assessment / plan:  CSW was consulted by Marine scientist. CSW met with patient at bedside. Patient was lying in his bed. Patient reports that he lives alone with his dog, Daniel [name]. He reports that he's worried about his dog but his brothers are taking care of her. Patient reports that he is the youngest of 4 brothers. He reports that he currently lives in the home his mother left him when he passed. He reports that he woke up and could not get out of bed. He stated that he fell out of bed because he had no strength in his thighs. Patient reports that he pulled himself into a chair and called his brother. Patient reports that his brother contacted 911 and he was transported to the hospital. Patient stated that his brothers are his  support system and that he could walk yesterday with no assistance. CSW explained the process for SNF placement vs HH and insurance requirements with patient. Patient reports that he wants to return home at discharge. CSW will revisit SNF placement and insurance requirements if SNF is recommended by PT. CSW is awaiting PT eval. CSW will continue to follow and assist as needed.  Employment status:  Retired Forensic scientist:  Medicare PT Recommendations:  Not assessed at this time Information / Referral to community resources:     Patient/Family's Response to care: Patient was receptive to CSW but reports that he would like to return home.   Patient/Family's Understanding of and Emotional Response to Diagnosis, Current Treatment, and Prognosis:  Patient is alert and oriented. Patient reports that he wanted to see what PT would recommend. Patient reports that he's worried about his dog.   Emotional Assessment Appearance:  Appears stated age Attitude/Demeanor/Rapport:   (None) Affect (typically observed):  Accepting, Calm Orientation:  Oriented to Self, Oriented to Place, Oriented to  Time, Oriented to Situation Alcohol / Substance use:  Not Applicable Psych involvement (Current and /or in the community):  No (Comment)  Discharge Needs  Concerns to be addressed:  Discharge Planning Concerns, Care Coordination Readmission within the last 30 days:  No Current discharge risk:  Lives alone Barriers to Discharge:  Continued Medical Work up   [employer], LCSW 10/31/2015, 2:17 PM

## 2015-10-31 NOTE — Progress Notes (Signed)
ANTICOAGULATION CONSULT NOTE - Initial Consult  Pharmacy Consult for heparin drip Indication: chest pain/ACS  No Known Allergies  Patient Measurements: Height: 5\' 8"  (172.7 cm) Weight: 170 lb (77.111 kg) IBW/kg (Calculated) : 68.4 Heparin Dosing Weight: 77.1 kg  Vital Signs: Temp: 98.1 F (36.7 C) (12/25 2249) Temp Source: Oral (12/25 2249) BP: 149/99 mmHg (12/25 2249) Pulse Rate: 100 (12/25 2249)  Labs:  Recent Labs  10/30/15 1331 10/30/15 1424 10/30/15 1732 10/30/15 2253  HGB  --   --  13.5  --   HCT  --   --  40.3  --   PLT  --   --  207  --   APTT 32  --   --   --   LABPROT 14.4  --   --   --   INR 1.10  --   --   --   HEPARINUNFRC  --   --   --  0.10*  CREATININE  --  0.91  --   --   TROPONINI  --  0.33*  --  0.66*    Estimated Creatinine Clearance: 80.4 mL/min (by C-G formula based on Cr of 0.91).   Medical History: Past Medical History  Diagnosis Date  . Diabetes mellitus without complication (HCC)   . Stented coronary artery   . Myocardial infarct (HCC)   . Hypertension   . Coronary artery disease    Assessment: Pharmacy consulted to dose a heparin drip in this 64 year old male for chest pain/ACS. Patient was not taking anticoagulants prior to admission per medication rec. Baseline CBC, protime/INR, and APTT were obtained in ED and are pending.   Heparin dosing weight = 77.1 kg  Goal of Therapy:  Heparin level 0.3-0.7 units/ml Monitor platelets by anticoagulation protocol: Yes   Plan:  Heparin level subtherapeutic. 2300 Units IV x 1 bolus and increase rate to 1150 units/hr. Will recheck level in 6 hours.  Carola FrostNathan A Ilir Mahrt, Pharm.D., BCPS Clinical Pharmacist 10/31/2015,12:11 AM

## 2015-10-31 NOTE — Progress Notes (Signed)
*  PRELIMINARY RESULTS* Echocardiogram 2D Echocardiogram has been performed.  Daniel HousekeeperJerry R [name] 10/31/2015, 2:33 PM

## 2015-10-31 NOTE — Consult Note (Signed)
Reason for Consult: Shortness of breath elevated troponin known coronary disease Referring Physician: Dr. Margaretmary Eddy hospitalist Cardiologist Dr. Loma Boston Daniel [name] is an 64 y.o. male.  HPI: Patient presents with shortness of breath right leg redness this some swelling as well as chest tightness patient has multiple medical problems known chronic disease myocardial infarction Pass PCI and stent hypertension hyperlipidemia diabetes. Patient recently saw Dr. Ubaldo Glassing about a month ago had noninvasive workup but did not return for follow-up. Patient now complains of chest tightness shortness of breath he was hypoxic sats in the 80s troponin was mildly elevated as had leg swelling and ankle edema with redness now presents for further evaluation. Denies any fever chills or sweats no falls nonsmoker  Past Medical History  Diagnosis Date  . Diabetes mellitus without complication ([place])   . Stented coronary artery   . Myocardial infarct ([place])   . Hypertension   . Coronary artery disease     History reviewed. No pertinent past surgical history.  History reviewed. No pertinent family history.  Social History:  reports that he has never smoked. He does not have any smokeless tobacco history on file. He reports that he does not drink alcohol. His drug history is not on file.  Allergies: No Known Allergies  Medications: I have reviewed the patient's current medications.  Results for orders placed or performed during the hospital encounter of 10/30/15 (from the past 48 hour(s))  APTT     Status: None   Collection Time: 10/30/15  1:31 PM  Result Value Ref Range   aPTT 32 24 - 36 seconds  Protime-INR     Status: None   Collection Time: 10/30/15  1:31 PM  Result Value Ref Range   Prothrombin Time 14.4 11.4 - 15.0 seconds   INR 1.10   Comprehensive metabolic panel     Status: Abnormal   Collection Time: 10/30/15  2:24 PM  Result Value Ref Range   Sodium 139 135 - 145 mmol/L   Potassium 3.6 3.5 - 5.1  mmol/L   Chloride 99 (L) 101 - 111 mmol/L   CO2 25 22 - 32 mmol/L   Glucose, Bld 395 (H) 65 - 99 mg/dL   BUN 18 6 - 20 mg/dL   Creatinine, Ser 0.91 0.61 - 1.24 mg/dL   Calcium 9.0 8.9 - 10.3 mg/dL   Total Protein 7.6 6.5 - 8.1 g/dL   Albumin 4.1 3.5 - 5.0 g/dL   AST 23 15 - 41 U/L   ALT 17 17 - 63 U/L   Alkaline Phosphatase 97 38 - 126 U/L   Total Bilirubin 1.3 (H) 0.3 - 1.2 mg/dL   GFR calc non Af Amer >60 >60 mL/min   GFR calc Af Amer >60 >60 mL/min    Comment: (NOTE) The eGFR has been calculated using the CKD EPI equation. This calculation has not been validated in all clinical situations. eGFR's persistently <60 mL/min signify possible Chronic Kidney Disease.    Anion gap 15 5 - 15  Troponin I     Status: Abnormal   Collection Time: 10/30/15  2:24 PM  Result Value Ref Range   Troponin I 0.33 (H) <0.031 ng/mL    Comment: READ BACK AND VERIFIED WITH JENNIFER  WHITLEY AT 0263 10/30/15 SDR        PERSISTENTLY INCREASED TROPONIN VALUES IN THE RANGE OF 0.04-0.49 ng/mL CAN BE SEEN IN:       -UNSTABLE ANGINA       -CONGESTIVE HEART FAILURE       -  MYOCARDITIS       -CHEST TRAUMA       -ARRYHTHMIAS       -LATE PRESENTING MYOCARDIAL INFARCTION       -COPD   CLINICAL FOLLOW-UP RECOMMENDED.   Urinalysis complete, with microscopic     Status: Abnormal   Collection Time: 10/30/15  3:41 PM  Result Value Ref Range   Color, Urine STRAW (A) YELLOW   APPearance CLEAR (A) CLEAR   Glucose, UA >500 (A) NEGATIVE mg/dL   Bilirubin Urine NEGATIVE NEGATIVE   Ketones, ur 1+ (A) NEGATIVE mg/dL   Specific Gravity, Urine 1.012 1.005 - 1.030   Hgb urine dipstick 2+ (A) NEGATIVE   pH 5.0 5.0 - 8.0   Protein, ur 100 (A) NEGATIVE mg/dL   Nitrite NEGATIVE NEGATIVE   Leukocytes, UA NEGATIVE NEGATIVE   RBC / HPF 0-5 0 - 5 RBC/hpf   WBC, UA 0-5 0 - 5 WBC/hpf   Bacteria, UA NONE SEEN NONE SEEN   Squamous Epithelial / LPF NONE SEEN NONE SEEN  Culture, blood (routine x 2)     Status: None  (Preliminary result)   Collection Time: 10/30/15  5:31 PM  Result Value Ref Range   Specimen Description BLOOD LEFT HAND    Special Requests BOTTLES DRAWN AEROBIC AND ANAEROBIC  2CC    Culture  Setup Time      GRAM POSITIVE COCCI IN CHAINS IN BOTH AEROBIC AND ANAEROBIC BOTTLES CRITICAL RESULT CALLED TO, READ BACK BY AND VERIFIED WITH: KAREN HAYES AT 9935 10/31/15 CTJ    Culture      STREPTOCOCCUS SPECIES IN BOTH AEROBIC AND ANAEROBIC BOTTLES    Report Status PENDING   Blood Culture ID Panel (Reflexed)     Status: Abnormal   Collection Time: 10/30/15  5:31 PM  Result Value Ref Range   Enterococcus species NOT DETECTED NOT DETECTED   Listeria monocytogenes NOT DETECTED NOT DETECTED   Staphylococcus species NOT DETECTED NOT DETECTED   Staphylococcus aureus NOT DETECTED NOT DETECTED   Streptococcus species DETECTED (A) NOT DETECTED    Comment: CRITICAL RESULT CALLED TO, READ BACK BY AND VERIFIED WITH: KAREN HAYES AT 1045 ON 10/31/15 CTJ    Streptococcus agalactiae NOT DETECTED NOT DETECTED   Streptococcus pneumoniae NOT DETECTED NOT DETECTED   Streptococcus pyogenes NOT DETECTED NOT DETECTED   Acinetobacter baumannii NOT DETECTED NOT DETECTED   Enterobacteriaceae species NOT DETECTED NOT DETECTED   Enterobacter cloacae complex NOT DETECTED NOT DETECTED   Escherichia coli NOT DETECTED NOT DETECTED   Klebsiella oxytoca NOT DETECTED NOT DETECTED   Klebsiella pneumoniae NOT DETECTED NOT DETECTED   Proteus species NOT DETECTED NOT DETECTED   Serratia marcescens NOT DETECTED NOT DETECTED   Haemophilus influenzae NOT DETECTED NOT DETECTED   Neisseria meningitidis NOT DETECTED NOT DETECTED   Pseudomonas aeruginosa NOT DETECTED NOT DETECTED   Candida albicans NOT DETECTED NOT DETECTED   Candida glabrata NOT DETECTED NOT DETECTED   Candida krusei NOT DETECTED NOT DETECTED   Candida parapsilosis NOT DETECTED NOT DETECTED   Candida tropicalis NOT DETECTED NOT DETECTED   Carbapenem  resistance NOT DETECTED NOT DETECTED   Methicillin resistance NOT DETECTED NOT DETECTED   Vancomycin resistance NOT DETECTED NOT DETECTED  Brain natriuretic peptide     Status: Abnormal   Collection Time: 10/30/15  5:32 PM  Result Value Ref Range   B Natriuretic Peptide 1240.0 (H) 0.0 - 100.0 pg/mL  CBC with Differential/Platelet     Status: Abnormal   Collection  Time: 10/30/15  5:32 PM  Result Value Ref Range   WBC 12.0 (H) 3.8 - 10.6 K/uL   RBC 4.44 4.40 - 5.90 MIL/uL   Hemoglobin 13.5 13.0 - 18.0 g/dL   HCT 40.3 40.0 - 52.0 %   MCV 90.8 80.0 - 100.0 fL   MCH 30.4 26.0 - 34.0 pg   MCHC 33.5 32.0 - 36.0 g/dL   RDW 14.2 11.5 - 14.5 %   Platelets 207 150 - 440 K/uL   Neutrophils Relative % 82 %   Neutro Abs 10.0 (H) 1.4 - 6.5 K/uL   Lymphocytes Relative 8 %   Lymphs Abs 0.9 (L) 1.0 - 3.6 K/uL   Monocytes Relative 10 %   Monocytes Absolute 1.1 (H) 0.2 - 1.0 K/uL   Eosinophils Relative 0 %   Eosinophils Absolute 0.0 0 - 0.7 K/uL   Basophils Relative 0 %   Basophils Absolute 0.0 0 - 0.1 K/uL  Heparin level (unfractionated)     Status: Abnormal   Collection Time: 10/30/15 10:53 PM  Result Value Ref Range   Heparin Unfractionated 0.10 (L) 0.30 - 0.70 IU/mL    Comment:        IF HEPARIN RESULTS ARE BELOW EXPECTED VALUES, AND PATIENT DOSAGE HAS BEEN CONFIRMED, SUGGEST FOLLOW UP TESTING OF ANTITHROMBIN III LEVELS.   Troponin I (q 6hr x 3)     Status: Abnormal   Collection Time: 10/30/15 10:53 PM  Result Value Ref Range   Troponin I 0.66 (H) <0.031 ng/mL    Comment: PREVIOUS RESULT CALLED BY SDR AT 1516 ON 10/30/15 RWW        POSSIBLE MYOCARDIAL ISCHEMIA. SERIAL TESTING RECOMMENDED.   Glucose, capillary     Status: Abnormal   Collection Time: 10/30/15 11:28 PM  Result Value Ref Range   Glucose-Capillary 381 (H) 65 - 99 mg/dL  CBC with Differential/Platelet     Status: Abnormal   Collection Time: 10/31/15  5:40 AM  Result Value Ref Range   WBC 10.3 3.8 - 10.6 K/uL    RBC 4.13 (L) 4.40 - 5.90 MIL/uL   Hemoglobin 12.5 (L) 13.0 - 18.0 g/dL   HCT 36.8 (L) 40.0 - 52.0 %   MCV 89.0 80.0 - 100.0 fL   MCH 30.2 26.0 - 34.0 pg   MCHC 34.0 32.0 - 36.0 g/dL   RDW 14.5 11.5 - 14.5 %   Platelets 184 150 - 440 K/uL   Neutrophils Relative % 74 %   Neutro Abs 7.6 (H) 1.4 - 6.5 K/uL   Lymphocytes Relative 13 %   Lymphs Abs 1.4 1.0 - 3.6 K/uL   Monocytes Relative 13 %   Monocytes Absolute 1.3 (H) 0.2 - 1.0 K/uL   Eosinophils Relative 0 %   Eosinophils Absolute 0.0 0 - 0.7 K/uL   Basophils Relative 0 %   Basophils Absolute 0.0 0 - 0.1 K/uL  Troponin I (q 6hr x 3)     Status: Abnormal   Collection Time: 10/31/15  5:40 AM  Result Value Ref Range   Troponin I 0.82 (H) <0.031 ng/mL    Comment: PREVIOUS RESULT CALLED AT 1516 10/30/15.PMH        POSSIBLE MYOCARDIAL ISCHEMIA. SERIAL TESTING RECOMMENDED.   TSH     Status: None   Collection Time: 10/31/15  5:40 AM  Result Value Ref Range   TSH 1.595 0.350 - 4.500 uIU/mL  Basic metabolic panel     Status: Abnormal   Collection Time: 10/31/15  5:40 AM  Result Value Ref Range   Sodium 138 135 - 145 mmol/L   Potassium 3.8 3.5 - 5.1 mmol/L   Chloride 101 101 - 111 mmol/L   CO2 28 22 - 32 mmol/L   Glucose, Bld 215 (H) 65 - 99 mg/dL   BUN 27 (H) 6 - 20 mg/dL   Creatinine, Ser 1.49 (H) 0.61 - 1.24 mg/dL   Calcium 8.1 (L) 8.9 - 10.3 mg/dL   GFR calc non Af Amer 48 (L) >60 mL/min   GFR calc Af Amer 56 (L) >60 mL/min    Comment: (NOTE) The eGFR has been calculated using the CKD EPI equation. This calculation has not been validated in all clinical situations. eGFR's persistently <60 mL/min signify possible Chronic Kidney Disease.    Anion gap 9 5 - 15  Protime-INR     Status: Abnormal   Collection Time: 10/31/15  5:40 AM  Result Value Ref Range   Prothrombin Time 15.7 (H) 11.4 - 15.0 seconds   INR 1.23   Glucose, capillary     Status: Abnormal   Collection Time: 10/31/15 11:57 AM  Result Value Ref Range    Glucose-Capillary 198 (H) 65 - 99 mg/dL   Comment 1 Notify RN     Ct Head Wo Contrast  10/30/2015  CLINICAL DATA:  Generalized weakness, diabetes, hypertension, right visual disturbance EXAM: CT HEAD WITHOUT CONTRAST TECHNIQUE: Contiguous axial images were obtained from the base of the skull through the vertex without contrast. COMPARISON:  03/01/2011 FINDINGS: Mild brain atrophy and chronic white matter microvascular ischemic changes throughout the periventricular white matter. These changes have slightly progressed. No acute intracranial hemorrhage, mass lesion, definite infarction, midline shift, herniation, hydrocephalus, or extra-axial fluid collection. Cisterns patent. No cerebellar abnormality. Skull intact. Mastoids and sinuses remain clear. Atherosclerosis of the intracranial vessels. IMPRESSION: Atrophy and slight progression of chronic white matter microvascular ischemic changes. No acute intracranial process by noncontrast CT. Electronically Signed   By: Jerilynn Mages.  Shick M.D.   On: 10/30/2015 18:15   Dg Chest Port 1 View  10/30/2015  CLINICAL DATA:  Generalized weakness and elevated blood glucose today. EXAM: PORTABLE CHEST 1 VIEW COMPARISON:  CT chest 04/27/2015. PA and lateral chest 04/27/2015 and 01/22/2015. FINDINGS: Heart size is normal. The lungs are clear. No pneumothorax or pleural effusion. IMPRESSION: No acute disease. Electronically Signed   By: Inge Rise M.D.   On: 10/30/2015 15:14   Dg Toe Great Right  10/30/2015  CLINICAL DATA:  Right toe swelling for 2 weeks.  Diabetic patient. EXAM: RIGHT GREAT TOE COMPARISON:  None. FINDINGS: There is no evidence of fracture or dislocation. There is no evidence of cortical erosions to suggest osteomyelitis radiographically. Heterogeneous appearance of the bony matrix of the first phalanx is noted, with uncertain significance. There is soft tissue swelling of the dorsal and palmar aspect of the mid and forefoot. IMPRESSION: No evidence of  fracture or subluxation. No definitive radiographic evidence of osteomyelitis. Heterogeneous appearance of the bony matrix of the first digit with uncertain significance. Electronically Signed   By: Fidela Salisbury M.D.   On: 10/30/2015 13:51    Review of Systems  Constitutional: Positive for malaise/fatigue.  HENT: Positive for congestion.   Eyes: Negative.   Respiratory: Positive for shortness of breath.   Cardiovascular: Positive for chest pain, orthopnea and leg swelling.  Gastrointestinal: Negative.   Genitourinary: Negative.   Musculoskeletal: Positive for myalgias.  Skin: Negative.   Neurological: Negative.   Endo/Heme/Allergies: Negative.    Blood  pressure 113/78, pulse 82, temperature 98.1 F (36.7 C), temperature source Oral, resp. rate 16, height 5' 8"  (1.727 m), weight 81.058 kg (178 lb 11.2 oz), SpO2 97 %. Physical Exam  Nursing note and vitals reviewed. Constitutional: He appears well-developed and well-nourished.  HENT:  Head: Normocephalic and atraumatic.  Eyes: Conjunctivae and EOM are normal.  Neck: Neck supple.  Cardiovascular: Normal rate, S1 normal and S2 normal.  Exam reveals gallop, S3 and S4.   Murmur heard.  Systolic murmur is present with a grade of 2/6  Pulses:      Femoral pulses are 1+ on the right side, and 1+ on the left side.      Popliteal pulses are 1+ on the right side, and 1+ on the left side.  Swelling bilateral ankles possible cellulitis of the foot  Respiratory: Effort normal.  GI: Soft. Bowel sounds are normal.  Musculoskeletal: He exhibits edema.       Right shoulder: He exhibits swelling and abnormal pulse.       Right ankle: Tenderness.    Assessment/Plan: Unstable angina Disease History of myocardial infarction Diabetes type 2 without complication Hypertension Ankle edema Hyperlipidemia GERD Cellulitis of the foot Possible demand ischemia Chronic renal insufficiency . PLAN Agree with telemetry Agree with  supplemental oxygen for hypoxemia DVT prophylaxis short-term anticoagulation Consider echocardiogram for further assessment Continue broad-spectrum antibiotic therapy for possible cellulitis Recommend continue diabetes management control with insulin 4 hypertension continue losartan and metoprolol Continue Lipitor for lipid management Lasix for leg edema as well as losartan Continue aspirin for 2 sclerotic vascular disease Agree with Protonix for GERD symptoms Consider nephrology for renal insufficiency Rule out myocardial infarction Follow-up troponins Continue Lasix therapy. A chest x-ray for possible heart failure Echocardiogram will be helpful for shortness of breath and possible heart failure  Kasara Schomer D. 10/31/2015, 12:30 PM

## 2015-10-31 NOTE — Progress Notes (Signed)
ANTICOAGULATION CONSULT NOTE - Initial Consult  Pharmacy Consult for heparin drip Indication: chest pain/ACS  No Known Allergies  Patient Measurements: Height: 5\' 8"  (172.7 cm) Weight: 178 lb 11.2 oz (81.058 kg) IBW/kg (Calculated) : 68.4 Heparin Dosing Weight: 77.1 kg  Vital Signs: Temp: 98.1 F (36.7 C) (12/26 0514) Temp Source: Oral (12/26 0514) BP: 113/78 mmHg (12/26 1028) Pulse Rate: 82 (12/26 0514)  Labs:  Recent Labs  10/30/15 1331  10/30/15 1424 10/30/15 1732 10/30/15 2253 10/31/15 0540 10/31/15 1135  HGB  --   --   --  13.5  --  12.5*  --   HCT  --   --   --  40.3  --  36.8*  --   PLT  --   --   --  207  --  184  --   APTT 32  --   --   --   --   --   --   LABPROT 14.4  --   --   --   --  15.7*  --   INR 1.10  --   --   --   --  1.23  --   HEPARINUNFRC  --   --   --   --  0.10*  --  0.12*  CREATININE  --   --  0.91  --   --  1.49*  --   TROPONINI  --   < > 0.33*  --  0.66* 0.82* 0.71*  < > = values in this interval not displayed.  Estimated Creatinine Clearance: 49.1 mL/min (by C-G formula based on Cr of 1.49).   Medical History: Past Medical History  Diagnosis Date  . Diabetes mellitus without complication ([facility]CC)   . Stented coronary artery   . Myocardial infarct ([facility])   . Hypertension   . Coronary artery disease    Assessment: Pharmacy consulted to dose a heparin drip in this 64 year old male for chest pain/ACS. Patient was not taking anticoagulants prior to admission per medication rec. Baseline CBC, protime/INR, and APTT were obtained in ED and are pending.   Heparin dosing weight = 77.1 kg  Goal of Therapy:  Heparin level 0.3-0.7 units/ml Monitor platelets by anticoagulation protocol: Yes   Plan:  Heparin level subtherapeutic. 2300 Units IV x 1 bolus and increase rate to 1450 units/hr. Will recheck level in 6 hours.  Olene FlossMelissa D Bleu Moisan, Pharm.D. Clinical Pharmacist 10/31/2015,12:59 PM

## 2015-10-31 NOTE — Consult Note (Signed)
WOC wound consult note Reason for Consult: Right great toe cellulitis.  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  Stage 2 pressure injury to right buttock, pressure and moisture contributing.   Wound type:Infectious  Pressure Ulcer POA: Yes Stage 2 to right buttocks near ischium Measurement: right buttocks 1 cm x 1 cm x 1 0.1 cm pink denuded lesion.  Will protect with silicone border foam dressing.  Right great toe 1 cm x 1 cm scabbed lesion with surrounding erythema and edema to great toe, extending to dorsal foot and metatarsal head.  Treating with Vancomycin.  Moisture and erythema to great toe.  Will add strips of calcium alginate between toes to absorb moisture.  Wound ZOX:WRUEAVWbed:Scabbed great toe lesion Right buttocks pink and moist Drainage (amount, consistency, odor) Minimal serous drainage Periwound:Erythema and edema to right great toe and foot.  Dressing procedure/placement/frequency:Cleanse right great toe with soap and water and pat dry.  Place calcium alginate (ALGISITE) to wound bed and between great toe and second metatarsal to keep dry.  Cover with kerlix and secure with tape.  Change daily.  Silicone border foam to right buttocks lesion.  Will not follow at this time.  Please re-consult if needed.  Maple HudsonKaren Easter Schinke RN BSN CWON Pager [phone]

## 2015-10-31 NOTE — Consult Note (Signed)
ORTHOPAEDIC CONSULTATION  REQUESTING PHYSICIAN: Daniel BilberryShreyang Patel, MD  Chief Complaint: Infection right great toe.   HPI: Daniel [name] is a 64 y.o. male who complains of  Ulcer to right great toe.  Present for 2-3 weeks.  Has not seen anyone about this.  Noted redness to toe upon admission.  Past Medical History  Diagnosis Date  . Diabetes mellitus without complication (HCC)   . Stented coronary artery   . Myocardial infarct (HCC)   . Hypertension   . Coronary artery disease    History reviewed. No pertinent past surgical history. Social History   Social History  . Marital Status: Single    Spouse Name: N/A  . Number of Children: N/A  . Years of Education: N/A   Social History Main Topics  . Smoking status: Never Smoker   . Smokeless tobacco: None  . Alcohol Use: No  . Drug Use: None  . Sexual Activity: Not Asked   Other Topics Concern  . None   Social History Narrative   History reviewed. No pertinent family history. No Known Allergies Prior to Admission medications   Medication Sig Start Date End Date Taking? Authorizing Provider  aspirin EC 81 MG tablet Take 81 mg by mouth daily.   Yes Historical Provider, MD  atorvastatin (LIPITOR) 10 MG tablet Take 10 mg by mouth at bedtime.   Yes Historical Provider, MD  furosemide (LASIX) 40 MG tablet Take 40 mg by mouth daily.   Yes Historical Provider, MD  gabapentin (NEURONTIN) 600 MG tablet Take 600 mg by mouth 3 (three) times daily.   Yes Historical Provider, MD  glipiZIDE (GLUCOTROL) 10 MG tablet Take 10 mg by mouth 2 (two) times daily before a meal.   Yes Historical Provider, MD  losartan (COZAAR) 100 MG tablet Take 100 mg by mouth daily.   Yes Historical Provider, MD  metFORMIN (GLUCOPHAGE) 500 MG tablet Take 1,000 mg by mouth 2 (two) times daily.   Yes Historical Provider, MD  omeprazole (PRILOSEC) 20 MG capsule Take 20 mg by mouth daily.   Yes Historical Provider, MD  pioglitazone (ACTOS) 30 MG tablet Take 30 mg by  mouth daily.   Yes Historical Provider, MD  pregabalin (LYRICA) 75 MG capsule Take 75 mg by mouth 2 (two) times daily.   Yes Historical Provider, MD   Ct Head Wo Contrast  10/30/2015  CLINICAL DATA:  Generalized weakness, diabetes, hypertension, right visual disturbance EXAM: CT HEAD WITHOUT CONTRAST TECHNIQUE: Contiguous axial images were obtained from the base of the skull through the vertex without contrast. COMPARISON:  03/01/2011 FINDINGS: Mild brain atrophy and chronic white matter microvascular ischemic changes throughout the periventricular white matter. These changes have slightly progressed. No acute intracranial hemorrhage, mass lesion, definite infarction, midline shift, herniation, hydrocephalus, or extra-axial fluid collection. Cisterns patent. No cerebellar abnormality. Skull intact. Mastoids and sinuses remain clear. Atherosclerosis of the intracranial vessels. IMPRESSION: Atrophy and slight progression of chronic white matter microvascular ischemic changes. No acute intracranial process by noncontrast CT. Electronically Signed   By: Judie PetitM.  Shick M.D.   On: 10/30/2015 18:15   Dg Chest Port 1 View  10/30/2015  CLINICAL DATA:  Generalized weakness and elevated blood glucose today. EXAM: PORTABLE CHEST 1 VIEW COMPARISON:  CT chest 04/27/2015. PA and lateral chest 04/27/2015 and 01/22/2015. FINDINGS: Heart size is normal. The lungs are clear. No pneumothorax or pleural effusion. IMPRESSION: No acute disease. Electronically Signed   By: Drusilla Kannerhomas  Dalessio M.D.   On: 10/30/2015 15:14  Dg Toe Great Right  10/30/2015  CLINICAL DATA:  Right toe swelling for 2 weeks.  Diabetic patient. EXAM: RIGHT GREAT TOE COMPARISON:  None. FINDINGS: There is no evidence of fracture or dislocation. There is no evidence of cortical erosions to suggest osteomyelitis radiographically. Heterogeneous appearance of the bony matrix of the first phalanx is noted, with uncertain significance. There is soft tissue swelling of  the dorsal and palmar aspect of the mid and forefoot. IMPRESSION: No evidence of fracture or subluxation. No definitive radiographic evidence of osteomyelitis. Heterogeneous appearance of the bony matrix of the first digit with uncertain significance. Electronically Signed   By: Ted Mcalpine M.D.   On: 10/30/2015 13:51    Positive ROS: All other systems have been reviewed and were otherwise negative with the exception of those mentioned in the HPI and as above.  12 point ROS was performed.  Physical Exam: General: Alert and oriented.  No apparent distress.  Vascular:  Left foot:Dorsalis Pedis:  diminished Posterior Tibial:  diminished  Right foot: Dorsalis Pedis:  diminished Posterior Tibial:  diminished  Neuro:intact sensation   Derm:Ulceration ~23mm medial right great toe with purulence noted.  Does not probe to bone immediately.  Noted erythema dorsally to foot to level of MTPJ.  No lymphangitis.  Ortho/MS: Diffuse edema to right foot    Assessment: Diabetic foot ulcer with infection   Plan: Culture of purulence performed today.  Will order MRI to evaluate for osteomyelitis. Dressing applied.  Will f/u p MRI.    Irean Hong, DPM Cell [phone]   10/31/2015 2:13 PM

## 2015-10-31 NOTE — Progress Notes (Signed)
Pharmacy Antibiotic Follow-up Note  Daniel [name] is a 64 y.o. year-old male admitted on 10/30/2015.  The patient is currently on day 2 of vancomycin and day 1 of unasyn for right toe celulitis.  Assessment/Plan: After discussion with Dr. Allena KatzPatel patient will remain on same antibiotic course. Blood cultures from [facility] resulted in strep species in 1/2 bottles. This is most likley a contaminant. Per Dr Allena KatzPatel [facility]   Temp (24hrs), Avg:98.1 F (36.7 C), Min:98 F (36.7 C), Max:98.2 F (36.8 C)   Recent Labs Lab 10/30/15 1732 10/31/15 0540  WBC 12.0* 10.3    Recent Labs Lab 10/30/15 1424 10/31/15 0540  CREATININE 0.91 1.49*   Estimated Creatinine Clearance: 49.1 mL/min (by C-G formula based on Cr of 1.49).    No Known Allergies  Antimicrobials this admission: Zosyn 12/25 >> 12/25 vancomycin 12/25 >>  unasyn 12/26>>  Levels/dose changes this admission:   Microbiology results: 12/26 BCx: 1/2 strep genus   Thank you for allowing pharmacy to be a part of this patient's care.  Olene FlossMelissa D Cyndi Montejano PharmD 10/31/2015 11:22 AM

## 2015-10-31 NOTE — Consult Note (Signed)
ANTIBIOTIC CONSULT NOTE - INITIAL  Pharmacy Consult for unasyn Indication: right toe cellulitis  No Known Allergies  Patient Measurements: Height: 5\' 8"  (172.7 cm) Weight: 178 lb 11.2 oz (81.058 kg) IBW/kg (Calculated) : 68.4 Adjusted Body Weight:   Vital Signs: Temp: 98.1 F (36.7 C) (12/26 0514) Temp Source: Oral (12/26 0514) BP: 113/78 mmHg (12/26 1028) Pulse Rate: 82 (12/26 0514) Intake/Output from previous day: 12/25 0701 - 12/26 0700 In: 129 [I.V.:129] Out: 400 [Urine:400] Intake/Output from this shift:    Labs:  Recent Labs  10/30/15 1424 10/30/15 1732 10/31/15 0540  WBC  --  12.0* 10.3  HGB  --  13.5 12.5*  PLT  --  207 184  CREATININE 0.91  --  1.49*   Estimated Creatinine Clearance: 49.1 mL/min (by C-G formula based on Cr of 1.49). No results for input(s): VANCOTROUGH, VANCOPEAK, VANCORANDOM, GENTTROUGH, GENTPEAK, GENTRANDOM, TOBRATROUGH, TOBRAPEAK, TOBRARND, AMIKACINPEAK, AMIKACINTROU, AMIKACIN in the last 72 hours.   Microbiology: No results found for this or any previous visit (from the past 720 hour(s)).  Medical History: Past Medical History  Diagnosis Date  . Diabetes mellitus without complication (HCC)   . Stented coronary artery   . Myocardial infarct (HCC)   . Hypertension   . Coronary artery disease     Medications:  Scheduled:  . ampicillin-sulbactam (UNASYN) IV  3 g Intravenous Q6H  . aspirin EC  81 mg Oral Daily  . atorvastatin  10 mg Oral QHS  . furosemide  20 mg Intravenous Q12H  . insulin aspart  0-9 Units Subcutaneous TID AC & HS  . losartan  100 mg Oral Daily  . metoprolol tartrate  25 mg Oral BID  . pantoprazole  40 mg Oral QAC breakfast  . pregabalin  75 mg Oral BID  . vancomycin  1,000 mg Intravenous Q12H   Assessment: Pt is a 64 year old male with cellulitis to his great right toe. Pt is already on vancomycin. Pharmacy consulted to dose unasyn.  Goal of Therapy:  resolution of infection  Plan:  Follow up culture  results amp/sulb 3g q 6 hours. pharmacy to continue to monitor. thank you for the consult  Zosia Lucchese D Lianah Peed 10/31/2015,10:37 AM

## 2015-10-31 NOTE — Progress Notes (Signed)
[facility] [place] at [facility]                                                                                                                                                                                            Patient Demographics   Daniel [name], is a 64 y.o. male, DOB - 04-25-1951, WUJ:[id number]  Admit date - 10/30/2015   Admitting Physician Ramonita Lab, MD  Outpatient Primary MD for the patient is [name],[name], MD   LOS - 1  Subjective: Patient admitted with shortness of breath,  right toe erythema and pain, also left sided new eye visual difficulties    Review of Systems:   CONSTITUTIONAL: No documented fever. No fatigue, weakness. No weight gain, no weight loss.  EYES: No blurry or double vision. Left eye visual difficulties ENT: No tinnitus. No postnasal drip. No redness of the oropharynx.  RESPIRATORY: No cough, no wheeze, no hemoptysis. Positive dyspnea.  CARDIOVASCULAR: No chest pain. No orthopnea. No palpitations. No syncope.  GASTROINTESTINAL: No nausea, no vomiting or diarrhea. No abdominal pain. No melena or hematochezia.  GENITOURINARY: No dysuria or hematuria.  ENDOCRINE: No polyuria or nocturia. No heat or cold intolerance.  HEMATOLOGY: No anemia. No bruising. No bleeding.  INTEGUMENTARY: No rashes. No lesions.  MUSCULOSKELETAL: No arthritis. No swelling. No gout.  NEUROLOGIC: No numbness, tingling, or ataxia. No seizure-type activity. Generalized weakness PSYCHIATRIC: No anxiety. No insomnia. No ADD.    Vitals:   Filed Vitals:   10/31/15 0419 10/31/15 0514 10/31/15 0734 10/31/15 1028  BP: 87/54 108/70 117/72 113/78  Pulse: 78 82    Temp: 98 F (36.7 C) 98.1 F (36.7 C)    TempSrc: Oral Oral    Resp: 16     Height:      Weight: 81.058 kg (178 lb 11.2 oz)     SpO2: 97%       Wt Readings from Last 3 Encounters:  10/31/15 81.058 kg (178 lb 11.2 oz)  04/27/15 74.844 kg (165 lb)     Intake/Output Summary  (Last 24 hours) at 10/31/15 1317 Last data filed at 10/31/15 1210  Gross per 24 hour  Intake 228.97 ml  Output    400 ml  Net -171.03 ml    Physical Exam:   GENERAL: Pleasant-appearing in no apparent distress.  HEAD, EYES, EARS, NOSE AND THROAT: Atraumatic, normocephalic. Extraocular muscles are intact. Pupils equal and reactive to light. Sclerae anicteric. No conjunctival injection. No oro-pharyngeal erythema.  NECK: Supple. There is no jugular venous distention. No bruits, no lymphadenopathy, no thyromegaly.  HEART: Regular rate and rhythm,. No murmurs, no  rubs, no clicks.  LUNGS: Bilateral crackles at the bases ABDOMEN: Soft, flat, nontender, nondistended. Has good bowel sounds. No hepatosplenomegaly appreciated.  EXTREMITIES: No evidence of any cyanosis, clubbing, or peripheral edema.  +2 pedal and radial pulses bilaterally.  NEUROLOGIC: The patient is alert, awake, and oriented x3 with no focal motor or sensory deficits appreciated bilaterally.  SKIN: Right great toe erythematous surrounding cellulitis  Psych: Not anxious, depressed LN: No inguinal LN enlargement    Antibiotics   Anti-infectives    Start     Dose/Rate Route Frequency Ordered Stop   10/31/15 1130  Ampicillin-Sulbactam (UNASYN) 3 g in sodium chloride 0.9 % 100 mL IVPB     3 g 100 mL/hr over 60 Minutes Intravenous Every 6 hours 10/31/15 1035     10/31/15 0500  vancomycin (VANCOCIN) IVPB 1000 mg/200 mL premix     1,000 mg 200 mL/hr over 60 Minutes Intravenous Every 12 hours 10/30/15 2248     10/30/15 2130  vancomycin (VANCOCIN) IVPB 1000 mg/200 mL premix  Status:  Discontinued     1,000 mg 200 mL/hr over 60 Minutes Intravenous  Once 10/30/15 2120 10/30/15 2141   10/30/15 1645  piperacillin-tazobactam (ZOSYN) IVPB 3.375 g     3.375 g 100 mL/hr over 30 Minutes Intravenous  Once 10/30/15 1633 10/30/15 2019   10/30/15 1645  vancomycin (VANCOCIN) IVPB 1000 mg/200 mL premix     1,000 mg 200 mL/hr over 60 Minutes  Intravenous  Once 10/30/15 1633 10/30/15 2019      Medications   Scheduled Meds: . ampicillin-sulbactam (UNASYN) IV  3 g Intravenous Q6H  . aspirin EC  81 mg Oral Daily  . atorvastatin  10 mg Oral QHS  . furosemide  20 mg Intravenous Q12H  . insulin aspart  0-9 Units Subcutaneous TID AC & HS  . losartan  100 mg Oral Daily  . metoprolol tartrate  25 mg Oral BID  . pantoprazole  40 mg Oral QAC breakfast  . pregabalin  75 mg Oral BID  . vancomycin  1,000 mg Intravenous Q12H   Continuous Infusions: . heparin 1,450 Units/hr (10/31/15 1309)   PRN Meds:.acetaminophen, nitroGLYCERIN, ondansetron (ZOFRAN) IV, oxyCODONE   Data Review:   Micro Results Recent Results (from the past 240 hour(s))  Culture, blood (routine x 2)     Status: None (Preliminary result)   Collection Time: 10/30/15  4:44 PM  Result Value Ref Range Status   Specimen Description BLOOD LEFT WRIST  Final   Special Requests BOTTLES DRAWN AEROBIC AND ANAEROBIC  2CC  Final   Culture  Setup Time   Final    GRAM POSITIVE COCCI AEROBIC BOTTLE ONLY CRITICAL VALUE NOTED.  VALUE IS CONSISTENT WITH PREVIOUSLY REPORTED AND CALLED VALUE.    Culture GRAM POSITIVE COCCI AEROBIC BOTTLE ONLY   Final   Report Status PENDING  Incomplete  Culture, blood (routine x 2)     Status: None (Preliminary result)   Collection Time: 10/30/15  5:31 PM  Result Value Ref Range Status   Specimen Description BLOOD LEFT HAND  Final   Special Requests BOTTLES DRAWN AEROBIC AND ANAEROBIC  2CC  Final   Culture  Setup Time   Final    GRAM POSITIVE COCCI IN CHAINS IN BOTH AEROBIC AND ANAEROBIC BOTTLES CRITICAL RESULT CALLED TO, READ BACK BY AND VERIFIED WITH: KAREN HAYES AT 1045 10/31/15 CTJ    Culture   Final    STREPTOCOCCUS SPECIES IN BOTH AEROBIC AND ANAEROBIC BOTTLES  Report Status PENDING  Incomplete  Blood Culture ID Panel (Reflexed)     Status: Abnormal   Collection Time: 10/30/15  5:31 PM  Result Value Ref Range Status    Enterococcus species NOT DETECTED NOT DETECTED Final   Listeria monocytogenes NOT DETECTED NOT DETECTED Final   Staphylococcus species NOT DETECTED NOT DETECTED Final   Staphylococcus aureus NOT DETECTED NOT DETECTED Final   Streptococcus species DETECTED (A) NOT DETECTED Final    Comment: CRITICAL RESULT CALLED TO, READ BACK BY AND VERIFIED WITH: KAREN HAYES AT 1045 ON 10/31/15 CTJ    Streptococcus agalactiae NOT DETECTED NOT DETECTED Final   Streptococcus pneumoniae NOT DETECTED NOT DETECTED Final   Streptococcus pyogenes NOT DETECTED NOT DETECTED Final   Acinetobacter baumannii NOT DETECTED NOT DETECTED Final   Enterobacteriaceae species NOT DETECTED NOT DETECTED Final   Enterobacter cloacae complex NOT DETECTED NOT DETECTED Final   Escherichia coli NOT DETECTED NOT DETECTED Final   Klebsiella oxytoca NOT DETECTED NOT DETECTED Final   Klebsiella pneumoniae NOT DETECTED NOT DETECTED Final   Proteus species NOT DETECTED NOT DETECTED Final   Serratia marcescens NOT DETECTED NOT DETECTED Final   Haemophilus influenzae NOT DETECTED NOT DETECTED Final   Neisseria meningitidis NOT DETECTED NOT DETECTED Final   Pseudomonas aeruginosa NOT DETECTED NOT DETECTED Final   Candida albicans NOT DETECTED NOT DETECTED Final   Candida glabrata NOT DETECTED NOT DETECTED Final   Candida krusei NOT DETECTED NOT DETECTED Final   Candida parapsilosis NOT DETECTED NOT DETECTED Final   Candida tropicalis NOT DETECTED NOT DETECTED Final   Carbapenem resistance NOT DETECTED NOT DETECTED Final   Methicillin resistance NOT DETECTED NOT DETECTED Final   Vancomycin resistance NOT DETECTED NOT DETECTED Final    Radiology Reports Ct Head Wo Contrast  10/30/2015  CLINICAL DATA:  Generalized weakness, diabetes, hypertension, right visual disturbance EXAM: CT HEAD WITHOUT CONTRAST TECHNIQUE: Contiguous axial images were obtained from the base of the skull through the vertex without contrast. COMPARISON:   03/01/2011 FINDINGS: Mild brain atrophy and chronic white matter microvascular ischemic changes throughout the periventricular white matter. These changes have slightly progressed. No acute intracranial hemorrhage, mass lesion, definite infarction, midline shift, herniation, hydrocephalus, or extra-axial fluid collection. Cisterns patent. No cerebellar abnormality. Skull intact. Mastoids and sinuses remain clear. Atherosclerosis of the intracranial vessels. IMPRESSION: Atrophy and slight progression of chronic white matter microvascular ischemic changes. No acute intracranial process by noncontrast CT. Electronically Signed   By: Judie PetitM.  Shick M.D.   On: 10/30/2015 18:15   Dg Chest Port 1 View  10/30/2015  CLINICAL DATA:  Generalized weakness and elevated blood glucose today. EXAM: PORTABLE CHEST 1 VIEW COMPARISON:  CT chest 04/27/2015. PA and lateral chest 04/27/2015 and 01/22/2015. FINDINGS: Heart size is normal. The lungs are clear. No pneumothorax or pleural effusion. IMPRESSION: No acute disease. Electronically Signed   By: Drusilla Kannerhomas  Dalessio M.D.   On: 10/30/2015 15:14   Dg Toe Great Right  10/30/2015  CLINICAL DATA:  Right toe swelling for 2 weeks.  Diabetic patient. EXAM: RIGHT GREAT TOE COMPARISON:  None. FINDINGS: There is no evidence of fracture or dislocation. There is no evidence of cortical erosions to suggest osteomyelitis radiographically. Heterogeneous appearance of the bony matrix of the first phalanx is noted, with uncertain significance. There is soft tissue swelling of the dorsal and palmar aspect of the mid and forefoot. IMPRESSION: No evidence of fracture or subluxation. No definitive radiographic evidence of osteomyelitis. Heterogeneous appearance of the bony matrix  of the first digit with uncertain significance. Electronically Signed   By: Ted Mcalpine M.D.   On: 10/30/2015 13:51     CBC  Recent Labs Lab 10/30/15 1732 10/31/15 0540  WBC 12.0* 10.3  HGB 13.5 12.5*  HCT  40.3 36.8*  PLT 207 184  MCV 90.8 89.0  MCH 30.4 30.2  MCHC 33.5 34.0  RDW 14.2 14.5  LYMPHSABS 0.9* 1.4  MONOABS 1.1* 1.3*  EOSABS 0.0 0.0  BASOSABS 0.0 0.0    Chemistries   Recent Labs Lab 10/30/15 1424 10/31/15 0540  NA 139 138  K 3.6 3.8  CL 99* 101  CO2 25 28  GLUCOSE 395* 215*  BUN 18 27*  CREATININE 0.91 1.49*  CALCIUM 9.0 8.1*  AST 23  --   ALT 17  --   ALKPHOS 97  --   BILITOT 1.3*  --    ------------------------------------------------------------------------------------------------------------------ estimated creatinine clearance is 49.1 mL/min (by C-G formula based on Cr of 1.49). ------------------------------------------------------------------------------------------------------------------ No results for input(s): HGBA1C in the last 72 hours. ------------------------------------------------------------------------------------------------------------------ No results for input(s): CHOL, HDL, LDLCALC, TRIG, CHOLHDL, LDLDIRECT in the last 72 hours. ------------------------------------------------------------------------------------------------------------------  Recent Labs  10/31/15 0540  TSH 1.595   ------------------------------------------------------------------------------------------------------------------ No results for input(s): VITAMINB12, FOLATE, FERRITIN, TIBC, IRON, RETICCTPCT in the last 72 hours.  Coagulation profile  Recent Labs Lab 10/30/15 1331 10/31/15 0540  INR 1.10 1.23    No results for input(s): DDIMER in the last 72 hours.  Cardiac Enzymes  Recent Labs Lab 10/30/15 2253 10/31/15 0540 10/31/15 1135  TROPONINI 0.66* 0.82* 0.71*   ------------------------------------------------------------------------------------------------------------------ Invalid input(s): POCBNP    Assessment & Plan   1. Unstable angina/non-STEMI Continue heparin drip for now, await echocardiogram of the heart has been seen by  cardiology Will likely need some sort ischemic workup   2. Acute hypoxic respiratory failure secondary to acute exacerbation of CHF type unknown  Echocardiogram of the heart pending Lasix IV every 12 hours Monitor daily weights and intake and output Continue aspirin, beta blocker and statin  3. Right great toe and foot cellulitis Continue vancomycin at Unasyn. Patient also has blood cultures which are positive. Follow-up on the  identification of these Ask podiatry to see he does have 2 ulcers on the foot  4. Progressively worsening bilateral lower extremity weakness-.  With visual blindness will get a MRI make sure he denied with stroke   5. 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 Shaune Pollack has discussed with on-call ophthalmologist Dr. Duke Salvia, who has recommended outpatient follow-up after discharge  6. Diabetes mellitus Sliding scale insulin blood sugars elevated may need long-acting insulin and resume glipizide hold metformin     Code Status Orders        Start     Ordered   10/30/15 2249  Full code   Continuous     10/30/15 2248    Advance Directive Documentation        Most Recent Value   Type of Advance Directive  Healthcare Power of Attorney   Pre-existing out of facility DNR order (yellow form or pink MOST form)     "MOST" Form in Place?             Consults  cardiology DVT Prophylaxis  heparin  Lab Results  Component Value Date   PLT 184 10/31/2015     Time Spent in minutes   35 minutes  Auburn Bilberry M.D on 10/31/2015 at 1:17 PM  Between 7am  to 6pm - Pager - [phone]  After 6pm go to [email or web address] - password [email or web address] [facility]  [facility]   Office  [phone]

## 2015-10-31 NOTE — Progress Notes (Signed)
Spoke with dr. Allena Katzpatel to make aware patients bp is 113/78. Per md hold am dose losartan give scheduled dose of metoprolol

## 2015-10-31 NOTE — Consult Note (Signed)
CC: generalized weakness   HPI: Daniel [name] is an 64 y.o. male  with a known history of diver [name], coronary artery disease, essential hypertension L eye blindness comes in with progressive Lower extremity weakness for the past 6 months.   Past Medical History  Diagnosis Date  . Diabetes mellitus without complication (HCC)   . Stented coronary artery   . Myocardial infarct (HCC)   . Hypertension   . Coronary artery disease     History reviewed. No pertinent past surgical history.  History reviewed. No pertinent family history.  Social History:  reports that he has never smoked. He does not have any smokeless tobacco history on file. He reports that he does not drink alcohol. His drug history is not on file.  No Known Allergies  Medications: I have reviewed the patient's current medications.  ROS: History obtained from the patient  General ROS: negative for - chills, fatigue, fever, night sweats, weight gain or weight loss Psychological ROS: negative for - behavioral disorder, hallucinations, memory difficulties, mood swings or suicidal ideation Ophthalmic ROS: negative for - blurry vision, double vision, eye pain or loss of vision ENT ROS: negative for - epistaxis, nasal discharge, oral lesions, sore throat, tinnitus or vertigo Allergy and Immunology ROS: negative for - hives or itchy/watery eyes Hematological and Lymphatic ROS: negative for - bleeding problems, bruising or swollen lymph nodes Endocrine ROS: negative for - galactorrhea, hair pattern changes, polydipsia/polyuria or temperature intolerance Respiratory ROS: negative for - cough, hemoptysis, shortness of breath or wheezing Cardiovascular ROS: negative for - chest pain, dyspnea on exertion, edema or irregular heartbeat Gastrointestinal ROS: negative for - abdominal pain, diarrhea, hematemesis, nausea/vomiting or stool incontinence Genito-Urinary ROS: negative for - dysuria, hematuria, incontinence or urinary  frequency/urgency Musculoskeletal ROS: negative for - joint swelling or muscular weakness Neurological ROS: as noted in HPI Dermatological ROS: negative for rash and skin lesion changes  Physical Examination: Blood pressure 113/78, pulse 82, temperature 98.1 F (36.7 C), temperature source Oral, resp. rate 16, height 5\' 8"  (1.727 m), weight 178 lb 11.2 oz (81.058 kg), SpO2 97 %.   Neurological Examination Mental Status: Alert, oriented, thought content appropriate.  Speech fluent without evidence of aphasia.  Able to follow 3 step commands without difficulty. Cranial Nerves: II: Discs flat bilaterally; Visual fields grossly normal, pupils equal, round, reactive to light and accommodation III,IV, VI: ptosis not present, extra-ocular motions intact bilaterally V,VII: smile symmetric, facial light touch sensation normal bilaterally VIII: hearing normal bilaterally IX,X: gag reflex present XI: bilateral shoulder shrug XII: midline tongue extension Motor: Right : Upper extremity   5/5    Left:     Upper extremity   5/5  Lower extremity   4+5     Lower extremity   4+ Tone and bulk:normal tone throughout; no atrophy noted Sensory: Pinprick and light touch intact throughout, bilaterally Deep Tendon Reflexes: 2+ and symmetric throughout Plantars: Right: downgoing   Left: downgoing Cerebellar: normal finger-to-nose, normal rapid alternating movements and normal heel-to-shin test Gait: normal gait and station      Laboratory Studies:   Basic Metabolic Panel:  Recent Labs Lab 10/30/15 1424 10/31/15 0540  NA 139 138  K 3.6 3.8  CL 99* 101  CO2 25 28  GLUCOSE 395* 215*  BUN 18 27*  CREATININE 0.91 1.49*  CALCIUM 9.0 8.1*    Liver Function Tests:  Recent Labs Lab 10/30/15 1424  AST 23  ALT 17  ALKPHOS 97  BILITOT 1.3*  PROT 7.6  ALBUMIN 4.1   No results for input(s): LIPASE, AMYLASE in the last 168 hours. No results for input(s): AMMONIA in the last 168  hours.  CBC:  Recent Labs Lab 10/30/15 1732 10/31/15 0540  WBC 12.0* 10.3  NEUTROABS 10.0* 7.6*  HGB 13.5 12.5*  HCT 40.3 36.8*  MCV 90.8 89.0  PLT 207 184    Cardiac Enzymes:  Recent Labs Lab 10/30/15 1424 10/30/15 2253 10/31/15 0540  TROPONINI 0.33* 0.66* 0.82*    BNP: Invalid input(s): POCBNP  CBG:  Recent Labs Lab 10/30/15 2328 10/31/15 1157  GLUCAP 381* 198*    Microbiology: Results for orders placed or performed during the hospital encounter of 10/30/15  Culture, blood (routine x 2)     Status: None (Preliminary result)   Collection Time: 10/30/15  5:31 PM  Result Value Ref Range Status   Specimen Description BLOOD LEFT HAND  Final   Special Requests BOTTLES DRAWN AEROBIC AND ANAEROBIC  2CC  Final   Culture  Setup Time   Final    GRAM POSITIVE COCCI IN CHAINS IN BOTH AEROBIC AND ANAEROBIC BOTTLES CRITICAL RESULT CALLED TO, READ BACK BY AND VERIFIED WITH: KAREN HAYES AT 1045 10/31/15 CTJ    Culture   Final    STREPTOCOCCUS SPECIES IN BOTH AEROBIC AND ANAEROBIC BOTTLES    Report Status PENDING  Incomplete  Blood Culture ID Panel (Reflexed)     Status: Abnormal   Collection Time: 10/30/15  5:31 PM  Result Value Ref Range Status   Enterococcus species NOT DETECTED NOT DETECTED Final   Listeria monocytogenes NOT DETECTED NOT DETECTED Final   Staphylococcus species NOT DETECTED NOT DETECTED Final   Staphylococcus aureus NOT DETECTED NOT DETECTED Final   Streptococcus species DETECTED (A) NOT DETECTED Final    Comment: CRITICAL RESULT CALLED TO, READ BACK BY AND VERIFIED WITH: KAREN HAYES AT 1045 ON 10/31/15 CTJ    Streptococcus agalactiae NOT DETECTED NOT DETECTED Final   Streptococcus pneumoniae NOT DETECTED NOT DETECTED Final   Streptococcus pyogenes NOT DETECTED NOT DETECTED Final   Acinetobacter baumannii NOT DETECTED NOT DETECTED Final   Enterobacteriaceae species NOT DETECTED NOT DETECTED Final   Enterobacter cloacae complex NOT DETECTED  NOT DETECTED Final   Escherichia coli NOT DETECTED NOT DETECTED Final   Klebsiella oxytoca NOT DETECTED NOT DETECTED Final   Klebsiella pneumoniae NOT DETECTED NOT DETECTED Final   Proteus species NOT DETECTED NOT DETECTED Final   Serratia marcescens NOT DETECTED NOT DETECTED Final   Haemophilus influenzae NOT DETECTED NOT DETECTED Final   Neisseria meningitidis NOT DETECTED NOT DETECTED Final   Pseudomonas aeruginosa NOT DETECTED NOT DETECTED Final   Candida albicans NOT DETECTED NOT DETECTED Final   Candida glabrata NOT DETECTED NOT DETECTED Final   Candida krusei NOT DETECTED NOT DETECTED Final   Candida parapsilosis NOT DETECTED NOT DETECTED Final   Candida tropicalis NOT DETECTED NOT DETECTED Final   Carbapenem resistance NOT DETECTED NOT DETECTED Final   Methicillin resistance NOT DETECTED NOT DETECTED Final   Vancomycin resistance NOT DETECTED NOT DETECTED Final    Coagulation Studies:  Recent Labs  10/30/15 1331 10/31/15 0540  LABPROT 14.4 15.7*  INR 1.10 1.23    Urinalysis:  Recent Labs Lab 10/30/15 1541  COLORURINE STRAW*  LABSPEC 1.012  PHURINE 5.0  GLUCOSEU >500*  HGBUR 2+*  BILIRUBINUR NEGATIVE  KETONESUR 1+*  PROTEINUR 100*  NITRITE NEGATIVE  LEUKOCYTESUR NEGATIVE    Lipid Panel:  No results found for: CHOL, TRIG, HDL, CHOLHDL,  VLDL, LDLCALC  HgbA1C: No results found for: HGBA1C  Urine Drug Screen:  No results found for: LABOPIA, COCAINSCRNUR, LABBENZ, AMPHETMU, THCU, LABBARB  Alcohol Level: No results for input(s): ETH in the last 168 hours.  Other results: EKG: normal EKG, normal sinus rhythm, unchanged from previous tracings.  Imaging: Ct Head Wo Contrast  10/30/2015  CLINICAL DATA:  Generalized weakness, diabetes, hypertension, right visual disturbance EXAM: CT HEAD WITHOUT CONTRAST TECHNIQUE: Contiguous axial images were obtained from the base of the skull through the vertex without contrast. COMPARISON:  03/01/2011 FINDINGS: Mild brain  atrophy and chronic white matter microvascular ischemic changes throughout the periventricular white matter. These changes have slightly progressed. No acute intracranial hemorrhage, mass lesion, definite infarction, midline shift, herniation, hydrocephalus, or extra-axial fluid collection. Cisterns patent. No cerebellar abnormality. Skull intact. Mastoids and sinuses remain clear. Atherosclerosis of the intracranial vessels. IMPRESSION: Atrophy and slight progression of chronic white matter microvascular ischemic changes. No acute intracranial process by noncontrast CT. Electronically Signed   By: Judie Petit.  Shick M.D.   On: 10/30/2015 18:15   Dg Chest Port 1 View  10/30/2015  CLINICAL DATA:  Generalized weakness and elevated blood glucose today. EXAM: PORTABLE CHEST 1 VIEW COMPARISON:  CT chest 04/27/2015. PA and lateral chest 04/27/2015 and 01/22/2015. FINDINGS: Heart size is normal. The lungs are clear. No pneumothorax or pleural effusion. IMPRESSION: No acute disease. Electronically Signed   By: Drusilla Kanner M.D.   On: 10/30/2015 15:14   Dg Toe Great Right  10/30/2015  CLINICAL DATA:  Right toe swelling for 2 weeks.  Diabetic patient. EXAM: RIGHT GREAT TOE COMPARISON:  None. FINDINGS: There is no evidence of fracture or dislocation. There is no evidence of cortical erosions to suggest osteomyelitis radiographically. Heterogeneous appearance of the bony matrix of the first phalanx is noted, with uncertain significance. There is soft tissue swelling of the dorsal and palmar aspect of the mid and forefoot. IMPRESSION: No evidence of fracture or subluxation. No definitive radiographic evidence of osteomyelitis. Heterogeneous appearance of the bony matrix of the first digit with uncertain significance. Electronically Signed   By: Ted Mcalpine M.D.   On: 10/30/2015 13:51     Assessment/Plan:  64 y.o. male  with a known history of diver [name], coronary artery disease, essential hypertension L eye  blindness comes in with progressive Lower extremity weakness for the past 6 months.   - Pt has been having progressive weakness Don't think GBS or CIDP  Agree with obtaining MRI brain Out pt follow with EMG/NCS  10/31/2015, 12:14 PM

## 2015-10-31 NOTE — Care Management Note (Signed)
Case Management Note  Patient Details  Name: Daniel [name] MRN: [id number] Date of Birth: 02/26/1951  Subjective/Objective:                  Met with patient to discuss discharge planning. His PCP is Dr. Jeffrey [name] with [facility]. He states he lives with his dog whom his brother is helping him with right now. Patient states he is independent with daily activies and able to drive. O2 is new to this patient. He denies RNCM needs. He denies difficulty obtaining Rx.    Action/Plan: RNCM will follow for home O2.   Expected Discharge Date:                  Expected Discharge Plan:     In-House Referral:     Discharge planning Services     Post Acute Care Choice:    Choice offered to:  Patient  DME Arranged:    DME Agency:     HH Arranged:    HH Agency:     Status of Service:  Completed, signed off  Medicare Important Message Given:    Date Medicare IM Given:    Medicare IM give by:    Date Additional Medicare IM Given:    Additional Medicare Important Message give by:     If discussed at Long Length of Stay Meetings, dates discussed:    Additional Comments:  Angela Johnson, RN 10/31/2015, 10:14 AM  

## 2015-10-31 NOTE — Progress Notes (Signed)
ANTICOAGULATION CONSULT NOTE - Initial Consult  Pharmacy Consult for heparin drip Indication: chest pain/ACS  No Known Allergies  Patient Measurements: Height: 5\' 8"  (172.7 cm) Weight: 178 lb 11.2 oz (81.058 kg) IBW/kg (Calculated) : 68.4 Heparin Dosing Weight: 77.1 kg  Vital Signs: Temp: 98.3 F (36.8 C) (12/26 1927) Temp Source: Oral (12/26 1927) BP: 125/81 mmHg (12/26 1927) Pulse Rate: 87 (12/26 1927)  Labs:  Recent Labs  10/30/15 1331  10/30/15 1424 10/30/15 1732 10/30/15 2253 10/31/15 0540 10/31/15 1135 10/31/15 2024  HGB  --   --   --  13.5  --  12.5*  --   --   HCT  --   --   --  40.3  --  36.8*  --   --   PLT  --   --   --  207  --  184  --   --   APTT 32  --   --   --   --   --   --   --   LABPROT 14.4  --   --   --   --  15.7*  --   --   INR 1.10  --   --   --   --  1.23  --   --   HEPARINUNFRC  --   --   --   --  0.10*  --  0.12* 0.31  CREATININE  --   --  0.91  --   --  1.49*  --   --   TROPONINI  --   < > 0.33*  --  0.66* 0.82* 0.71*  --   < > = values in this interval not displayed.  Estimated Creatinine Clearance: 49.1 mL/min (by C-G formula based on Cr of 1.49).   Medical History: Past Medical History  Diagnosis Date  . Diabetes mellitus without complication (HCC)   . Stented coronary artery   . Myocardial infarct (HCC)   . Hypertension   . Coronary artery disease    Assessment: Pharmacy consulted to dose a heparin drip in this 64 year old male for chest pain/ACS. Patient was not taking anticoagulants prior to admission per medication rec. Baseline CBC, protime/INR, and APTT were obtained in ED and are pending.   Heparin dosing weight = 77.1 kg  Goal of Therapy:  Heparin level 0.3-0.7 units/ml Monitor platelets by anticoagulation protocol: Yes   Plan:  Heparin level subtherapeutic. 2300 Units IV x 1 bolus and increase rate to 1450 units/hr. Will recheck level in 6 hours.  12/26:  HL @ 20:30 = 0.31 Will draw confirmation level in 6 hrs  on 12/27 @ 2:30.   Larue Lightner D, Pharm.D. Clinical Pharmacist 10/31/2015,9:24 PM

## 2015-10-31 NOTE — Progress Notes (Signed)
PT Cancellation Note  Patient Details Name: Daniel [name] MRN: [id number] DOB: 08/18/1951   Cancelled Treatment:    Reason Eval/Treat Not Completed: Medical issues which prohibited therapy. After performing chart review, it was determined that PT evaluation is currently contraindicated due to troponin level trend. Will continue to follow.   Neita CarpJulie Ann Cashmere Dingley, PT, DPT 10/31/2015, 10:37 AM

## 2015-11-01 ENCOUNTER — Inpatient Hospital Stay: Payer: Commercial Managed Care - HMO

## 2015-11-01 LAB — CBC
HEMATOCRIT: 36.1 % — AB (ref 40.0–52.0)
HEMOGLOBIN: 12.1 g/dL — AB (ref 13.0–18.0)
MCH: 29.8 pg (ref 26.0–34.0)
MCHC: 33.5 g/dL (ref 32.0–36.0)
MCV: 89 fL (ref 80.0–100.0)
Platelets: 205 10*3/uL (ref 150–440)
RBC: 4.06 MIL/uL — AB (ref 4.40–5.90)
RDW: 14.5 % (ref 11.5–14.5)
WBC: 10.2 10*3/uL (ref 3.8–10.6)

## 2015-11-01 LAB — GLUCOSE, CAPILLARY
GLUCOSE-CAPILLARY: 190 mg/dL — AB (ref 65–99)
GLUCOSE-CAPILLARY: 264 mg/dL — AB (ref 65–99)
GLUCOSE-CAPILLARY: 298 mg/dL — AB (ref 65–99)
Glucose-Capillary: 304 mg/dL — ABNORMAL HIGH (ref 65–99)

## 2015-11-01 LAB — BLOOD CULTURE ID PANEL (REFLEXED)
Acinetobacter baumannii: NOT DETECTED
CANDIDA ALBICANS: NOT DETECTED
CANDIDA GLABRATA: NOT DETECTED
CANDIDA TROPICALIS: NOT DETECTED
Candida krusei: NOT DETECTED
Candida parapsilosis: NOT DETECTED
Carbapenem resistance: NOT DETECTED
ENTEROBACTER CLOACAE COMPLEX: NOT DETECTED
ENTEROBACTERIACEAE SPECIES: NOT DETECTED
ESCHERICHIA COLI: NOT DETECTED
Enterococcus species: NOT DETECTED
Haemophilus influenzae: NOT DETECTED
KLEBSIELLA PNEUMONIAE: NOT DETECTED
Klebsiella oxytoca: NOT DETECTED
Listeria monocytogenes: NOT DETECTED
Methicillin resistance: NOT DETECTED
NEISSERIA MENINGITIDIS: NOT DETECTED
Proteus species: NOT DETECTED
Pseudomonas aeruginosa: NOT DETECTED
STAPHYLOCOCCUS AUREUS BCID: DETECTED — AB
STAPHYLOCOCCUS SPECIES: DETECTED — AB
STREPTOCOCCUS AGALACTIAE: NOT DETECTED
STREPTOCOCCUS PNEUMONIAE: NOT DETECTED
Serratia marcescens: NOT DETECTED
Streptococcus pyogenes: NOT DETECTED
Streptococcus species: NOT DETECTED
Vancomycin resistance: NOT DETECTED

## 2015-11-01 LAB — BASIC METABOLIC PANEL
Anion gap: 12 (ref 5–15)
BUN: 41 mg/dL — AB (ref 6–20)
CHLORIDE: 99 mmol/L — AB (ref 101–111)
CO2: 26 mmol/L (ref 22–32)
Calcium: 8 mg/dL — ABNORMAL LOW (ref 8.9–10.3)
Creatinine, Ser: 1.34 mg/dL — ABNORMAL HIGH (ref 0.61–1.24)
GFR calc Af Amer: >60 mL/min (ref >60)
GFR calc non Af Amer: 55 mL/min — ABNORMAL LOW (ref >60)
Glucose, Bld: 187 mg/dL — ABNORMAL HIGH (ref 65–99)
POTASSIUM: 3.5 mmol/L (ref 3.5–5.1)
SODIUM: 137 mmol/L (ref 135–145)

## 2015-11-01 LAB — VANCOMYCIN, TROUGH: VANCOMYCIN TR: 18 ug/mL (ref 10–20)

## 2015-11-01 LAB — RAPID HIV SCREEN (HIV 1/2 AB+AG)
HIV 1/2 Antibodies: NONREACTIVE
HIV-1 P24 ANTIGEN - HIV24: NONREACTIVE

## 2015-11-01 LAB — HEPARIN LEVEL (UNFRACTIONATED): HEPARIN UNFRACTIONATED: 0.28 [IU]/mL — AB (ref 0.30–0.70)

## 2015-11-01 MED ORDER — FUROSEMIDE 10 MG/ML IJ SOLN
40.0000 mg | Freq: Two times a day (BID) | INTRAMUSCULAR | Status: DC
  Administered 2015-11-01 – 2015-11-02 (×3): 40 mg via INTRAVENOUS
  Filled 2015-11-01 (×3): qty 4

## 2015-11-01 MED ORDER — GADOBENATE DIMEGLUMINE 529 MG/ML IV SOLN
20.0000 mL | Freq: Once | INTRAVENOUS | Status: AC | PRN
  Administered 2015-11-01: 16 mL via INTRAVENOUS

## 2015-11-01 MED ORDER — HEPARIN BOLUS VIA INFUSION
1200.0000 [IU] | Freq: Once | INTRAVENOUS | Status: AC
  Administered 2015-11-01: 1200 [IU] via INTRAVENOUS
  Filled 2015-11-01: qty 1200

## 2015-11-01 NOTE — Progress Notes (Signed)
PT Cancellation Note  Patient Details Name: Daniel [name] MRN: [id number] DOB: 05/19/1951   Cancelled Treatment:    Reason Eval/Treat Not Completed: Other (comment) (Consult received and chart reviewed.  Clarified WBing orders and need for offloading shoe with Dr. Ether GriffinsFowler (heel WBing with ortho-wedge shoe).  Evaluation attempted, but patient currently refusing, stating unable to tolerate activity due to R foot pain.  Prefers to wait until next date if able.  Will re-attempt next date as medically appropriate.)   Carleah Yablonski H. Manson PasseyBrown, PT, DPT, NCS 11/01/2015, 2:19 PM [phone]

## 2015-11-01 NOTE — Consult Note (Signed)
[facility]     Reason for Consult:Bacteremia, osteomyelitis, DM Foot infecton    Referring Physician: Sudini,  Date of Admission:  10/30/2015   Active Problems:   Unstable angina (HCC)   HPI: Daniel [name] is a 64 y.o. male with very poorly controlled DM, CAD HTN admitted with R toe pain and swelling as well as SOB, CP, L eye blindness.   He reports sore on great toe has been present for about 3 weeks. Denies injury to it.  At admit had wbc 12, has been afebrile. He has had MRI showing possible early osteomyelitis. Has been seen by podiatry. Vascular eval is pending.   BCX + staph and strep. Has been on vanco and unasyn. Reports foot not much better   Past Medical History  Diagnosis Date  . Diabetes mellitus without complication ([place])   . Stented coronary artery   . Myocardial infarct ([place])   . Hypertension   . Coronary artery disease    History reviewed. No pertinent past surgical history. Social History  Substance Use Topics  . Smoking status: Never Smoker   . Smokeless tobacco: None  . Alcohol Use: No   History reviewed. No pertinent family history.  Allergies: No Known Allergies  Current antibiotics: Antibiotics Given (last 72 hours)    Date/Time Action Medication Dose Rate   10/31/15 0555 Given   vancomycin (VANCOCIN) IVPB 1000 mg/200 mL premix 1,000 mg 200 mL/hr   10/31/15 1210 Given   Ampicillin-Sulbactam (UNASYN) 3 g in sodium chloride 0.9 % 100 mL IVPB 3 g 100 mL/hr   10/31/15 1707 Given   vancomycin (VANCOCIN) IVPB 1000 mg/200 mL premix 1,000 mg 200 mL/hr   10/31/15 1806 Given   Ampicillin-Sulbactam (UNASYN) 3 g in sodium chloride 0.9 % 100 mL IVPB 3 g 100 mL/hr   10/31/15 2311 Given   Ampicillin-Sulbactam (UNASYN) 3 g in sodium chloride 0.9 % 100 mL IVPB 3 g 100 mL/hr   11/01/15 0524 Given   vancomycin (VANCOCIN) IVPB 1000 mg/200 mL premix 1,000 mg 200 mL/hr   11/01/15 0370 Given   Ampicillin-Sulbactam (UNASYN) 3 g in sodium chloride  0.9 % 100 mL IVPB 3 g 100 mL/hr   11/01/15 1039 Given   Ampicillin-Sulbactam (UNASYN) 3 g in sodium chloride 0.9 % 100 mL IVPB 3 g 100 mL/hr      MEDICATIONS: . ampicillin-sulbactam (UNASYN) IV  3 g Intravenous Q6H  . aspirin EC  81 mg Oral Daily  . atorvastatin  40 mg Oral QHS  . furosemide  40 mg Intravenous Q12H  . glipiZIDE  10 mg Oral BID AC  . insulin aspart  0-9 Units Subcutaneous TID AC & HS  . losartan  100 mg Oral Daily  . metoprolol tartrate  25 mg Oral BID  . pantoprazole  40 mg Oral QAC breakfast  . pregabalin  75 mg Oral BID  . vancomycin  1,000 mg Intravenous Q12H    Review of Systems - 11 systems reviewed and negative per HPI   OBJECTIVE: Temp:  [97.6 F (36.4 C)-98.9 F (37.2 C)] 97.6 F (36.4 C) (12/27 1120) Pulse Rate:  [81-87] 81 (12/27 1120) Resp:  [18-21] 18 (12/27 1120) BP: (122-152)/(80-98) 152/82 mmHg (12/27 1120) SpO2:  [94 %-100 %] 100 % (12/27 1120) Weight:  [80.287 kg (177 lb)] 80.287 kg (177 lb) (12/27 0531) Physical Exam  Constitutional: He is oriented to person, place, and time. dishelved HENT: PERRLA< EOMI, anicteric Mouth/Throat: Oropharynx is clear and moist. No oropharyngeal  exudate.  Cardiovascular: Normal rate, regular rhythm and normal heart sounds.  Pulmonary/Chest: Effort normal and breath sounds normal. No respiratory distress. He has no wheezes.  Abdominal: Soft. Bowel sounds are normal. He exhibits no distension. There is no tenderness.  Lymphadenopathy: He has no cervical adenopathy.  Neurological: He is alert and oriented to person, place, and time.  Skin: R great toe with dark eschar with surrounding redness, has edema and erythema over dorsum of foot to mid foot.  Psychiatric: He has a normal mood and affect. His behavior is normal.   LABS: Results for orders placed or performed during the hospital encounter of 10/30/15 (from the past 48 hour(s))  Urinalysis complete, with microscopic     Status: Abnormal   Collection  Time: 10/30/15  3:41 PM  Result Value Ref Range   Color, Urine STRAW (A) YELLOW   APPearance CLEAR (A) CLEAR   Glucose, UA >500 (A) NEGATIVE mg/dL   Bilirubin Urine NEGATIVE NEGATIVE   Ketones, ur 1+ (A) NEGATIVE mg/dL   Specific Gravity, Urine 1.012 1.005 - 1.030   Hgb urine dipstick 2+ (A) NEGATIVE   pH 5.0 5.0 - 8.0   Protein, ur 100 (A) NEGATIVE mg/dL   Nitrite NEGATIVE NEGATIVE   Leukocytes, UA NEGATIVE NEGATIVE   RBC / HPF 0-5 0 - 5 RBC/hpf   WBC, UA 0-5 0 - 5 WBC/hpf   Bacteria, UA NONE SEEN NONE SEEN   Squamous Epithelial / LPF NONE SEEN NONE SEEN  Culture, blood (routine x 2)     Status: None (Preliminary result)   Collection Time: 10/30/15  4:44 PM  Result Value Ref Range   Specimen Description BLOOD LEFT WRIST    Special Requests BOTTLES DRAWN AEROBIC AND ANAEROBIC  2CC    Culture  Setup Time      GRAM POSITIVE COCCI IN CLUSTERS AEROBIC BOTTLE ONLY CRITICAL RESULT CALLED TO, READ BACK BY AND VERIFIED WITH: JASON ROBBINS AT 5102 ON 10/31/15 CTJ    Culture      STAPHYLOCOCCUS AUREUS AEROBIC BOTTLE ONLY SUSCEPTIBILITIES TO FOLLOW    Report Status PENDING   Blood Culture ID Panel (Reflexed)     Status: Abnormal   Collection Time: 10/30/15  4:44 PM  Result Value Ref Range   Enterococcus species NOT DETECTED NOT DETECTED   Listeria monocytogenes NOT DETECTED NOT DETECTED   Staphylococcus species DETECTED (A) NOT DETECTED    Comment: CORRECTED ON 12/27 AT 0801: PREVIOUSLY REPORTED AS NOT DETECTED   Staphylococcus aureus DETECTED (A) NOT DETECTED    Comment: CRITICAL RESULT CALLED TO, READ BACK BY AND VERIFIED WITH: JASON ROBBINS AT 1710 ON 10/31/15 CTJ    Streptococcus species NOT DETECTED NOT DETECTED   Streptococcus agalactiae NOT DETECTED NOT DETECTED   Streptococcus pneumoniae NOT DETECTED NOT DETECTED   Streptococcus pyogenes NOT DETECTED NOT DETECTED   Acinetobacter baumannii NOT DETECTED NOT DETECTED   Enterobacteriaceae species NOT DETECTED NOT DETECTED    Enterobacter cloacae complex NOT DETECTED NOT DETECTED   Escherichia coli NOT DETECTED NOT DETECTED   Klebsiella oxytoca NOT DETECTED NOT DETECTED   Klebsiella pneumoniae NOT DETECTED NOT DETECTED   Proteus species NOT DETECTED NOT DETECTED   Serratia marcescens NOT DETECTED NOT DETECTED   Haemophilus influenzae NOT DETECTED NOT DETECTED   Neisseria meningitidis NOT DETECTED NOT DETECTED   Pseudomonas aeruginosa NOT DETECTED NOT DETECTED   Candida albicans NOT DETECTED NOT DETECTED   Candida glabrata NOT DETECTED NOT DETECTED   Candida krusei NOT DETECTED NOT  DETECTED   Candida parapsilosis NOT DETECTED NOT DETECTED   Candida tropicalis NOT DETECTED NOT DETECTED   Carbapenem resistance NOT DETECTED NOT DETECTED   Methicillin resistance NOT DETECTED NOT DETECTED   Vancomycin resistance NOT DETECTED NOT DETECTED  Culture, blood (routine x 2)     Status: None (Preliminary result)   Collection Time: 10/30/15  5:31 PM  Result Value Ref Range   Specimen Description BLOOD LEFT HAND    Special Requests BOTTLES DRAWN AEROBIC AND ANAEROBIC  2CC    Culture  Setup Time      GRAM POSITIVE COCCI IN CHAINS IN BOTH AEROBIC AND ANAEROBIC BOTTLES CRITICAL RESULT CALLED TO, READ BACK BY AND VERIFIED WITH: KAREN HAYES AT 2671 10/31/15 CTJ    Culture      STREPTOCOCCUS SPECIES IN BOTH AEROBIC AND ANAEROBIC BOTTLES    Report Status PENDING   Blood Culture ID Panel (Reflexed)     Status: Abnormal   Collection Time: 10/30/15  5:31 PM  Result Value Ref Range   Enterococcus species NOT DETECTED NOT DETECTED   Listeria monocytogenes NOT DETECTED NOT DETECTED   Staphylococcus species NOT DETECTED NOT DETECTED   Staphylococcus aureus NOT DETECTED NOT DETECTED   Streptococcus species DETECTED (A) NOT DETECTED    Comment: CRITICAL RESULT CALLED TO, READ BACK BY AND VERIFIED WITH: KAREN HAYES AT 1045 ON 10/31/15 CTJ    Streptococcus agalactiae NOT DETECTED NOT DETECTED   Streptococcus pneumoniae NOT  DETECTED NOT DETECTED   Streptococcus pyogenes NOT DETECTED NOT DETECTED   Acinetobacter baumannii NOT DETECTED NOT DETECTED   Enterobacteriaceae species NOT DETECTED NOT DETECTED   Enterobacter cloacae complex NOT DETECTED NOT DETECTED   Escherichia coli NOT DETECTED NOT DETECTED   Klebsiella oxytoca NOT DETECTED NOT DETECTED   Klebsiella pneumoniae NOT DETECTED NOT DETECTED   Proteus species NOT DETECTED NOT DETECTED   Serratia marcescens NOT DETECTED NOT DETECTED   Haemophilus influenzae NOT DETECTED NOT DETECTED   Neisseria meningitidis NOT DETECTED NOT DETECTED   Pseudomonas aeruginosa NOT DETECTED NOT DETECTED   Candida albicans NOT DETECTED NOT DETECTED   Candida glabrata NOT DETECTED NOT DETECTED   Candida krusei NOT DETECTED NOT DETECTED   Candida parapsilosis NOT DETECTED NOT DETECTED   Candida tropicalis NOT DETECTED NOT DETECTED   Carbapenem resistance NOT DETECTED NOT DETECTED   Methicillin resistance NOT DETECTED NOT DETECTED   Vancomycin resistance NOT DETECTED NOT DETECTED  Brain natriuretic peptide     Status: Abnormal   Collection Time: 10/30/15  5:32 PM  Result Value Ref Range   B Natriuretic Peptide 1240.0 (H) 0.0 - 100.0 pg/mL  CBC with Differential/Platelet     Status: Abnormal   Collection Time: 10/30/15  5:32 PM  Result Value Ref Range   WBC 12.0 (H) 3.8 - 10.6 K/uL   RBC 4.44 4.40 - 5.90 MIL/uL   Hemoglobin 13.5 13.0 - 18.0 g/dL   HCT 40.3 40.0 - 52.0 %   MCV 90.8 80.0 - 100.0 fL   MCH 30.4 26.0 - 34.0 pg   MCHC 33.5 32.0 - 36.0 g/dL   RDW 14.2 11.5 - 14.5 %   Platelets 207 150 - 440 K/uL   Neutrophils Relative % 82 %   Neutro Abs 10.0 (H) 1.4 - 6.5 K/uL   Lymphocytes Relative 8 %   Lymphs Abs 0.9 (L) 1.0 - 3.6 K/uL   Monocytes Relative 10 %   Monocytes Absolute 1.1 (H) 0.2 - 1.0 K/uL   Eosinophils Relative 0 %  Eosinophils Absolute 0.0 0 - 0.7 K/uL   Basophils Relative 0 %   Basophils Absolute 0.0 0 - 0.1 K/uL  Heparin level (unfractionated)      Status: Abnormal   Collection Time: 10/30/15 10:53 PM  Result Value Ref Range   Heparin Unfractionated 0.10 (L) 0.30 - 0.70 IU/mL    Comment:        IF HEPARIN RESULTS ARE BELOW EXPECTED VALUES, AND PATIENT DOSAGE HAS BEEN CONFIRMED, SUGGEST FOLLOW UP TESTING OF ANTITHROMBIN III LEVELS.   Troponin I (q 6hr x 3)     Status: Abnormal   Collection Time: 10/30/15 10:53 PM  Result Value Ref Range   Troponin I 0.66 (H) <0.031 ng/mL    Comment: PREVIOUS RESULT CALLED BY SDR AT 1516 ON 10/30/15 RWW        POSSIBLE MYOCARDIAL ISCHEMIA. SERIAL TESTING RECOMMENDED.   Glucose, capillary     Status: Abnormal   Collection Time: 10/30/15 11:28 PM  Result Value Ref Range   Glucose-Capillary 381 (H) 65 - 99 mg/dL  CBC with Differential/Platelet     Status: Abnormal   Collection Time: 10/31/15  5:40 AM  Result Value Ref Range   WBC 10.3 3.8 - 10.6 K/uL   RBC 4.13 (L) 4.40 - 5.90 MIL/uL   Hemoglobin 12.5 (L) 13.0 - 18.0 g/dL   HCT 36.8 (L) 40.0 - 52.0 %   MCV 89.0 80.0 - 100.0 fL   MCH 30.2 26.0 - 34.0 pg   MCHC 34.0 32.0 - 36.0 g/dL   RDW 14.5 11.5 - 14.5 %   Platelets 184 150 - 440 K/uL   Neutrophils Relative % 74 %   Neutro Abs 7.6 (H) 1.4 - 6.5 K/uL   Lymphocytes Relative 13 %   Lymphs Abs 1.4 1.0 - 3.6 K/uL   Monocytes Relative 13 %   Monocytes Absolute 1.3 (H) 0.2 - 1.0 K/uL   Eosinophils Relative 0 %   Eosinophils Absolute 0.0 0 - 0.7 K/uL   Basophils Relative 0 %   Basophils Absolute 0.0 0 - 0.1 K/uL  Troponin I (q 6hr x 3)     Status: Abnormal   Collection Time: 10/31/15  5:40 AM  Result Value Ref Range   Troponin I 0.82 (H) <0.031 ng/mL    Comment: PREVIOUS RESULT CALLED AT 1516 10/30/15.PMH        POSSIBLE MYOCARDIAL ISCHEMIA. SERIAL TESTING RECOMMENDED.   Hemoglobin A1c     Status: Abnormal   Collection Time: 10/31/15  5:40 AM  Result Value Ref Range   Hgb A1c MFr Bld 12.2 (H) 4.0 - 6.0 %  TSH     Status: None   Collection Time: 10/31/15  5:40 AM  Result  Value Ref Range   TSH 1.595 0.350 - 4.500 uIU/mL  Basic metabolic panel     Status: Abnormal   Collection Time: 10/31/15  5:40 AM  Result Value Ref Range   Sodium 138 135 - 145 mmol/L   Potassium 3.8 3.5 - 5.1 mmol/L   Chloride 101 101 - 111 mmol/L   CO2 28 22 - 32 mmol/L   Glucose, Bld 215 (H) 65 - 99 mg/dL   BUN 27 (H) 6 - 20 mg/dL   Creatinine, Ser 1.49 (H) 0.61 - 1.24 mg/dL   Calcium 8.1 (L) 8.9 - 10.3 mg/dL   GFR calc non Af Amer 48 (L) >60 mL/min   GFR calc Af Amer 56 (L) >60 mL/min    Comment: (NOTE) The eGFR has been  calculated using the CKD EPI equation. This calculation has not been validated in all clinical situations. eGFR's persistently <60 mL/min signify possible Chronic Kidney Disease.    Anion gap 9 5 - 15  Protime-INR     Status: Abnormal   Collection Time: 10/31/15  5:40 AM  Result Value Ref Range   Prothrombin Time 15.7 (H) 11.4 - 15.0 seconds   INR 1.23   Troponin I (q 6hr x 3)     Status: Abnormal   Collection Time: 10/31/15 11:35 AM  Result Value Ref Range   Troponin I 0.71 (H) <0.031 ng/mL    Comment: PREVIOUS RESULT CALLED 10/30/15 AT 1516 BY SDR/DAS        POSSIBLE MYOCARDIAL ISCHEMIA. SERIAL TESTING RECOMMENDED.   Heparin level (unfractionated)     Status: Abnormal   Collection Time: 10/31/15 11:35 AM  Result Value Ref Range   Heparin Unfractionated 0.12 (L) 0.30 - 0.70 IU/mL    Comment:        IF HEPARIN RESULTS ARE BELOW EXPECTED VALUES, AND PATIENT DOSAGE HAS BEEN CONFIRMED, SUGGEST FOLLOW UP TESTING OF ANTITHROMBIN III LEVELS.   Glucose, capillary     Status: Abnormal   Collection Time: 10/31/15 11:57 AM  Result Value Ref Range   Glucose-Capillary 198 (H) 65 - 99 mg/dL   Comment 1 Notify RN   Wound culture     Status: None (Preliminary result)   Collection Time: 10/31/15  2:12 PM  Result Value Ref Range   Specimen Description ULCER    Special Requests NONE    Gram Stain PENDING    Culture      MODERATE GROWTH GRAM NEGATIVE  RODS IDENTIFICATION AND SUSCEPTIBILITIES TO FOLLOW    Report Status PENDING   Glucose, capillary     Status: Abnormal   Collection Time: 10/31/15  4:58 PM  Result Value Ref Range   Glucose-Capillary 198 (H) 65 - 99 mg/dL   Comment 1 Notify RN   Heparin level (unfractionated)     Status: None   Collection Time: 10/31/15  8:24 PM  Result Value Ref Range   Heparin Unfractionated 0.31 0.30 - 0.70 IU/mL    Comment:        IF HEPARIN RESULTS ARE BELOW EXPECTED VALUES, AND PATIENT DOSAGE HAS BEEN CONFIRMED, SUGGEST FOLLOW UP TESTING OF ANTITHROMBIN III LEVELS.   Glucose, capillary     Status: Abnormal   Collection Time: 10/31/15  8:45 PM  Result Value Ref Range   Glucose-Capillary 174 (H) 65 - 99 mg/dL  CBC     Status: Abnormal   Collection Time: 11/01/15  2:47 AM  Result Value Ref Range   WBC 10.2 3.8 - 10.6 K/uL   RBC 4.06 (L) 4.40 - 5.90 MIL/uL   Hemoglobin 12.1 (L) 13.0 - 18.0 g/dL   HCT 36.1 (L) 40.0 - 52.0 %   MCV 89.0 80.0 - 100.0 fL   MCH 29.8 26.0 - 34.0 pg   MCHC 33.5 32.0 - 36.0 g/dL   RDW 14.5 11.5 - 14.5 %   Platelets 205 150 - 440 K/uL  Basic metabolic panel     Status: Abnormal   Collection Time: 11/01/15  2:47 AM  Result Value Ref Range   Sodium 137 135 - 145 mmol/L   Potassium 3.5 3.5 - 5.1 mmol/L   Chloride 99 (L) 101 - 111 mmol/L   CO2 26 22 - 32 mmol/L   Glucose, Bld 187 (H) 65 - 99 mg/dL   BUN 41 (H)  6 - 20 mg/dL   Creatinine, Ser 1.34 (H) 0.61 - 1.24 mg/dL   Calcium 8.0 (L) 8.9 - 10.3 mg/dL   GFR calc non Af Amer 55 (L) >60 mL/min   GFR calc Af Amer >60 >60 mL/min    Comment: (NOTE) The eGFR has been calculated using the CKD EPI equation. This calculation has not been validated in all clinical situations. eGFR's persistently <60 mL/min signify possible Chronic Kidney Disease.    Anion gap 12 5 - 15  Heparin level (unfractionated)     Status: Abnormal   Collection Time: 11/01/15  2:47 AM  Result Value Ref Range   Heparin Unfractionated 0.28 (L)  0.30 - 0.70 IU/mL    Comment:        IF HEPARIN RESULTS ARE BELOW EXPECTED VALUES, AND PATIENT DOSAGE HAS BEEN CONFIRMED, SUGGEST FOLLOW UP TESTING OF ANTITHROMBIN III LEVELS.   Glucose, capillary     Status: Abnormal   Collection Time: 11/01/15  7:43 AM  Result Value Ref Range   Glucose-Capillary 190 (H) 65 - 99 mg/dL   Comment 1 Notify RN   Heparin level (unfractionated)     Status: Abnormal   Collection Time: 11/01/15 10:59 AM  Result Value Ref Range   Heparin Unfractionated <0.10 (L) 0.30 - 0.70 IU/mL    Comment:        IF HEPARIN RESULTS ARE BELOW EXPECTED VALUES, AND PATIENT DOSAGE HAS BEEN CONFIRMED, SUGGEST FOLLOW UP TESTING OF ANTITHROMBIN III LEVELS.   Glucose, capillary     Status: Abnormal   Collection Time: 11/01/15 12:00 PM  Result Value Ref Range   Glucose-Capillary 304 (H) 65 - 99 mg/dL   Comment 1 Notify RN    No components found for: ESR, C REACTIVE PROTEIN MICRO: Recent Results (from the past 720 hour(s))  Culture, blood (routine x 2)     Status: None (Preliminary result)   Collection Time: 10/30/15  4:44 PM  Result Value Ref Range Status   Specimen Description BLOOD LEFT WRIST  Final   Special Requests BOTTLES DRAWN AEROBIC AND ANAEROBIC  2CC  Final   Culture  Setup Time   Final    GRAM POSITIVE COCCI IN CLUSTERS AEROBIC BOTTLE ONLY CRITICAL RESULT CALLED TO, READ BACK BY AND VERIFIED WITH: JASON ROBBINS AT 0960 ON 10/31/15 CTJ    Culture   Final    STAPHYLOCOCCUS AUREUS AEROBIC BOTTLE ONLY SUSCEPTIBILITIES TO FOLLOW    Report Status PENDING  Incomplete  Blood Culture ID Panel (Reflexed)     Status: Abnormal   Collection Time: 10/30/15  4:44 PM  Result Value Ref Range Status   Enterococcus species NOT DETECTED NOT DETECTED Final   Listeria monocytogenes NOT DETECTED NOT DETECTED Final   Staphylococcus species DETECTED (A) NOT DETECTED Corrected    Comment: CORRECTED ON 12/27 AT 0801: PREVIOUSLY REPORTED AS NOT DETECTED   Staphylococcus  aureus DETECTED (A) NOT DETECTED Final    Comment: CRITICAL RESULT CALLED TO, READ BACK BY AND VERIFIED WITH: JASON ROBBINS AT 1710 ON 10/31/15 CTJ    Streptococcus species NOT DETECTED NOT DETECTED Final   Streptococcus agalactiae NOT DETECTED NOT DETECTED Final   Streptococcus pneumoniae NOT DETECTED NOT DETECTED Final   Streptococcus pyogenes NOT DETECTED NOT DETECTED Final   Acinetobacter baumannii NOT DETECTED NOT DETECTED Final   Enterobacteriaceae species NOT DETECTED NOT DETECTED Final   Enterobacter cloacae complex NOT DETECTED NOT DETECTED Final   Escherichia coli NOT DETECTED NOT DETECTED Final   Klebsiella oxytoca  NOT DETECTED NOT DETECTED Final   Klebsiella pneumoniae NOT DETECTED NOT DETECTED Final   Proteus species NOT DETECTED NOT DETECTED Final   Serratia marcescens NOT DETECTED NOT DETECTED Final   Haemophilus influenzae NOT DETECTED NOT DETECTED Final   Neisseria meningitidis NOT DETECTED NOT DETECTED Final   Pseudomonas aeruginosa NOT DETECTED NOT DETECTED Final   Candida albicans NOT DETECTED NOT DETECTED Final   Candida glabrata NOT DETECTED NOT DETECTED Final   Candida krusei NOT DETECTED NOT DETECTED Final   Candida parapsilosis NOT DETECTED NOT DETECTED Final   Candida tropicalis NOT DETECTED NOT DETECTED Final   Carbapenem resistance NOT DETECTED NOT DETECTED Final   Methicillin resistance NOT DETECTED NOT DETECTED Final   Vancomycin resistance NOT DETECTED NOT DETECTED Final  Culture, blood (routine x 2)     Status: None (Preliminary result)   Collection Time: 10/30/15  5:31 PM  Result Value Ref Range Status   Specimen Description BLOOD LEFT HAND  Final   Special Requests BOTTLES DRAWN AEROBIC AND ANAEROBIC  2CC  Final   Culture  Setup Time   Final    GRAM POSITIVE COCCI IN CHAINS IN BOTH AEROBIC AND ANAEROBIC BOTTLES CRITICAL RESULT CALLED TO, READ BACK BY AND VERIFIED WITH: KAREN HAYES AT 0786 10/31/15 CTJ    Culture   Final    STREPTOCOCCUS  SPECIES IN BOTH AEROBIC AND ANAEROBIC BOTTLES    Report Status PENDING  Incomplete  Blood Culture ID Panel (Reflexed)     Status: Abnormal   Collection Time: 10/30/15  5:31 PM  Result Value Ref Range Status   Enterococcus species NOT DETECTED NOT DETECTED Final   Listeria monocytogenes NOT DETECTED NOT DETECTED Final   Staphylococcus species NOT DETECTED NOT DETECTED Final   Staphylococcus aureus NOT DETECTED NOT DETECTED Final   Streptococcus species DETECTED (A) NOT DETECTED Final    Comment: CRITICAL RESULT CALLED TO, READ BACK BY AND VERIFIED WITH: KAREN HAYES AT 1045 ON 10/31/15 CTJ    Streptococcus agalactiae NOT DETECTED NOT DETECTED Final   Streptococcus pneumoniae NOT DETECTED NOT DETECTED Final   Streptococcus pyogenes NOT DETECTED NOT DETECTED Final   Acinetobacter baumannii NOT DETECTED NOT DETECTED Final   Enterobacteriaceae species NOT DETECTED NOT DETECTED Final   Enterobacter cloacae complex NOT DETECTED NOT DETECTED Final   Escherichia coli NOT DETECTED NOT DETECTED Final   Klebsiella oxytoca NOT DETECTED NOT DETECTED Final   Klebsiella pneumoniae NOT DETECTED NOT DETECTED Final   Proteus species NOT DETECTED NOT DETECTED Final   Serratia marcescens NOT DETECTED NOT DETECTED Final   Haemophilus influenzae NOT DETECTED NOT DETECTED Final   Neisseria meningitidis NOT DETECTED NOT DETECTED Final   Pseudomonas aeruginosa NOT DETECTED NOT DETECTED Final   Candida albicans NOT DETECTED NOT DETECTED Final   Candida glabrata NOT DETECTED NOT DETECTED Final   Candida krusei NOT DETECTED NOT DETECTED Final   Candida parapsilosis NOT DETECTED NOT DETECTED Final   Candida tropicalis NOT DETECTED NOT DETECTED Final   Carbapenem resistance NOT DETECTED NOT DETECTED Final   Methicillin resistance NOT DETECTED NOT DETECTED Final   Vancomycin resistance NOT DETECTED NOT DETECTED Final  Wound culture     Status: None (Preliminary result)   Collection Time: 10/31/15  2:12 PM   Result Value Ref Range Status   Specimen Description ULCER  Final   Special Requests NONE  Final   Gram Stain PENDING  Incomplete   Culture   Final    MODERATE GROWTH GRAM NEGATIVE RODS  IDENTIFICATION AND SUSCEPTIBILITIES TO FOLLOW    Report Status PENDING  Incomplete    IMAGING: Ct Head Wo Contrast  10/30/2015  CLINICAL DATA:  Generalized weakness, diabetes, hypertension, right visual disturbance EXAM: CT HEAD WITHOUT CONTRAST TECHNIQUE: Contiguous axial images were obtained from the base of the skull through the vertex without contrast. COMPARISON:  03/01/2011 FINDINGS: Mild brain atrophy and chronic white matter microvascular ischemic changes throughout the periventricular white matter. These changes have slightly progressed. No acute intracranial hemorrhage, mass lesion, definite infarction, midline shift, herniation, hydrocephalus, or extra-axial fluid collection. Cisterns patent. No cerebellar abnormality. Skull intact. Mastoids and sinuses remain clear. Atherosclerosis of the intracranial vessels. IMPRESSION: Atrophy and slight progression of chronic white matter microvascular ischemic changes. No acute intracranial process by noncontrast CT. Electronically Signed   By: Jerilynn Mages.  Shick M.D.   On: 10/30/2015 18:15   Mr Jeri Cos PX Contrast  11/01/2015  CLINICAL DATA:  64 year old male with progressive lower extremity weakness for 6 months. Initial encounter. Diabetes EXAM: MRI HEAD WITHOUT AND WITH CONTRAST TECHNIQUE: Multiplanar, multiecho pulse sequences of the brain and surrounding structures were obtained without and with intravenous contrast. CONTRAST:  83m MULTIHANCE GADOBENATE DIMEGLUMINE 529 MG/ML IV SOLN COMPARISON:  Head CT without contrast 10/30/2015. [facility] postoperative cervical spine radiograph 07/19/2011. FINDINGS: Major intracranial vascular flow voids are preserved. Late subacute to early chronic appearing right corona radiata 10 mm focus of  diffusion signal change (series 100, image 36) with T2 shine through. No restricted diffusion or evidence of acute infarction. Additional patchy bilateral cerebral white matter T2 and FLAIR hyperintensity. Chronic lacunar infarct in the *SCRATCH* SPECT chronic lacunar infarcts in the right thalamus. Mild for age patchy T2 hyperintensity in the pons, but with associated pontine chronic micro hemorrhage (series 9, image 8). No cortical encephalomalacia. No midline shift, mass effect, evidence of mass lesion, ventriculomegaly, extra-axial collection or acute intracranial hemorrhage. Cervicomedullary junction and pituitary are within normal limits. Negative visualized cervical spine aside from hardware susceptibility artifact. No abnormal enhancement identified. Incidental anterior right frontal lobe developmental venous anomaly (normal anatomic variant). Visible internal auditory structures appear normal. Mastoids and paranasal sinuses are clear. Postoperative changes to the globes. Otherwise negative scalp and orbits soft tissues. Normal bone marrow signal. IMPRESSION: No acute intracranial abnormality. Moderate for age chronic small vessel disease with late subacute lacune in the right corona radiata. Electronically Signed   By: HGenevie AnnM.D.   On: 11/01/2015 10:19   Mr Foot Right Wo Contrast  11/01/2015  CLINICAL DATA:  Ulceration in a about 7 mm along the medial right great toe with purulence noted. Erythema extends dorsally in the foot. EXAM: MRI OF THE RIGHT FOREFOOT WITHOUT CONTRAST TECHNIQUE: Multiplanar, multisequence MR imaging was performed. No intravenous contrast was administered. COMPARISON:  10/30/2015 FINDINGS: There is subcutaneous edema diffusely in the foot especially dorsally, and extending into the ankle both medially and laterally. Low-level edema extends into the toes. On image 32 series 4 there is a defect in the skin and subcutaneous tissues medially along the great toe approximately at the  level of the distal phalanx. The on images 23-25 of series 7 there seems to be abnormal increased inversion recovery weighted signal in the base of the distal phalanx suspicious for early osteomyelitis given the immediately adjacent ulceration. Sharply defined erosions or cystic lesions are present along the Lisfranc joint is, including adjacent to the expected attachment site of the Lisfranc ligament to the second metatarsal base. However, there is  no overt malalignment at the Lisfranc joint. Erosions are present at the articulation of the navicular and medial cuneiform. There is thickening of the medial band of the plantar fascia proximally. IMPRESSION: 1. Ulceration medially along the great toe, with underlying edema signal in the marrow of the base of the distal phalanx suspicious for early osteomyelitis. No other osteomyelitis identified. 2. Cystic or erosive lesions along significant portions of the Lisfranc joint and at the articulation of the distal navicular with the medial cuneiform, suspicious for erosive arthropathy such as rheumatoid arthropathy. An alternative would be early Charcot joint. 3. Edema tracking throughout the foot and ankle, especially dorsally, potentially from cellulitis. Electronically Signed   By: Van Clines M.D.   On: 11/01/2015 10:37   Dg Chest Port 1 View  10/30/2015  CLINICAL DATA:  Generalized weakness and elevated blood glucose today. EXAM: PORTABLE CHEST 1 VIEW COMPARISON:  CT chest 04/27/2015. PA and lateral chest 04/27/2015 and 01/22/2015. FINDINGS: Heart size is normal. The lungs are clear. No pneumothorax or pleural effusion. IMPRESSION: No acute disease. Electronically Signed   By: Inge Rise M.D.   On: 10/30/2015 15:14   Dg Toe Great Right  10/30/2015  CLINICAL DATA:  Right toe swelling for 2 weeks.  Diabetic patient. EXAM: RIGHT GREAT TOE COMPARISON:  None. FINDINGS: There is no evidence of fracture or dislocation. There is no evidence of cortical  erosions to suggest osteomyelitis radiographically. Heterogeneous appearance of the bony matrix of the first phalanx is noted, with uncertain significance. There is soft tissue swelling of the dorsal and palmar aspect of the mid and forefoot. IMPRESSION: No evidence of fracture or subluxation. No definitive radiographic evidence of osteomyelitis. Heterogeneous appearance of the bony matrix of the first digit with uncertain significance. Electronically Signed   By: Fidela Salisbury M.D.   On: 10/30/2015 13:51   ECHO 12/26 Study Conclusions  - Left ventricle: The cavity size was severely dilated. Systolic function was severely reduced. The estimated ejection fraction was in the range of 20% to 25%. Diffuse hypokinesis. - Aortic valve: Valve area (Vmax): 2.05 cm^2. - Mitral valve: There was mild regurgitation. - Left atrium: The atrium was mildly dilated. - Right atrium: The atrium was mildly dilated.  Assessment:   Beuford Garcilazo is a 64 y.o. male with staph and strep bacteremia from likely diabetic foot infection and underlying early osteomyeltis. A1c 12.2.  Vascular eval pending. Denies smoking.  [place] with both staph aureus and strep species. Wound cx with GNR.  Recommendations Repeat bcx to document clearance. Check esr, crp and HIV Cont vanco and unasyn pending cultures Will need at least 2 weeks IV abx for the S aureus bacteremia but I suspect will need 6 week total IV abx for the osteomyelitis. If bcx from 12/27 are negative can place picc 12/29. He seems to be unable to care for himself and now has some decreased vision- may benefit from SNF for IV abx  Thank you very much for allowing me to participate in the care of this patient. Please call with questions.   Cheral Marker. Ola Spurr, MD

## 2015-11-01 NOTE — Progress Notes (Signed)
Daily Progress Note   Subjective  - * No surgery found *  F/u right great toe ulcer with infection.  C/o pain to toe.  Objective Filed Vitals:   10/31/15 1711 10/31/15 1927 11/01/15 0531 11/01/15 1120  BP: 122/98 125/81 136/80 152/82  Pulse:  87 83 81  Temp:  98.3 F (36.8 C) 98.9 F (37.2 C) 97.6 F (36.4 C)  TempSrc:  Oral Oral   Resp:  21 18 18   Height:      Weight:   80.287 kg (177 lb)   SpO2: 95% 94% 94% 100%    Physical Exam: Noted erythema somewhat worse today and sligthly more proximal than previous to dorsal midfoot.   Open medial great toe ulceration with scant purulence today.  Non-palpable pulses to right foot today.    Laboratory CBC    Component Value Date/Time   WBC 10.2 11/01/2015 0247   WBC 7.8 09/01/2014 0904   HGB 12.1* 11/01/2015 0247   HGB 16.1 09/01/2014 0904   HCT 36.1* 11/01/2015 0247   HCT 48.6 09/01/2014 0904   PLT 205 11/01/2015 0247   PLT 188 09/01/2014 0904    BMET    Component Value Date/Time   NA 137 11/01/2015 0247   NA 137 09/01/2014 0904   K 3.5 11/01/2015 0247   K 3.9 09/01/2014 0904   CL 99* 11/01/2015 0247   CL 103 09/01/2014 0904   CO2 26 11/01/2015 0247   CO2 24 09/01/2014 0904   GLUCOSE 187* 11/01/2015 0247   GLUCOSE 321* 09/01/2014 0904   BUN 41* 11/01/2015 0247   BUN 15 09/01/2014 0904   CREATININE 1.34* 11/01/2015 0247   CREATININE 0.96 09/01/2014 0904   CALCIUM 8.0* 11/01/2015 0247   CALCIUM 8.1* 09/01/2014 0904   GFRNONAA 55* 11/01/2015 0247   GFRNONAA >60 09/01/2014 0904   GFRNONAA 58* 04/23/2012 1940   GFRAA >60 11/01/2015 0247   GFRAA >60 09/01/2014 0904   GFRAA >60 04/23/2012 1940   MRI: IMPRESSION: 1. Ulceration medially along the great toe, with underlying edema signal in the marrow of the base of the distal phalanx suspicious for early osteomyelitis. No other osteomyelitis identified. 2. Cystic or erosive lesions along significant portions of the Lisfranc joint and at the articulation of  the distal navicular with the medial cuneiform, suspicious for erosive arthropathy such as rheumatoid arthropathy. An alternative would be early Charcot joint. 3. Edema tracking throughout the foot and ankle, especially dorsally, potentially from cellulitis.   Assessment/Planning: Worsening cellulitis to right foot with non-palpable pulses.  MRI suggestive of early osteomyelitis.    Will ask vascular surgery to evaluate  May need operative debridement if continues to worsen.  Dressing changes written.  Gwyneth RevelsFowler, Anitha Kreiser A  11/01/2015, 12:58 PM

## 2015-11-01 NOTE — NC FL2 (Signed)
[place] MEDICAID FL2 LEVEL OF CARE SCREENING TOOL     IDENTIFICATION  Patient Name: Daniel [name] Birthdate: 21-Jul-1951 Sex: male Admission Date (Current Location): 10/30/2015  [place] and [place] Number:  Chiropodist and Address:  [facility], [address], [place], [place] [address]      Provider Number: [id number]  Attending Physician Name and Address:  Milagros Loll, MD  Relative Name and Phone Number:       Current Level of Care: Hospital Recommended Level of Care: Skilled Nursing Facility Prior Approval Number:    Date Approved/Denied:   PASRR Number:  [id number])  Discharge Plan: SNF    Current Diagnoses: Patient Active Problem List   Diagnosis Date Noted  . Unstable angina (HCC) 10/30/2015    Orientation RESPIRATION BLADDER Height & Weight    Self, Time, Place, Situation  O2 (Nasal Cannula 3 (L/min) ) Continent  (172.7 cm) 177 lbs.  BEHAVIORAL SYMPTOMS/MOOD NEUROLOGICAL BOWEL NUTRITION STATUS   (None)  (None) Continent Diet (Heart Healthy/Carb Modified )  AMBULATORY STATUS COMMUNICATION OF NEEDS Skin   Extensive Assist Verbally Other (Comment) (Diabetic ulcer, toe, right, lateral& Diabetic, ulcer foot right distal)                       Personal Care Assistance Level of Assistance  Bathing, Feeding, Dressing Bathing Assistance: Limited assistance Feeding assistance: Independent Dressing Assistance: Limited assistance     Functional Limitations Info  Hearing, Speech, Sight Sight Info: Impaired (Patient is blind in his left eye. ) Hearing Info: Adequate Speech Info: Adequate    SPECIAL CARE FACTORS FREQUENCY  PT (By licensed PT)     PT Frequency:  (5)              Contractures      Additional Factors Info  Code Status, Allergies, Insulin Sliding Scale Code Status Info:  (Full Code) Allergies Info:  (No Known Allergies )   Insulin Sliding Scale Info:  (Novolog)       Current  Medications (11/01/2015):  This is the current hospital active medication list Current Facility-Administered Medications  Medication Dose Route Frequency Provider Last Rate Last Dose  . acetaminophen (TYLENOL) tablet 650 mg  650 mg Oral Q4H PRN Ramonita Lab, MD      . Ampicillin-Sulbactam (UNASYN) 3 g in sodium chloride 0.9 % 100 mL IVPB  3 g Intravenous Q6H Auburn Bilberry, MD   3 g at 11/01/15 1039  . aspirin EC tablet 81 mg  81 mg Oral Daily Ramonita Lab, MD   81 mg at 11/01/15 1034  . atorvastatin (LIPITOR) tablet 40 mg  40 mg Oral QHS Auburn Bilberry, MD   40 mg at 10/31/15 2114  . furosemide (LASIX) injection 40 mg  40 mg Intravenous Q12H Srikar Sudini, MD   40 mg at 11/01/15 1034  . glipiZIDE (GLUCOTROL) tablet 10 mg  10 mg Oral BID AC Auburn Bilberry, MD   10 mg at 11/01/15 1034  . heparin ADULT infusion 100 units/mL (25000 units/250 mL)  1,600 Units/hr Intravenous Continuous Auburn Bilberry, MD 16 mL/hr at 11/01/15 1051 1,600 Units/hr at 11/01/15 1051  . insulin aspart (novoLOG) injection 0-9 Units  0-9 Units Subcutaneous TID AC & HS Oralia Manis, MD   7 Units at 11/01/15 1206  . losartan (COZAAR) tablet 100 mg  100 mg Oral Daily Ramonita Lab, MD   100 mg at 11/01/15 1033  . metoprolol tartrate (LOPRESSOR) tablet 25 mg  25 mg Oral BID Ramonita LabAruna Gouru, MD   25 mg at 11/01/15 1033  . morphine 2 MG/ML injection 2 mg  2 mg Intravenous Q4H PRN Oralia Manisavid Willis, MD   2 mg at 11/01/15 1455  . nitroGLYCERIN (NITROSTAT) SL tablet 0.4 mg  0.4 mg Sublingual Q5 Min x 3 PRN Ramonita LabAruna Gouru, MD      . ondansetron (ZOFRAN) injection 4 mg  4 mg Intravenous Q6H PRN Ramonita LabAruna Gouru, MD      . oxyCODONE (Oxy IR/ROXICODONE) immediate release tablet 5 mg  5 mg Oral Q4H PRN Oralia Manisavid Willis, MD   5 mg at 11/01/15 1206  . pantoprazole (PROTONIX) EC tablet 40 mg  40 mg Oral QAC breakfast Ramonita LabAruna Gouru, MD   40 mg at 11/01/15 1034  . pregabalin (LYRICA) capsule 75 mg  75 mg Oral BID Ramonita LabAruna Gouru, MD   75 mg at 11/01/15 1034  . vancomycin  (VANCOCIN) IVPB 1000 mg/200 mL premix  1,000 mg Intravenous Q12H Ramonita LabAruna Gouru, MD   1,000 mg at 11/01/15 [id number]     Discharge Medications: Please see discharge summary for a list of discharge medications.  Relevant Imaging Results:  Relevant Lab Results:   Additional Information  (SSN: [id number])  Verta Ellenhristina E Ayumi Wangerin, LCSW

## 2015-11-01 NOTE — Progress Notes (Signed)
Around 2330 patient was c/o pain 10/10 at right foot. Oxycodone given at 2115. Informed Dr. Anne HahnWillis about patient's pain level located at right foot. New order morphine 2 mg.

## 2015-11-01 NOTE — Care Management (Signed)
Patient presents from home.  Patient lives at home alone.  [facility] [place].  Patient has a brother who lives locally for support.  Patient uses a rolling walker at home to ambulate.  Patient states that he still drives himself. PT consult pending.  Patient requiring acute O2.  Will need qualifying O2 sats and diagnosis at time of discharge.  RNCM following for disposition

## 2015-11-01 NOTE — Progress Notes (Signed)
ANTICOAGULATION CONSULT NOTE - Initial Consult  Pharmacy Consult for heparin drip Indication: chest pain/ACS  No Known Allergies  Patient Measurements: Height:  (172.7 cm) Weight: 177 lb (80.287 kg) IBW/kg (Calculated) : 68.4 Heparin Dosing Weight: 77.1 kg  Vital Signs: Temp: 97.6 F (36.4 C) (12/27 1120) Temp Source: Oral (12/27 0531) BP: 152/82 mmHg (12/27 1120) Pulse Rate: 81 (12/27 1120)  Recent Labs  10/30/15 1331  10/30/15 1424  10/30/15 1732  10/30/15 2253 10/31/15 0540 10/31/15 1135 10/31/15 2024 11/01/15 0247 11/01/15 1059  HGB  --   --   --   < > 13.5  --   --  12.5*  --   --  12.1*  --   HCT  --   --   --   --  40.3  --   --  36.8*  --   --  36.1*  --   PLT  --   --   --   --  207  --   --  184  --   --  205  --   APTT 32  --   --   --   --   --   --   --   --   --   --   --   LABPROT 14.4  --   --   --   --   --   --  15.7*  --   --   --   --   INR 1.10  --   --   --   --   --   --  1.23  --   --   --   --   HEPARINUNFRC  --   --   --   --   --   < > 0.10*  --  0.12* 0.31 0.28* <0.10*  CREATININE  --   --  0.91  --   --   --   --  1.49*  --   --  1.34*  --   TROPONINI  --   < > 0.33*  --   --   --  0.66* 0.82* 0.71*  --   --   --   < > = values in this interval not displayed.  Estimated Creatinine Clearance: 54.6 mL/min (by C-G formula based on Cr of 1.34).   Medical History: Past Medical History  Diagnosis Date  . Diabetes mellitus without complication (HCC)   . Stented coronary artery   . Myocardial infarct (HCC)   . Hypertension   . Coronary artery disease    Assessment: Pharmacy consulted to dose a heparin drip in this 64 year old male for chest pain/ACS. Patient was not taking anticoagulants prior to admission per medication rec. Baseline CBC, protime/INR, and APTT were obtained in ED and are pending.   Heparin dosing weight = 77.1 kg  Goal of Therapy:  Heparin level 0.3-0.7 units/ml Monitor platelets by anticoagulation protocol:  Yes   Plan:  Heparin level subtherapeutic. 2300 Units IV x 1 bolus and increase rate to 1450 units/hr. Will recheck level in 6 hours.  12/26:  HL @ 20:30 = 0.31 Will draw confirmation level in 6 hrs on 12/27 @ 2:30.   12/27 02:30 heparin level 0.28. 1200 unit bolus and increase rate to 1600 units/hr. Recheck in 6 hours.  12/27 1059: heparin level <0.10 Nurse confirmed heparin drip was off while patient went for MRI this morning. Will redraw heparin level in 6  hours.     Cher NakaiSheema Katura Eatherly, PharmD Pharmacy Resident 11/01/2015,1:38 PM

## 2015-11-01 NOTE — Care Management Important Message (Signed)
Important Message  Patient Details  Name: Daniel [name] MRN: [id number] Date of Birth: 10/18/1951   Medicare Important Message Given:  Yes    [name], RN 11/01/2015, 7:58 AM

## 2015-11-01 NOTE — Progress Notes (Signed)
Clinical Education officer, museum (CSW) met with patient's brother Jori Moll, a Psychologist, occupational at the hospital. Jori Moll voiced concerns about patient going home and inquired about a facility placement. CSW explained that patient is alert and oriented and can make his decisions. Brother agreed that patient can make his own decisions. CSW explained that PT is pending. CSW made brother aware of long term care and Medicaid options. CSW provided brother with [facility] phone number to assist with Medicaid application. CSW will continue to follow and assist as needed.   Blima Rich, [place] [phone]

## 2015-11-01 NOTE — Progress Notes (Signed)
ANTICOAGULATION CONSULT NOTE - Initial Consult  Pharmacy Consult for heparin drip Indication: chest pain/ACS  No Known Allergies  Patient Measurements: Height: 5\' 8"  (172.7 cm) Weight: 178 lb 11.2 oz (81.058 kg) IBW/kg (Calculated) : 68.4 Heparin Dosing Weight: 77.1 kg  Vital Signs: Temp: 98.3 F (36.8 C) (12/26 1927) Temp Source: Oral (12/26 1927) BP: 125/81 mmHg (12/26 1927) Pulse Rate: 87 (12/26 1927)  Labs:  Recent Labs  10/30/15 1331  10/30/15 1424  10/30/15 1732  10/30/15 2253 10/31/15 0540 10/31/15 1135 10/31/15 2024 11/01/15 0247  HGB  --   --   --   < > 13.5  --   --  12.5*  --   --  12.1*  HCT  --   --   --   --  40.3  --   --  36.8*  --   --  36.1*  PLT  --   --   --   --  207  --   --  184  --   --  205  APTT 32  --   --   --   --   --   --   --   --   --   --   LABPROT 14.4  --   --   --   --   --   --  15.7*  --   --   --   INR 1.10  --   --   --   --   --   --  1.23  --   --   --   HEPARINUNFRC  --   --   --   --   --   < > 0.10*  --  0.12* 0.31 0.28*  CREATININE  --   --  0.91  --   --   --   --  1.49*  --   --  1.34*  TROPONINI  --   < > 0.33*  --   --   --  0.66* 0.82* 0.71*  --   --   < > = values in this interval not displayed.  Estimated Creatinine Clearance: 54.6 mL/min (by C-G formula based on Cr of 1.34).   Medical History: Past Medical History  Diagnosis Date  . Diabetes mellitus without complication (HCC)   . Stented coronary artery   . Myocardial infarct (HCC)   . Hypertension   . Coronary artery disease    Assessment: Pharmacy consulted to dose a heparin drip in this 64 year old male for chest pain/ACS. Patient was not taking anticoagulants prior to admission per medication rec. Baseline CBC, protime/INR, and APTT were obtained in ED and are pending.   Heparin dosing weight = 77.1 kg  Goal of Therapy:  Heparin level 0.3-0.7 units/ml Monitor platelets by anticoagulation protocol: Yes   Plan:  Heparin level subtherapeutic.  2300 Units IV x 1 bolus and increase rate to 1450 units/hr. Will recheck level in 6 hours.  12/26:  HL @ 20:30 = 0.31 Will draw confirmation level in 6 hrs on 12/27 @ 2:30.   12/27 02:30 heparin level 0.28. 1200 unit bolus and increase rate to 1600 units/hr. Recheck in 6 hours.   Yaw Escoto S, Pharm.D. Clinical Pharmacist 11/01/2015,3:49 AM

## 2015-11-01 NOTE — Progress Notes (Signed)
Inpatient Diabetes Program Recommendations  AACE/ADA: New Consensus Statement on Inpatient Glycemic Control (2015)  Target Ranges:  Prepandial:   less than 140 mg/dL      Peak postprandial:   less than 180 mg/dL (1-2 hours)      Critically ill patients:  140 - 180 mg/dL  Results for DREYDEN, ROHRMAN (MRN [id number]) as of 11/01/2015 13:43  Ref. Range 10/30/2015 23:28 10/31/2015 11:57 10/31/2015 16:58 10/31/2015 20:45 11/01/2015 07:43 11/01/2015 12:00  Glucose-Capillary Latest Ref Range: 65-99 mg/dL 409 (H) 811 (H) 914 (H) 174 (H) 190 (H) 304 (H)  Results for ADAIAH, MORKEN (MRN [id number]) as of 11/01/2015 13:43  Ref. Range 10/31/2015 05:40  Hemoglobin A1C Latest Ref Range: 4.0-6.0 % 12.2 (H)   Review of Glycemic Control  Diabetes history: DM2 Outpatient Diabetes medications: Glipizide 10 mg BID, Metformin 1000 mg BID, Actos 30 mg daily Current orders for Inpatient glycemic control: Glipizide 10 mg BID, Novolog 0-9 units ACHS  Inpatient Diabetes Program Recommendations: Insulin - Basal: May want to consider ordering low dose basal insulin; recommend starting with Levemir 8 units QHS (base don 80 kg x 0.1 units).  Note: Spoke with patient about diabetes and home regimen for diabetes control. Patient reports that he is followed by his PCP for diabetes management and patient states that currently he takes Metformin 1000 mg BID, Actos 30 mg daily, and Glipizide 10 mg BID as an outpatient for diabetes control. Patient states that he is compliant with taking all DM medications as prescribed. Inquired about knowledge about A1C and patient reports that he does not know what an A1C is. Discussed A1C results (12.2% on 10/31/15) and explained what an A1C is and that his current A1C indicates an average glucose of 306 mg/dl. Discussed basic pathophysiology of DM Type 2, basic home care, importance of checking CBGs and maintaining good CBG control to prevent long-term and short-term complications. Discussed  impact of nutrition, exercise, stress, sickness, and medications on diabetes control.  Patient states that he does not follow any type of diet and he eats out for almost all meals. Noted patient was drinking a regular Mt Dew with lunch. Had patient look at the nutrition label on the drink and note that the 4 oz [facility] he was drinking had 29 grams of carbs.  Discussed carbohydrates, carbohydrate goals per day and meal, along with portion sizes. Patient states that he would like further education on Carb Modified diet along with hand out sample meal plans. Placed consult for RD. Inquired about glucose monitoring and patient reports that he does not routinely check his glucose. Patient states that the last time he checked his glucose was 4 weeks ago. Explained that patient needs to be checking his glucose at least twice a day and he needs to keep a log of glucose readings. Explained how his doctor can use the glucose readings to make adjustments with his DM medications. Patient states that his doctor has mentioned to him that he may need to start using insulin and patient is open to using insulin pens if needed. Patient states that he feels his diabetes would be much better if he would eat the right kinds of foods.  Stressed importance of getting better control of diabetes to prevent further complications from uncontrolled diabetes. Patient verbalized understanding of information discussed and he states that he has no further questions at this time related to diabetes.   Thanks, Orlando Penner, RN, MSN, CDE Diabetes Coordinator Inpatient Diabetes Program [phone] (Team Pager) [phone] (AP  office) [phone] [facility] office) [phone] [facility] office)

## 2015-11-01 NOTE — Progress Notes (Signed)
[facility] - [place] at [facility]                                                                                                                                                                                            Patient Demographics   Daniel [name], is a 64 y.o. male, DOB - [birth year], RUE:[id number]  Admit date - 10/30/2015   Admitting Physician Ramonita Lab, MD  Outpatient Primary MD for the patient is [name],[name], MD   LOS - 2  Subjective: Patient admitted with shortness of breath,  right toe erythema and pain, also left sided new eye visual difficulties. Continues to have pain right foot. SOB improved. No CP. Afebrile.    Review of Systems:   CONSTITUTIONAL: No documented fever. No fatigue, weakness. No weight gain, no weight loss.  EYES: No blurry or double vision. Left eye visual difficulties ENT: No tinnitus. No postnasal drip. No redness of the oropharynx.  RESPIRATORY: No cough, no wheeze, no hemoptysis. Positive dyspnea.  CARDIOVASCULAR: No chest pain. No orthopnea. No palpitations. No syncope.  GASTROINTESTINAL: No nausea, no vomiting or diarrhea. No abdominal pain. No melena or hematochezia.  GENITOURINARY: No dysuria or hematuria.  ENDOCRINE: No polyuria or nocturia. No heat or cold intolerance.  HEMATOLOGY: No anemia. No bruising. No bleeding.  INTEGUMENTARY: No rashes. No lesions.  MUSCULOSKELETAL: No arthritis. No swelling. No gout.  NEUROLOGIC: No numbness, tingling, or ataxia. No seizure-type activity. Generalized weakness PSYCHIATRIC: No anxiety. No insomnia. No ADD.    Vitals:   Filed Vitals:   10/31/15 1711 10/31/15 1927 11/01/15 0531 11/01/15 1120  BP: 122/98 125/81 136/80 152/82  Pulse:  87 83 81  Temp:  98.3 F (36.8 C) 98.9 F (37.2 C) 97.6 F (36.4 C)  TempSrc:  Oral Oral   Resp:  Height:      Weight:   80.287 kg (177 lb)   SpO2: 95% 94% 94% 100%    Wt Readings from Last 3 Encounters:   11/01/15 80.287 kg (177 lb)  04/27/15 74.844 kg (165 lb)     Intake/Output Summary (Last 24 hours) at 11/01/15 1522 Last data filed at 11/01/15 1236  Gross per 24 hour  Intake 1436.34 ml  Output    600 ml  Net 836.34 ml    Physical Exam:   GENERAL: Pleasant-appearing in no apparent distress.  HEAD, EYES, EARS, NOSE AND THROAT: Atraumatic, normocephalic. Extraocular muscles are intact. Pupils equal and reactive to light. Sclerae anicteric. No conjunctival injection. No oro-pharyngeal erythema.  NECK: Supple. There is no jugular venous distention. No bruits, no lymphadenopathy, no  thyromegaly.  HEART: Regular rate and rhythm,. No murmurs, no rubs, no clicks.  LUNGS: Bilateral crackles at the bases ABDOMEN: Soft, flat, nontender, nondistended. Has good bowel sounds. No hepatosplenomegaly appreciated.  EXTREMITIES: No evidence of any cyanosis, clubbing, or peripheral edema.  +2 pedal and radial pulses bilaterally.  NEUROLOGIC: The patient is alert, awake, and oriented x3 with no focal motor deficits appreciated bilaterally. Decreased sensations feet SKIN: Right great toe erythematous surrounding cellulitis  Psych: Not anxious, depressed LN: No inguinal LN enlargement    Antibiotics   Anti-infectives    Start     Dose/Rate Route Frequency Ordered Stop   10/31/15 1130  Ampicillin-Sulbactam (UNASYN) 3 g in sodium chloride 0.9 % 100 mL IVPB     3 g 100 mL/hr over 60 Minutes Intravenous Every 6 hours 10/31/15 1035     10/31/15 0500  vancomycin (VANCOCIN) IVPB 1000 mg/200 mL premix     1,000 mg 200 mL/hr over 60 Minutes Intravenous Every 12 hours 10/30/15 2248     10/30/15 2130  vancomycin (VANCOCIN) IVPB 1000 mg/200 mL premix  Status:  Discontinued     1,000 mg 200 mL/hr over 60 Minutes Intravenous  Once 10/30/15 2120 10/30/15 2141   10/30/15 1645  piperacillin-tazobactam (ZOSYN) IVPB 3.375 g     3.375 g 100 mL/hr over 30 Minutes Intravenous  Once 10/30/15 1633 10/30/15 2019    10/30/15 1645  vancomycin (VANCOCIN) IVPB 1000 mg/200 mL premix     1,000 mg 200 mL/hr over 60 Minutes Intravenous  Once 10/30/15 1633 10/30/15 2019      Medications   Scheduled Meds: . ampicillin-sulbactam (UNASYN) IV  3 g Intravenous Q6H  . aspirin EC  81 mg Oral Daily  . atorvastatin  40 mg Oral QHS  . furosemide  40 mg Intravenous Q12H  . glipiZIDE  10 mg Oral BID AC  . insulin aspart  0-9 Units Subcutaneous TID AC & HS  . losartan  100 mg Oral Daily  . metoprolol tartrate  25 mg Oral BID  . pantoprazole  40 mg Oral QAC breakfast  . pregabalin  75 mg Oral BID  . vancomycin  1,000 mg Intravenous Q12H   Continuous Infusions: . heparin 1,600 Units/hr (11/01/15 1051)   PRN Meds:.acetaminophen, morphine injection, nitroGLYCERIN, ondansetron (ZOFRAN) IV, oxyCODONE   Data Review:   Micro Results Recent Results (from the past 240 hour(s))  Culture, blood (routine x 2)     Status: None (Preliminary result)   Collection Time: 10/30/15  4:44 PM  Result Value Ref Range Status   Specimen Description BLOOD LEFT WRIST  Final   Special Requests BOTTLES DRAWN AEROBIC AND ANAEROBIC  2CC  Final   Culture  Setup Time   Final    GRAM POSITIVE COCCI IN CLUSTERS AEROBIC BOTTLE ONLY CRITICAL RESULT CALLED TO, READ BACK BY AND VERIFIED WITH: JASON ROBBINS AT 1710 ON 10/31/15 CTJ    Culture   Final    STAPHYLOCOCCUS AUREUS AEROBIC BOTTLE ONLY SUSCEPTIBILITIES TO FOLLOW    Report Status PENDING  Incomplete  Blood Culture ID Panel (Reflexed)     Status: Abnormal   Collection Time: 10/30/15  4:44 PM  Result Value Ref Range Status   Enterococcus species NOT DETECTED NOT DETECTED Final   Listeria monocytogenes NOT DETECTED NOT DETECTED Final   Staphylococcus species DETECTED (A) NOT DETECTED Corrected    Comment: CORRECTED ON 12/27 AT 0801: PREVIOUSLY REPORTED AS NOT DETECTED   Staphylococcus aureus DETECTED (A) NOT DETECTED Final  Comment: CRITICAL RESULT CALLED TO, READ BACK BY AND  VERIFIED WITH: JASON ROBBINS AT 1710 ON 10/31/15 CTJ    Streptococcus species NOT DETECTED NOT DETECTED Final   Streptococcus agalactiae NOT DETECTED NOT DETECTED Final   Streptococcus pneumoniae NOT DETECTED NOT DETECTED Final   Streptococcus pyogenes NOT DETECTED NOT DETECTED Final   Acinetobacter baumannii NOT DETECTED NOT DETECTED Final   Enterobacteriaceae species NOT DETECTED NOT DETECTED Final   Enterobacter cloacae complex NOT DETECTED NOT DETECTED Final   Escherichia coli NOT DETECTED NOT DETECTED Final   Klebsiella oxytoca NOT DETECTED NOT DETECTED Final   Klebsiella pneumoniae NOT DETECTED NOT DETECTED Final   Proteus species NOT DETECTED NOT DETECTED Final   Serratia marcescens NOT DETECTED NOT DETECTED Final   Haemophilus influenzae NOT DETECTED NOT DETECTED Final   Neisseria meningitidis NOT DETECTED NOT DETECTED Final   Pseudomonas aeruginosa NOT DETECTED NOT DETECTED Final   Candida albicans NOT DETECTED NOT DETECTED Final   Candida glabrata NOT DETECTED NOT DETECTED Final   Candida krusei NOT DETECTED NOT DETECTED Final   Candida parapsilosis NOT DETECTED NOT DETECTED Final   Candida tropicalis NOT DETECTED NOT DETECTED Final   Carbapenem resistance NOT DETECTED NOT DETECTED Final   Methicillin resistance NOT DETECTED NOT DETECTED Final   Vancomycin resistance NOT DETECTED NOT DETECTED Final  Culture, blood (routine x 2)     Status: None (Preliminary result)   Collection Time: 10/30/15  5:31 PM  Result Value Ref Range Status   Specimen Description BLOOD LEFT HAND  Final   Special Requests BOTTLES DRAWN AEROBIC AND ANAEROBIC  2CC  Final   Culture  Setup Time   Final    GRAM POSITIVE COCCI IN CHAINS IN BOTH AEROBIC AND ANAEROBIC BOTTLES CRITICAL RESULT CALLED TO, READ BACK BY AND VERIFIED WITH: KAREN HAYES AT 1045 10/31/15 CTJ    Culture   Final    STREPTOCOCCUS SPECIES IN BOTH AEROBIC AND ANAEROBIC BOTTLES    Report Status PENDING  Incomplete  Blood Culture ID  Panel (Reflexed)     Status: Abnormal   Collection Time: 10/30/15  5:31 PM  Result Value Ref Range Status   Enterococcus species NOT DETECTED NOT DETECTED Final   Listeria monocytogenes NOT DETECTED NOT DETECTED Final   Staphylococcus species NOT DETECTED NOT DETECTED Final   Staphylococcus aureus NOT DETECTED NOT DETECTED Final   Streptococcus species DETECTED (A) NOT DETECTED Final    Comment: CRITICAL RESULT CALLED TO, READ BACK BY AND VERIFIED WITH: KAREN HAYES AT 1045 ON 10/31/15 CTJ    Streptococcus agalactiae NOT DETECTED NOT DETECTED Final   Streptococcus pneumoniae NOT DETECTED NOT DETECTED Final   Streptococcus pyogenes NOT DETECTED NOT DETECTED Final   Acinetobacter baumannii NOT DETECTED NOT DETECTED Final   Enterobacteriaceae species NOT DETECTED NOT DETECTED Final   Enterobacter cloacae complex NOT DETECTED NOT DETECTED Final   Escherichia coli NOT DETECTED NOT DETECTED Final   Klebsiella oxytoca NOT DETECTED NOT DETECTED Final   Klebsiella pneumoniae NOT DETECTED NOT DETECTED Final   Proteus species NOT DETECTED NOT DETECTED Final   Serratia marcescens NOT DETECTED NOT DETECTED Final   Haemophilus influenzae NOT DETECTED NOT DETECTED Final   Neisseria meningitidis NOT DETECTED NOT DETECTED Final   Pseudomonas aeruginosa NOT DETECTED NOT DETECTED Final   Candida albicans NOT DETECTED NOT DETECTED Final   Candida glabrata NOT DETECTED NOT DETECTED Final   Candida krusei NOT DETECTED NOT DETECTED Final   Candida parapsilosis NOT DETECTED NOT DETECTED Final  Candida tropicalis NOT DETECTED NOT DETECTED Final   Carbapenem resistance NOT DETECTED NOT DETECTED Final   Methicillin resistance NOT DETECTED NOT DETECTED Final   Vancomycin resistance NOT DETECTED NOT DETECTED Final  Wound culture     Status: None (Preliminary result)   Collection Time: 10/31/15  2:12 PM  Result Value Ref Range Status   Specimen Description ULCER  Final   Special Requests NONE  Final   Gram  Stain PENDING  Incomplete   Culture   Final    MODERATE GROWTH GRAM NEGATIVE RODS IDENTIFICATION AND SUSCEPTIBILITIES TO FOLLOW    Report Status PENDING  Incomplete    Radiology Reports Ct Head Wo Contrast  10/30/2015  CLINICAL DATA:  Generalized weakness, diabetes, hypertension, right visual disturbance EXAM: CT HEAD WITHOUT CONTRAST TECHNIQUE: Contiguous axial images were obtained from the base of the skull through the vertex without contrast. COMPARISON:  03/01/2011 FINDINGS: Mild brain atrophy and chronic white matter microvascular ischemic changes throughout the periventricular white matter. These changes have slightly progressed. No acute intracranial hemorrhage, mass lesion, definite infarction, midline shift, herniation, hydrocephalus, or extra-axial fluid collection. Cisterns patent. No cerebellar abnormality. Skull intact. Mastoids and sinuses remain clear. Atherosclerosis of the intracranial vessels. IMPRESSION: Atrophy and slight progression of chronic white matter microvascular ischemic changes. No acute intracranial process by noncontrast CT. Electronically Signed   By: Judie Petit.  Shick M.D.   On: 10/30/2015 18:15   Mr Laqueta Jean RU Contrast  11/01/2015  CLINICAL DATA:  64 year old male with progressive lower extremity weakness for 6 months. Initial encounter. Diabetes EXAM: MRI HEAD WITHOUT AND WITH CONTRAST TECHNIQUE: Multiplanar, multiecho pulse sequences of the brain and surrounding structures were obtained without and with intravenous contrast. CONTRAST:  16mL MULTIHANCE GADOBENATE DIMEGLUMINE 529 MG/ML IV SOLN COMPARISON:  Head CT without contrast 10/30/2015. [facility] postoperative cervical spine radiograph 07/19/2011. FINDINGS: Major intracranial vascular flow voids are preserved. Late subacute to early chronic appearing right corona radiata 10 mm focus of diffusion signal change (series 100, image 36) with T2 shine through. No restricted diffusion or evidence of  acute infarction. Additional patchy bilateral cerebral white matter T2 and FLAIR hyperintensity. Chronic lacunar infarct in the *SCRATCH* SPECT chronic lacunar infarcts in the right thalamus. Mild for age patchy T2 hyperintensity in the pons, but with associated pontine chronic micro hemorrhage (series 9, image 8). No cortical encephalomalacia. No midline shift, mass effect, evidence of mass lesion, ventriculomegaly, extra-axial collection or acute intracranial hemorrhage. Cervicomedullary junction and pituitary are within normal limits. Negative visualized cervical spine aside from hardware susceptibility artifact. No abnormal enhancement identified. Incidental anterior right frontal lobe developmental venous anomaly (normal anatomic variant). Visible internal auditory structures appear normal. Mastoids and paranasal sinuses are clear. Postoperative changes to the globes. Otherwise negative scalp and orbits soft tissues. Normal bone marrow signal. IMPRESSION: No acute intracranial abnormality. Moderate for age chronic small vessel disease with late subacute lacune in the right corona radiata. Electronically Signed   By: Odessa Fleming M.D.   On: 11/01/2015 10:19   Mr Foot Right Wo Contrast  11/01/2015  CLINICAL DATA:  Ulceration in a about 7 mm along the medial right great toe with purulence noted. Erythema extends dorsally in the foot. EXAM: MRI OF THE RIGHT FOREFOOT WITHOUT CONTRAST TECHNIQUE: Multiplanar, multisequence MR imaging was performed. No intravenous contrast was administered. COMPARISON:  10/30/2015 FINDINGS: There is subcutaneous edema diffusely in the foot especially dorsally, and extending into the ankle both medially and laterally. Low-level edema extends into  the toes. On image 32 series 4 there is a defect in the skin and subcutaneous tissues medially along the great toe approximately at the level of the distal phalanx. The on images 23-25 of series 7 there seems to be abnormal increased inversion  recovery weighted signal in the base of the distal phalanx suspicious for early osteomyelitis given the immediately adjacent ulceration. Sharply defined erosions or cystic lesions are present along the Lisfranc joint is, including adjacent to the expected attachment site of the Lisfranc ligament to the second metatarsal base. However, there is no overt malalignment at the Lisfranc joint. Erosions are present at the articulation of the navicular and medial cuneiform. There is thickening of the medial band of the plantar fascia proximally. IMPRESSION: 1. Ulceration medially along the great toe, with underlying edema signal in the marrow of the base of the distal phalanx suspicious for early osteomyelitis. No other osteomyelitis identified. 2. Cystic or erosive lesions along significant portions of the Lisfranc joint and at the articulation of the distal navicular with the medial cuneiform, suspicious for erosive arthropathy such as rheumatoid arthropathy. An alternative would be early Charcot joint. 3. Edema tracking throughout the foot and ankle, especially dorsally, potentially from cellulitis. Electronically Signed   By: Gaylyn Rong M.D.   On: 11/01/2015 10:37   Dg Chest Port 1 View  10/30/2015  CLINICAL DATA:  Generalized weakness and elevated blood glucose today. EXAM: PORTABLE CHEST 1 VIEW COMPARISON:  CT chest 04/27/2015. PA and lateral chest 04/27/2015 and 01/22/2015. FINDINGS: Heart size is normal. The lungs are clear. No pneumothorax or pleural effusion. IMPRESSION: No acute disease. Electronically Signed   By: Drusilla Kanner M.D.   On: 10/30/2015 15:14   Dg Toe Great Right  10/30/2015  CLINICAL DATA:  Right toe swelling for 2 weeks.  Diabetic patient. EXAM: RIGHT GREAT TOE COMPARISON:  None. FINDINGS: There is no evidence of fracture or dislocation. There is no evidence of cortical erosions to suggest osteomyelitis radiographically. Heterogeneous appearance of the bony matrix of the first  phalanx is noted, with uncertain significance. There is soft tissue swelling of the dorsal and palmar aspect of the mid and forefoot. IMPRESSION: No evidence of fracture or subluxation. No definitive radiographic evidence of osteomyelitis. Heterogeneous appearance of the bony matrix of the first digit with uncertain significance. Electronically Signed   By: Ted Mcalpine M.D.   On: 10/30/2015 13:51     CBC  Recent Labs Lab 10/30/15 1732 10/31/15 0540 11/01/15 0247  WBC 12.0* 10.3 10.2  HGB 13.5 12.5* 12.1*  HCT 40.3 36.8* 36.1*  PLT 207 184 205  MCV 90.8 89.0 89.0  MCH 30.4 30.2 29.8  MCHC 33.5 34.0 33.5  RDW 14.2 14.5 14.5  LYMPHSABS 0.9* 1.4  --   MONOABS 1.1* 1.3*  --   EOSABS 0.0 0.0  --   BASOSABS 0.0 0.0  --     Chemistries   Recent Labs Lab 10/30/15 1424 10/31/15 0540 11/01/15 0247  NA 139 138 137  K 3.6 3.8 3.5  CL 99* 101 99*  CO2 25 28 26   GLUCOSE 395* 215* 187*  BUN 18 27* 41*  CREATININE 0.91 1.49* 1.34*  CALCIUM 9.0 8.1* 8.0*  AST 23  --   --   ALT 17  --   --   ALKPHOS 97  --   --   BILITOT 1.3*  --   --    ------------------------------------------------------------------------------------------------------------------ estimated creatinine clearance is 54.6 mL/min (by C-G formula based on  Cr of 1.34). ------------------------------------------------------------------------------------------------------------------  Recent Labs  10/31/15 0540  HGBA1C 12.2*   ------------------------------------------------------------------------------------------------------------------ No results for input(s): CHOL, HDL, LDLCALC, TRIG, CHOLHDL, LDLDIRECT in the last 72 hours. ------------------------------------------------------------------------------------------------------------------  Recent Labs  10/31/15 0540  TSH 1.595   ------------------------------------------------------------------------------------------------------------------ No  results for input(s): VITAMINB12, FOLATE, FERRITIN, TIBC, IRON, RETICCTPCT in the last 72 hours.  Coagulation profile  Recent Labs Lab 10/30/15 1331 10/31/15 0540  INR 1.10 1.23    No results for input(s): DDIMER in the last 72 hours.  Cardiac Enzymes  Recent Labs Lab 10/30/15 2253 10/31/15 0540 10/31/15 1135  TROPONINI 0.66* 0.82* 0.71*   ------------------------------------------------------------------------------------------------------------------ Invalid input(s): POCBNP    Assessment & Plan   # Unstable angina/non-STEMI Continue heparin drip for now, await echocardiogram of the heart has been seen by cardiology. Will likely need some sort ischemic workup.  # Acute hypoxic respiratory failure secondary to acute exacerbation of CHF type unknown  Echocardiogram of the heart pending Lasix IV BID Monitor daily weights and intake and output Continue aspirin, beta blocker and statin  # Right great toe and foot cellulitis with osteomyelitis and bacteremia Continue vancomycin and Unasyn. Patient also has blood cultures which are positive. 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 Shaune Pollack has discussed with on-call ophthalmologist Dr. Duke Salvia, who has recommended outpatient follow-up after discharge  # Diabetes mellitus Sliding scale insulin blood sugars elevated may need long-acting insulin and resume glipizide hold metformin     Code Status Orders        Start     Ordered   10/30/15 2249  Full code   Continuous     10/30/15 2248    Advance Directive Documentation        Most Recent Value   Type of Advance Directive  Healthcare Power of Attorney   Pre-existing out of facility DNR order (yellow form or pink MOST form)     "MOST" Form in Place?        Consults  cardiology DVT Prophylaxis  heparin  Lab Results  Component Value Date    PLT 205 11/01/2015     Time Spent in minutes   35 minutes  Milagros Loll R M.D on 11/01/2015 at 3:22 PM  Between 7am to 6pm - Pager - [phone]  After 6pm go to [email or web address] - password [email or web address] [facility]  [facility]   Office  [phone]

## 2015-11-01 NOTE — Consult Note (Addendum)
[facility] Vascular Consult Note  MRN : [id number]  Daniel [name] is a 64 y.o. ([birth year]) male who presents with chief complaint of  Chief Complaint  Patient presents with  . Weakness  . Hyperglycemia  .  History of Present Illness: The patient relates a history of increasing pain in his forefoot on the right particularly in the great toe. The pain waxes and wanes but overall is continuous. He notes last night it was the worst at several been rating it a 10 out of 10. He states the sore spot has been present for 4-6 weeks. He attempted to dress this area with Neosporin. He denies trauma. He is diabetic and has had diabetes for approximately 10 years. No past foot issues. He denies past vascular procedures no past history of PAD  Current Facility-Administered Medications  Medication Dose Route Frequency Provider Last Rate Last Dose  . acetaminophen (TYLENOL) tablet 650 mg  650 mg Oral Q4H PRN Ramonita Lab, MD      . Ampicillin-Sulbactam (UNASYN) 3 g in sodium chloride 0.9 % 100 mL IVPB  3 g Intravenous Q6H Auburn Bilberry, MD   3 g at 11/01/15 1039  . aspirin EC tablet 81 mg  81 mg Oral Daily Ramonita Lab, MD   81 mg at 11/01/15 1034  . atorvastatin (LIPITOR) tablet 40 mg  40 mg Oral QHS Auburn Bilberry, MD   40 mg at 10/31/15 2114  . furosemide (LASIX) injection 40 mg  40 mg Intravenous Q12H Srikar Sudini, MD   40 mg at 11/01/15 1034  . glipiZIDE (GLUCOTROL) tablet 10 mg  10 mg Oral BID AC Auburn Bilberry, MD   10 mg at 11/01/15 1034  . heparin ADULT infusion 100 units/mL (25000 units/250 mL)  1,600 Units/hr Intravenous Continuous Auburn Bilberry, MD 16 mL/hr at 11/01/15 1051 1,600 Units/hr at 11/01/15 1051  . insulin aspart (novoLOG) injection 0-9 Units  0-9 Units Subcutaneous TID AC & HS Oralia Manis, MD   7 Units at 11/01/15 1206  . losartan (COZAAR) tablet 100 mg  100 mg Oral Daily Ramonita Lab, MD   100 mg at 11/01/15 1033  . metoprolol tartrate (LOPRESSOR) tablet 25  mg  25 mg Oral BID Ramonita Lab, MD   25 mg at 11/01/15 1033  . morphine 2 MG/ML injection 2 mg  2 mg Intravenous Q4H PRN Oralia Manis, MD   2 mg at 11/01/15 1455  . nitroGLYCERIN (NITROSTAT) SL tablet 0.4 mg  0.4 mg Sublingual Q5 Min x 3 PRN Ramonita Lab, MD      . ondansetron (ZOFRAN) injection 4 mg  4 mg Intravenous Q6H PRN Ramonita Lab, MD      . oxyCODONE (Oxy IR/ROXICODONE) immediate release tablet 5 mg  5 mg Oral Q4H PRN Oralia Manis, MD   5 mg at 11/01/15 1206  . pantoprazole (PROTONIX) EC tablet 40 mg  40 mg Oral QAC breakfast Ramonita Lab, MD   40 mg at 11/01/15 1034  . pregabalin (LYRICA) capsule 75 mg  75 mg Oral BID Ramonita Lab, MD   75 mg at 11/01/15 1034  . vancomycin (VANCOCIN) IVPB 1000 mg/200 mL premix  1,000 mg Intravenous Q12H Ramonita Lab, MD   1,000 mg at 11/01/15 7829    Past Medical History  Diagnosis Date  . Diabetes mellitus without complication (HCC)   . Stented coronary artery   . Myocardial infarct (HCC)   . Hypertension   . Coronary artery disease     History reviewed.  No pertinent past surgical history.  Social History Social History  Substance Use Topics  . Smoking status: Never Smoker   . Smokeless tobacco: None  . Alcohol Use: No    Family History History reviewed. No pertinent family history. no family history of porphyria, autoimmune disease or bleeding clotting disorders  No Known Allergies   REVIEW OF SYSTEMS (Negative unless checked)  Constitutional: Weight loss  Fever  Chills Cardiac: Chest pain   Chest pressure   Palpitations   Shortness of breath when laying flat   Shortness of breath at rest   Shortness of breath with exertion. Vascular:  Pain in legs with walking   Pain in legs at rest   Pain in legs when laying flat   Claudication   Pain in feet when walking  Pain in feet at rest  Pain in feet when laying flat   History of DVT   Phlebitis   Swelling in legs   Varicose veins   Non-healing  ulcers Pulmonary:   Uses home oxygen   Productive cough   Hemoptysis   Wheeze  COPD   Asthma Neurologic:  Dizziness  Blackouts   Seizures   History of stroke   History of TIA  Aphasia   Temporary blindness   Dysphagia   Weakness or numbness in arms   Weakness or numbness in legs Musculoskeletal:  Arthritis   Joint swelling   Joint pain   Low back pain Hematologic:  Easy bruising  Easy bleeding   Hypercoagulable state   Anemic  Hepatitis Gastrointestinal:  Blood in stool   Vomiting blood  Gastroesophageal reflux/heartburn   Difficulty swallowing. Genitourinary:  Chronic kidney disease   Difficult urination  Frequent urination  Burning with urination   Blood in urine Skin:  Rashes   Ulcers   Wounds Psychological:  History of anxiety    History of major depression.    Physical Examination  Filed Vitals:   10/31/15 1711 10/31/15 1927 11/01/15 0531 11/01/15 1120  BP: 122/98 125/81 136/80 152/82  Pulse:  87 83 81  Temp:  98.3 F (36.8 C) 98.9 F (37.2 C) 97.6 F (36.4 C)  TempSrc:  Oral Oral   Resp:  Height:      Weight:   80.287 kg (177 lb)   SpO2: 95% 94% 94% 100%   Body mass index is 26.92 kg/(m^2).  Head: /AT, No temporalis wasting. Prominent temp pulse not noted. Ear/Nose/Throat: Nares w/o erythema or drainage, oropharynx w/o obsrtuction, Mallampati score: Class II.  Dentition good.  Eyes: PERRLA, Sclera nonicteric.  Neck: Supple, no nuchal rigidity.  No bruit or JVD.  Pulmonary:  Breath sounds equal bilaterally, no use of accessory muscles.  Cardiac: RRR, normal S1, S2, no Murmurs, rubs or gallops. Vascular: Ischemic foot ulcer right great toe. The right femoral, popliteal, DP or PT pulse is not palpable. The left femoral pulse is 1+ popliteal is not palpable, dorsalis pedis is trace posterior tibial is not palpable. There is 3+ pitting edema of the right foot and 1-2+ pitting edema of the  left foot Gastrointestinal: soft, non-tender, non-distended.  Musculoskeletal: Moves all extremities.  No deformity or atrophy. No edema. Neurologic: CN 2-12 intact. Symmetrical.  Speech is fluent.  Psychiatric: Judgment intact, Mood & affect appropriate for pt's clinical situation. Dermatologic: + rashes and + ulcers noted.  + cellulitis with open wound right great toe. Lymph : No Cervical,  or Inguinal lymphadenopathy.      CBC Lab Results  Component Value Date   WBC 10.2 11/01/2015   HGB 12.1* 11/01/2015   HCT 36.1* 11/01/2015   MCV 89.0 11/01/2015   PLT 205 11/01/2015    BMET    Component Value Date/Time   NA 137 11/01/2015 0247   NA 137 09/01/2014 0904   K 3.5 11/01/2015 0247   K 3.9 09/01/2014 0904   CL 99* 11/01/2015 0247   CL 103 09/01/2014 0904   CO2 26 11/01/2015 0247   CO2 24 09/01/2014 0904   GLUCOSE 187* 11/01/2015 0247   GLUCOSE 321* 09/01/2014 0904   BUN 41* 11/01/2015 0247   BUN 15 09/01/2014 0904   CREATININE 1.34* 11/01/2015 0247   CREATININE 0.96 09/01/2014 0904   CALCIUM 8.0* 11/01/2015 0247   CALCIUM 8.1* 09/01/2014 0904   GFRNONAA 55* 11/01/2015 0247   GFRNONAA >60 09/01/2014 0904   GFRNONAA 58* 04/23/2012 1940   GFRAA >60 11/01/2015 0247   GFRAA >60 09/01/2014 0904   GFRAA >60 04/23/2012 1940   Estimated Creatinine Clearance: 54.6 mL/min (by C-G formula based on Cr of 1.34).  COAG Lab Results  Component Value Date   INR 1.23 10/31/2015   INR 1.10 10/30/2015    Assessment/Plan 1. Atherosclerotic occlusive disease bilateral lower extremities with ulceration of the right great toe. I do believe the patient will need angiography with the hope for intervention for limb salvage. At this point I would continue to treat with antibiotics in hopes that his cellulitis improves I also acknowledge he may need for foot surgery based on the evaluation from the foot and ankle service. At this point I will plan for angiography on Friday, December  30  2. Right great toe cellulitis Provide IV antibiotics podiatry on consult  3. Unstable angina/non-STEMI Admit to telemetry and patient is on heparin drip Cycle cardiac biomarkers Nothing by mouth after midnight [facility] and discussed with Dr. Juliann Paresallwood he is aware Provide nitroglycerin as needed for chest pain Continue aspirin, beta blocker and statin  4. Acute hypoxic respiratory failure secondary to acute exacerbation of CHF/unstable angina Lasix IV every 12 hours Monitor daily weights and intake and output Get echocardiogram Continue aspirin, beta blocker and statin  5. Progressively worsening bilateral lower extremity weakness-. Unlikely stroke CT head is negative Continue aspirin and statin and get MRI of the brain and echocardiogram Neurology consult is placed  6. Diabetes mellitus Sliding scale insulin  7.  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 Shaune PollackLord has discussed with on-call ophthalmologist Dr. Duke Salviaandolph, who has recommended outpatient follow-up after discharge   Schnier, Latina CraverGregory G, MD  11/01/2015 5:33 PM

## 2015-11-01 NOTE — Progress Notes (Signed)
ANTIBIOTIC CONSULT NOTE - FOLLOW UP  Pharmacy Consult for vancomycin Indication: cellulitis/possible osteomyelitis  No Known Allergies  Patient Measurements: Height: 5\' 8"  (172.7 cm) Weight: 177 lb (80.287 kg) IBW/kg (Calculated) : 68.4 Adjusted Body Weight: 71.9 kg  Vital Signs: Temp: 97.6 F (36.4 C) (12/27 1120) BP: 152/82 mmHg (12/27 1120) Pulse Rate: 81 (12/27 1120)  Labs:  Recent Labs  10/30/15 1424 10/30/15 1732 10/31/15 0540 11/01/15 0247  WBC  --  12.0* 10.3 10.2  HGB  --  13.5 12.5* 12.1*  PLT  --  207 184 205  CREATININE 0.91  --  1.49* 1.34*   Estimated Creatinine Clearance: 54.6 mL/min (by C-G formula based on Cr of 1.34).  Recent Labs  11/01/15 1701  VANCOTROUGH 18    Assessment: Pharmacy is dosing vancomycin in this 64 year old male being treated for cellulitis with possible osteomyelitis. Patient is currently on day #3 of antibiotics with vancomycin 1 g IV q 12 hours and ampicillin/sulbactam 3 g IV q 8 hours.  Initial dosing kinetics: Vd 50.3 L, Ke 0.075 hr-1, T1/2 9.3 hr  Vancomycin trough obtained at 1700 was therapeutic at 18 mcg/mL. Will aim for higher trough of 15-20 mcg/mL given concern for possible osteomyelitis. This vancomycin trough was obtained prior to the 5th dose and should represent steady state. Patient is not obese, so not at large risk for accumulation (BMI 25.9).  Goal of Therapy:  Vancomycin trough level 15-20 mcg/ml  Plan:  Will continue with current regimen of vancomycin 1000 mg IV q 12 hours Pharmacy will follow renal function and determine when to draw another trough.   Thank you for the consult.  Cindi CarbonMary M Kwamane Whack, Pharm.D. Clinical Pharmacist 11/01/2015,7:21 PM

## 2015-11-02 LAB — CULTURE, BLOOD (ROUTINE X 2)

## 2015-11-02 LAB — HEPARIN LEVEL (UNFRACTIONATED)

## 2015-11-02 LAB — CREATININE, SERUM
CREATININE: 1.21 mg/dL (ref 0.61–1.24)
GFR calc Af Amer: >60 mL/min (ref >60)
GFR calc non Af Amer: >60 mL/min (ref >60)

## 2015-11-02 LAB — GLUCOSE, CAPILLARY
GLUCOSE-CAPILLARY: 374 mg/dL — AB (ref 65–99)
GLUCOSE-CAPILLARY: 144 mg/dL — AB (ref 65–99)
GLUCOSE-CAPILLARY: 211 mg/dL — AB (ref 65–99)
Glucose-Capillary: 160 mg/dL — ABNORMAL HIGH (ref 65–99)

## 2015-11-02 LAB — C-REACTIVE PROTEIN: CRP: 24.9 mg/dL — ABNORMAL HIGH (ref <1.0)

## 2015-11-02 LAB — SEDIMENTATION RATE: Sed Rate: 9 mm/hr (ref 0–20)

## 2015-11-02 MED ORDER — HYDROMORPHONE HCL 1 MG/ML IJ SOLN
0.5000 mg | Freq: Once | INTRAMUSCULAR | Status: AC
  Administered 2015-11-02: 0.5 mg via INTRAVENOUS

## 2015-11-02 MED ORDER — INSULIN DETEMIR 100 UNIT/ML ~~LOC~~ SOLN
10.0000 [IU] | Freq: Every day | SUBCUTANEOUS | Status: DC
  Administered 2015-11-02: 10 [IU] via SUBCUTANEOUS
  Filled 2015-11-02 (×2): qty 0.1

## 2015-11-02 MED ORDER — SODIUM CHLORIDE 0.9 % IV SOLN
3.0000 g | Freq: Four times a day (QID) | INTRAVENOUS | Status: DC
  Administered 2015-11-02 – 2015-11-08 (×23): 3 g via INTRAVENOUS
  Filled 2015-11-02 (×30): qty 3

## 2015-11-02 MED ORDER — HEPARIN BOLUS VIA INFUSION
2300.0000 [IU] | Freq: Once | INTRAVENOUS | Status: AC
  Administered 2015-11-02: 2300 [IU] via INTRAVENOUS
  Filled 2015-11-02: qty 2300

## 2015-11-02 MED ORDER — FUROSEMIDE 40 MG PO TABS
40.0000 mg | ORAL_TABLET | Freq: Two times a day (BID) | ORAL | Status: DC
Start: 2015-11-02 — End: 2015-11-10
  Administered 2015-11-02 – 2015-11-09 (×13): 40 mg via ORAL
  Filled 2015-11-02 (×13): qty 1

## 2015-11-02 MED ORDER — PIPERACILLIN-TAZOBACTAM 3.375 G IVPB
3.3750 g | Freq: Three times a day (TID) | INTRAVENOUS | Status: DC
  Filled 2015-11-02 (×2): qty 50

## 2015-11-02 MED ORDER — HYDROMORPHONE HCL 1 MG/ML IJ SOLN
1.0000 mg | INTRAMUSCULAR | Status: DC | PRN
  Administered 2015-11-02 – 2015-11-04 (×4): 1 mg via INTRAVENOUS
  Filled 2015-11-02 (×5): qty 1

## 2015-11-02 MED ORDER — METFORMIN HCL 500 MG PO TABS
1000.0000 mg | ORAL_TABLET | Freq: Two times a day (BID) | ORAL | Status: DC
  Administered 2015-11-02 – 2015-11-03 (×3): 1000 mg via ORAL
  Filled 2015-11-02 (×3): qty 2

## 2015-11-02 MED ORDER — ENOXAPARIN SODIUM 40 MG/0.4ML ~~LOC~~ SOLN
40.0000 mg | SUBCUTANEOUS | Status: DC
  Administered 2015-11-02 – 2015-11-13 (×9): 40 mg via SUBCUTANEOUS
  Filled 2015-11-02 (×9): qty 0.4

## 2015-11-02 MED ORDER — OXYCODONE HCL 5 MG PO TABS
10.0000 mg | ORAL_TABLET | ORAL | Status: DC | PRN
  Administered 2015-11-03 – 2015-11-07 (×18): 10 mg via ORAL
  Filled 2015-11-02 (×18): qty 2

## 2015-11-02 NOTE — Progress Notes (Signed)
Daniel [name]  Daily Progress Note   Subjective  -patient reports his pain is no better perhaps even worse is refractory to morphine nurses actually at the bedside when I entered trying to assist the patient with this problem.  Objective Filed Vitals:   11/02/15 1050 11/02/15 1125 11/02/15 1742 11/02/15 1933  BP:  163/98 112/70 121/74  Pulse:  100 83 78  Temp:  97.8 F (36.6 C)  98.6 F (37 C)  TempSrc:  Oral  Oral  Resp:  18  20  Height:      Weight:      SpO2: 94% 94%  94%    Intake/Output Summary (Last 24 hours) at 11/02/15 2114 Last data filed at 11/02/15 1858  Gross per 24 hour  Intake    580 ml  Output   1900 ml  Net  -1320 ml    PULM  Normal effort , no use of accessory muscles CV  No JVD, RRR Abd      No distended, nontender VASC  erythema of the forefoot is about the same as it was yesterday great toe is dusky pedal pulses are nonpalpable edema is 3+ pitting  Laboratory CBC    Component Value Date/Time   WBC 10.2 11/01/2015 0247   WBC 7.8 09/01/2014 0904   HGB 12.1* 11/01/2015 0247   HGB 16.1 09/01/2014 0904   HCT 36.1* 11/01/2015 0247   HCT 48.6 09/01/2014 0904   PLT 205 11/01/2015 0247   PLT 188 09/01/2014 0904    BMET    Component Value Date/Time   NA 137 11/01/2015 0247   NA 137 09/01/2014 0904   K 3.5 11/01/2015 0247   K 3.9 09/01/2014 0904   CL 99* 11/01/2015 0247   CL 103 09/01/2014 0904   CO2 26 11/01/2015 0247   CO2 24 09/01/2014 0904   GLUCOSE 187* 11/01/2015 0247   GLUCOSE 321* 09/01/2014 0904   BUN 41* 11/01/2015 0247   BUN 15 09/01/2014 0904   CREATININE 1.21 11/02/2015 0549   CREATININE 0.96 09/01/2014 0904   CALCIUM 8.0* 11/01/2015 0247   CALCIUM 8.1* 09/01/2014 0904   GFRNONAA >60 11/02/2015 0549   GFRNONAA >60 09/01/2014 0904   GFRNONAA 58* 04/23/2012 1940   GFRAA >60 11/02/2015 0549   GFRAA >60 09/01/2014 0904   GFRAA >60 04/23/2012 1940    Assessment/Planning: 1. Atherosclerotic occlusive  disease bilateral lower extremities with ulceration of the right great toe. I do believe the patient will need angiography with the hope for intervention for limb salvage. At this point I would continue to treat with antibiotics in hopes that his cellulitis improves I also acknowledge he may need for foot [name] based on the evaluation from the foot and ankle service. At this point I will plan for angiography on Friday, December 30.  Given his pain and poor control I will make adjustments in his pain regime increasing his oral and changing his parenteral to Dilaudid while discontinuing the morphine.  Families present this evening and I have spent approximately 30 minutes in discussion with patient and family regarding the possibility of loss of the great toe possibility of surgical drainage and the continued plan for vascular intervention on Friday.  2. Right great toe cellulitis Provide IV antibiotics podiatry on consult  3. Unstable angina/non-STEMI Admit to telemetry and patient is on heparin drip Cycle cardiac biomarkers Nothing by mouth after midnight [facility] and discussed with Dr. Juliann Paresallwood he is aware  Provide nitroglycerin as needed for chest pain Continue aspirin, beta blocker and statin  4. Acute hypoxic respiratory failure secondary to acute exacerbation of CHF/unstable angina Lasix IV every 12 hours Monitor daily weights and intake and output Get echocardiogram Continue aspirin, beta blocker and statin  5. Progressively worsening bilateral lower extremity weakness-. Unlikely stroke CT head is negative Continue aspirin and statin and get MRI of the brain and echocardiogram Neurology consult is placed  6. Diabetes mellitus Sliding scale insulin  7. 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 Shaune Pollack has discussed with on-call ophthalmologist Dr. Duke Salvia, who has recommended outpatient  follow-up after discharge     [name]  11/02/2015, 9:14 PM

## 2015-11-02 NOTE — Progress Notes (Signed)
Initial Nutrition Assessment   INTERVENTION:   Meals and Snacks: Cater to patient preferences Education: pt falling asleep repeatedly on visit today, pt reports he had just received pain meds (RN states the same); left written material at bedside on diabetic diet and will follow-up and provide verbal education   NUTRITION DIAGNOSIS:   Food and nutrition related knowledge deficit related to limited prior education as evidenced by  (consult for diet education).  GOAL:   Patient will meet greater than or equal to 90% of their needs  MONITOR:    (Energy intake, Anthropometrics, Glucose Profile, Electrolyte/Reanl Profile)  REASON FOR ASSESSMENT:   Consult Diet education  ASSESSMENT:    Pt admitted with unstable angina, right toe and foot cellulitis with osteomyelitis and bacteremia, angiography pending for 12/30  Past Medical History  Diagnosis Date  . Diabetes mellitus without complication (HCC)   . Stented coronary artery   . Myocardial infarct (HCC)   . Hypertension   . Coronary artery disease      Diet Order:  Diet heart healthy/carb modified Room service appropriate?: Yes; Fluid consistency:: Thin   Energy Intake: pt eating 100% of meals on average, appetite good  Electrolyte and Renal Profile:  Recent Labs Lab 10/30/15 1424 10/31/15 0540 11/01/15 0247 11/02/15 0549  BUN 18 27* 41*  --   CREATININE 0.91 1.49* 1.34* 1.21  NA 139 138 137  --   K 3.6 3.8 3.5  --    Glucose Profile:  Recent Labs  11/01/15 2120 11/02/15 0759 11/02/15 1127  GLUCAP 298* 160* 374*   Lab Results  Component Value Date   HGBA1C 12.2* 10/31/2015   Meds: ss novolog, levemir, glucophage, lasix  Height:   Ht Readings from Last 1 Encounters:  10/30/15 5\' 8"  (1.727 m)    Weight:   Wt Readings from Last 1 Encounters:  11/02/15 176 lb 12.8 oz (80.196 kg)    Wt Readings from Last 10 Encounters:  11/02/15 176 lb 12.8 oz (80.196 kg)  04/27/15 165 lb (74.844 kg)     BMI:  Body mass index is 26.89 kg/(m^2).  LOW Care Level  Romelle StarcherCate Puneet Masoner MS, [place]RD, LDN [phone] Pager  [phone] Weekend/On-Call Pager

## 2015-11-02 NOTE — Progress Notes (Signed)
ANTIBIOTIC CONSULT NOTE - FOLLOW UP  Pharmacy Consult for vancomycin Indication: cellulitis/possible osteomyelitis  No Known Allergies  Patient Measurements: Height: 5\' 8"  (172.7 cm) Weight: 176 lb 12.8 oz (80.196 kg) IBW/kg (Calculated) : 68.4 Adjusted Body Weight: 71.9 kg  Vital Signs: Temp: 98 F (36.7 C) (12/28 0557) BP: 130/76 mmHg (12/28 0557) Pulse Rate: 80 (12/28 0557)  Recent Labs  10/30/15 1732 10/31/15 0540 11/01/15 0247 11/02/15 0549  WBC 12.0* 10.3 10.2  --   HGB 13.5 12.5* 12.1*  --   PLT 207 184 205  --   CREATININE  --  1.49* 1.34* 1.21   Estimated Creatinine Clearance: 60.5 mL/min (by C-G formula based on Cr of 1.21).  Recent Labs  11/01/15 1701  VANCOTROUGH 18    Assessment: Pharmacy is dosing vancomycin in this 64 year old male being treated for cellulitis with possible osteomyelitis. Patient is currently on day #3 of antibiotics with vancomycin 1 g IV q 12 hours and ampicillin/sulbactam 3 g IV q 8 hours.  Initial dosing kinetics: Vd 50.3 L, Ke 0.075 hr-1, T1/2 9.3 hr  Vancomycin trough obtained at 1700 was therapeutic at 18 mcg/mL. Will aim for higher trough of 15-20 mcg/mL given concern for possible osteomyelitis. This vancomycin trough was obtained prior to the 5th dose and should represent steady state. Patient is not obese, so not at large risk for accumulation (BMI 25.9).  Goal of Therapy:  Vancomycin trough level 15-20 mcg/ml  Plan:  Will continue with current regimen of vancomycin 1000 mg IV q 12 hours. Blood cultures now showing strep growth was consistent with contamination, staph is pan sensitive. Will recommend de- escalation of Abx on rounds.  Pharmacy will follow renal function and determine when to draw another trough if needed.    Cher NakaiSheema Kiyani Jernigan, PharmD Pharmacy Resident 11/02/2015,9:18 AM

## 2015-11-02 NOTE — Care Management (Signed)
PT recommendation SNF.  CSW notified

## 2015-11-02 NOTE — Progress Notes (Signed)
RN reviewed wound care orders, but patient is refusing cleansing/dressing. Will try if patient changes his mind.

## 2015-11-02 NOTE — Progress Notes (Signed)
Pt resting quietly most of the day. Continues to complain of foot pain - PRN pain medication helping. No other complaints. Will continue to monitor.

## 2015-11-02 NOTE — Consult Note (Signed)
ANTIBIOTIC CONSULT NOTE - INITIAL  Pharmacy Consult for unasyn Indication: right toe cellulitis  No Known Allergies  Patient Measurements: Height:  (172.7 cm) Weight: 176 lb 12.8 oz (80.196 kg) IBW/kg (Calculated) : 68.4  Vital Signs: Temp: 98 F (36.7 C) (12/28 0557) BP: 130/76 mmHg (12/28 0557) Pulse Rate: 80 (12/28 0557) Intake/Output from previous day: 12/27 0701 - 12/28 0700 In: 580 [P.O.:480; IV Piggyback:100] Out: 1600 [Urine:1600]  Recent Labs  10/30/15 1732 10/31/15 0540 11/01/15 0247 11/02/15 0549  WBC 12.0* 10.3 10.2  --   HGB 13.5 12.5* 12.1*  --   PLT 207 184 205  --   CREATININE  --  1.49* 1.34* 1.21   Estimated Creatinine Clearance: 60.5 mL/min (by C-G formula based on Cr of 1.21).  Recent Labs  11/01/15 1701  VANCOTROUGH 18     Microbiology: Recent Results (from the past 720 hour(s))  Culture, blood (routine x 2)     Status: None (Preliminary result)   Collection Time: 10/30/15  4:44 PM  Result Value Ref Range Status   Specimen Description BLOOD LEFT WRIST  Final   Special Requests BOTTLES DRAWN AEROBIC AND ANAEROBIC  2CC  Final   Culture  Setup Time   Final    GRAM POSITIVE COCCI IN CLUSTERS AEROBIC BOTTLE ONLY CRITICAL RESULT CALLED TO, READ BACK BY AND VERIFIED WITH: JASON ROBBINS AT 1710 ON 10/31/15 CTJ    Culture STAPHYLOCOCCUS AUREUS AEROBIC BOTTLE ONLY   Final   Report Status PENDING  Incomplete   Organism ID, Bacteria STAPHYLOCOCCUS AUREUS  Final      Susceptibility   Staphylococcus aureus - MIC*    CIPROFLOXACIN <=0.5 SENSITIVE Sensitive     ERYTHROMYCIN 0.5 SENSITIVE Sensitive     GENTAMICIN <=0.5 SENSITIVE Sensitive     OXACILLIN <=0.25 SENSITIVE Sensitive     TRIMETH/SULFA <=10 SENSITIVE Sensitive     CLINDAMYCIN <=0.25 SENSITIVE Sensitive     CEFOXITIN SCREEN NEGATIVE Sensitive     Inducible Clindamycin NEGATIVE Sensitive     TETRACYCLINE Value in next row Sensitive      SENSITIVE<=1    * STAPHYLOCOCCUS AUREUS   Blood Culture ID Panel (Reflexed)     Status: Abnormal   Collection Time: 10/30/15  4:44 PM  Result Value Ref Range Status   Enterococcus species NOT DETECTED NOT DETECTED Final   Listeria monocytogenes NOT DETECTED NOT DETECTED Final   Staphylococcus species DETECTED (A) NOT DETECTED Corrected    Comment: CORRECTED ON 12/27 AT 0801: PREVIOUSLY REPORTED AS NOT DETECTED   Staphylococcus aureus DETECTED (A) NOT DETECTED Final    Comment: CRITICAL RESULT CALLED TO, READ BACK BY AND VERIFIED WITH: JASON ROBBINS AT 1710 ON 10/31/15 CTJ    Streptococcus species NOT DETECTED NOT DETECTED Final   Streptococcus agalactiae NOT DETECTED NOT DETECTED Final   Streptococcus pneumoniae NOT DETECTED NOT DETECTED Final   Streptococcus pyogenes NOT DETECTED NOT DETECTED Final   Acinetobacter baumannii NOT DETECTED NOT DETECTED Final   Enterobacteriaceae species NOT DETECTED NOT DETECTED Final   Enterobacter cloacae complex NOT DETECTED NOT DETECTED Final   Escherichia coli NOT DETECTED NOT DETECTED Final   Klebsiella oxytoca NOT DETECTED NOT DETECTED Final   Klebsiella pneumoniae NOT DETECTED NOT DETECTED Final   Proteus species NOT DETECTED NOT DETECTED Final   Serratia marcescens NOT DETECTED NOT DETECTED Final   Haemophilus influenzae NOT DETECTED NOT DETECTED Final   Neisseria meningitidis NOT DETECTED NOT DETECTED Final   Pseudomonas aeruginosa NOT DETECTED NOT DETECTED  Final   Candida albicans NOT DETECTED NOT DETECTED Final   Candida glabrata NOT DETECTED NOT DETECTED Final   Candida krusei NOT DETECTED NOT DETECTED Final   Candida parapsilosis NOT DETECTED NOT DETECTED Final   Candida tropicalis NOT DETECTED NOT DETECTED Final   Carbapenem resistance NOT DETECTED NOT DETECTED Final   Methicillin resistance NOT DETECTED NOT DETECTED Final   Vancomycin resistance NOT DETECTED NOT DETECTED Final  Culture, blood (routine x 2)     Status: None   Collection Time: 10/30/15  5:31 PM  Result Value  Ref Range Status   Specimen Description BLOOD LEFT HAND  Final   Special Requests BOTTLES DRAWN AEROBIC AND ANAEROBIC  2CC  Final   Culture  Setup Time   Final    GRAM POSITIVE COCCI IN CHAINS IN BOTH AEROBIC AND ANAEROBIC BOTTLES CRITICAL RESULT CALLED TO, READ BACK BY AND VERIFIED WITH: KAREN HAYES AT 1045 10/31/15 CTJ    Culture   Final    VIRIDANS STREPTOCOCCUS IN BOTH AEROBIC AND ANAEROBIC BOTTLES MULTIPLE SPECIES PRESENT Results consistent with contamination.    Report Status 11/02/2015 FINAL  Final  Blood Culture ID Panel (Reflexed)     Status: Abnormal   Collection Time: 10/30/15  5:31 PM  Result Value Ref Range Status   Enterococcus species NOT DETECTED NOT DETECTED Final   Listeria monocytogenes NOT DETECTED NOT DETECTED Final   Staphylococcus species NOT DETECTED NOT DETECTED Final   Staphylococcus aureus NOT DETECTED NOT DETECTED Final   Streptococcus species DETECTED (A) NOT DETECTED Final    Comment: CRITICAL RESULT CALLED TO, READ BACK BY AND VERIFIED WITH: KAREN HAYES AT 1045 ON 10/31/15 CTJ    Streptococcus agalactiae NOT DETECTED NOT DETECTED Final   Streptococcus pneumoniae NOT DETECTED NOT DETECTED Final   Streptococcus pyogenes NOT DETECTED NOT DETECTED Final   Acinetobacter baumannii NOT DETECTED NOT DETECTED Final   Enterobacteriaceae species NOT DETECTED NOT DETECTED Final   Enterobacter cloacae complex NOT DETECTED NOT DETECTED Final   Escherichia coli NOT DETECTED NOT DETECTED Final   Klebsiella oxytoca NOT DETECTED NOT DETECTED Final   Klebsiella pneumoniae NOT DETECTED NOT DETECTED Final   Proteus species NOT DETECTED NOT DETECTED Final   Serratia marcescens NOT DETECTED NOT DETECTED Final   Haemophilus influenzae NOT DETECTED NOT DETECTED Final   Neisseria meningitidis NOT DETECTED NOT DETECTED Final   Pseudomonas aeruginosa NOT DETECTED NOT DETECTED Final   Candida albicans NOT DETECTED NOT DETECTED Final   Candida glabrata NOT DETECTED NOT  DETECTED Final   Candida krusei NOT DETECTED NOT DETECTED Final   Candida parapsilosis NOT DETECTED NOT DETECTED Final   Candida tropicalis NOT DETECTED NOT DETECTED Final   Carbapenem resistance NOT DETECTED NOT DETECTED Final   Methicillin resistance NOT DETECTED NOT DETECTED Final   Vancomycin resistance NOT DETECTED NOT DETECTED Final  Wound culture     Status: None (Preliminary result)   Collection Time: 10/31/15  2:12 PM  Result Value Ref Range Status   Specimen Description ULCER  Final   Special Requests NONE  Final   Gram Stain   Final    RARE WBC SEEN MODERATE GRAM POSITIVE COCCI IN PAIRS IN CLUSTERS RARE GRAM NEGATIVE RODS RARE GRAM POSITIVE RODS    Culture   Final    MODERATE GROWTH GRAM NEGATIVE RODS IDENTIFICATION AND SUSCEPTIBILITIES TO FOLLOW    Report Status PENDING  Incomplete    Medical History: Past Medical History  Diagnosis Date  . Diabetes mellitus without  complication (HCC)   . Stented coronary artery   . Myocardial infarct (HCC)   . Hypertension   . Coronary artery disease     Medications:  Scheduled:  . ampicillin-sulbactam (UNASYN) IV  3 g Intravenous Q6H  . aspirin EC  81 mg Oral Daily  . atorvastatin  40 mg Oral QHS  . enoxaparin (LOVENOX) injection  40 mg Subcutaneous Q24H  . furosemide  40 mg Intravenous Q12H  . glipiZIDE  10 mg Oral BID AC  . insulin aspart  0-9 Units Subcutaneous TID AC & HS  . losartan  100 mg Oral Daily  . metoprolol tartrate  25 mg Oral BID  . pantoprazole  40 mg Oral QAC breakfast  . pregabalin  75 mg Oral BID  . 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 Pt is currently on vancomycin for bacteremia. Pharmacy consulted to dose unasyn due to wound cultures showing GNR and GPC.  Goal of Therapy:  resolution of infection  Plan:  Will continue Unasyn 3g IV q6 hours. Will continue to follow up on culture results and  monitor renal function. Dose adjustments will be made as needed.  Will recommended antibiotic regimen be narrowed on rounds based on updated blood culture results showing pansensitive staph.   Keaden Gunnoe M Charli Liberatore 11/02/2015,9:23 AM

## 2015-11-02 NOTE — Progress Notes (Signed)
[facility] INFECTIOUS DISEASE PROGRESS NOTE Date of Admission:  10/30/2015     ID: Daniel [name] is a 64 y.o. male with MSSA bacteremia, viridan strep bacteremia (possible contaminant), wound infection.    Active Problems:   Unstable angina (HCC)  Subjective: Still foot pain, no fevers, sleepy  ROS  Eleven systems are reviewed and negative except per hpi  Medications:  Antibiotics Given (last 72 hours)    Date/Time Action Medication Dose Rate   10/31/15 0555 Given   vancomycin (VANCOCIN) IVPB 1000 mg/200 mL premix 1,000 mg 200 mL/hr   10/31/15 1210 Given   Ampicillin-Sulbactam (UNASYN) 3 g in sodium chloride 0.9 % 100 mL IVPB 3 g 100 mL/hr   10/31/15 1707 Given   vancomycin (VANCOCIN) IVPB 1000 mg/200 mL premix 1,000 mg 200 mL/hr   10/31/15 1806 Given   Ampicillin-Sulbactam (UNASYN) 3 g in sodium chloride 0.9 % 100 mL IVPB 3 g 100 mL/hr   10/31/15 2311 Given   Ampicillin-Sulbactam (UNASYN) 3 g in sodium chloride 0.9 % 100 mL IVPB 3 g 100 mL/hr   11/01/15 0524 Given   vancomycin (VANCOCIN) IVPB 1000 mg/200 mL premix 1,000 mg 200 mL/hr   11/01/15 0626 Given   Ampicillin-Sulbactam (UNASYN) 3 g in sodium chloride 0.9 % 100 mL IVPB 3 g 100 mL/hr   11/01/15 1039 Given   Ampicillin-Sulbactam (UNASYN) 3 g in sodium chloride 0.9 % 100 mL IVPB 3 g 100 mL/hr   11/01/15 1739 Given   Ampicillin-Sulbactam (UNASYN) 3 g in sodium chloride 0.9 % 100 mL IVPB 3 g 100 mL/hr   11/01/15 1756 Given   vancomycin (VANCOCIN) IVPB 1000 mg/200 mL premix 1,000 mg 200 mL/hr   11/01/15 2242 Given   Ampicillin-Sulbactam (UNASYN) 3 g in sodium chloride 0.9 % 100 mL IVPB 3 g 100 mL/hr   11/02/15 0425 Given   vancomycin (VANCOCIN) IVPB 1000 mg/200 mL premix 1,000 mg 200 mL/hr   11/02/15 0543 Given   Ampicillin-Sulbactam (UNASYN) 3 g in sodium chloride 0.9 % 100 mL IVPB 3 g 100 mL/hr   11/02/15 1215 Given   Ampicillin-Sulbactam (UNASYN) 3 g in sodium chloride 0.9 % 100 mL IVPB 3 g 100 mL/hr     .  aspirin EC  81 mg Oral Daily  . atorvastatin  40 mg Oral QHS  . enoxaparin (LOVENOX) injection  40 mg Subcutaneous Q24H  . furosemide  40 mg Oral BID  . glipiZIDE  10 mg Oral BID AC  . insulin aspart  0-9 Units Subcutaneous TID AC & HS  . insulin detemir  10 Units Subcutaneous QHS  . losartan  100 mg Oral Daily  . metFORMIN  1,000 mg Oral BID WC  . metoprolol tartrate  25 mg Oral BID  . pantoprazole  40 mg Oral QAC breakfast  . piperacillin-tazobactam (ZOSYN)  IV  3.375 g Intravenous 3 times per day  . pregabalin  75 mg Oral BID    Objective: Vital signs in last 24 hours: Temp:  [97.8 F (36.6 C)-98 F (36.7 C)] 97.8 F (36.6 C) (12/28 1125) Pulse Rate:  [66-100] 100 (12/28 1125) Resp:  [18-20] 18 (12/28 1125) BP: (100-163)/(62-98) 163/98 mmHg (12/28 1125) SpO2:  [94 %-98 %] 94 % (12/28 1125) Weight:  [80.196 kg (176 lb 12.8 oz)] 80.196 kg (176 lb 12.8 oz) (12/28 0557) Constitutional: He is oriented to person, place, and time. dishelved HENT: PERRLA, EOMI, anicteric Mouth/Throat: Oropharynx is clear and moist. No oropharyngeal exudate.  Cardiovascular: Normal rate, regular rhythm   and normal heart sounds.  Pulmonary/Chest: Effort normal and breath sounds normal. No respiratory distress. He has no wheezes.  Abdominal: Soft. Bowel sounds are normal. He exhibits no distension. There is no tenderness.  Lymphadenopathy: He has no cervical adenopathy.  Neurological: He is alert and oriented to person, place, and time.  Skin: R great toe with dark eschar with surrounding redness, has edema and erythema over dorsum of foot to mid foot.  Psychiatric: He has a normal mood and affect. His behavior is normal.   Lab Results  Recent Labs  10/31/15 0540 11/01/15 0247 11/02/15 0549  WBC 10.3 10.2  --   HGB 12.5* 12.1*  --   HCT 36.8* 36.1*  --   NA 138 137  --   K 3.8 3.5  --   CL 101 99*  --   CO2 28 26  --   BUN 27* 41*  --   CREATININE 1.49* 1.34* 1.21     Microbiology: Results for orders placed or performed during the hospital encounter of 10/30/15  Culture, blood (routine x 2)     Status: None (Preliminary result)   Collection Time: 10/30/15  4:44 PM  Result Value Ref Range Status   Specimen Description BLOOD LEFT WRIST  Final   Special Requests BOTTLES DRAWN AEROBIC AND ANAEROBIC  2CC  Final   Culture  Setup Time   Final    GRAM POSITIVE COCCI IN CLUSTERS AEROBIC BOTTLE ONLY CRITICAL RESULT CALLED TO, READ BACK BY AND VERIFIED WITH: JASON ROBBINS AT 5638 ON 10/31/15 CTJ    Culture STAPHYLOCOCCUS AUREUS AEROBIC BOTTLE ONLY   Final   Report Status PENDING  Incomplete   Organism ID, Bacteria STAPHYLOCOCCUS AUREUS  Final      Susceptibility   Staphylococcus aureus - MIC*    CIPROFLOXACIN <=0.5 SENSITIVE Sensitive     ERYTHROMYCIN 0.5 SENSITIVE Sensitive     GENTAMICIN <=0.5 SENSITIVE Sensitive     OXACILLIN <=0.25 SENSITIVE Sensitive     TRIMETH/SULFA <=10 SENSITIVE Sensitive     CLINDAMYCIN <=0.25 SENSITIVE Sensitive     CEFOXITIN SCREEN NEGATIVE Sensitive     Inducible Clindamycin NEGATIVE Sensitive     TETRACYCLINE Value in next row Sensitive      SENSITIVE<=1    * STAPHYLOCOCCUS AUREUS  Blood Culture ID Panel (Reflexed)     Status: Abnormal   Collection Time: 10/30/15  4:44 PM  Result Value Ref Range Status   Enterococcus species NOT DETECTED NOT DETECTED Final   Listeria monocytogenes NOT DETECTED NOT DETECTED Final   Staphylococcus species DETECTED (A) NOT DETECTED Corrected    Comment: CORRECTED ON 12/27 AT 0801: PREVIOUSLY REPORTED AS NOT DETECTED   Staphylococcus aureus DETECTED (A) NOT DETECTED Final    Comment: CRITICAL RESULT CALLED TO, READ BACK BY AND VERIFIED WITH: JASON ROBBINS AT 1710 ON 10/31/15 CTJ    Streptococcus species NOT DETECTED NOT DETECTED Final   Streptococcus agalactiae NOT DETECTED NOT DETECTED Final   Streptococcus pneumoniae NOT DETECTED NOT DETECTED Final   Streptococcus pyogenes NOT  DETECTED NOT DETECTED Final   Acinetobacter baumannii NOT DETECTED NOT DETECTED Final   Enterobacteriaceae species NOT DETECTED NOT DETECTED Final   Enterobacter cloacae complex NOT DETECTED NOT DETECTED Final   Escherichia coli NOT DETECTED NOT DETECTED Final   Klebsiella oxytoca NOT DETECTED NOT DETECTED Final   Klebsiella pneumoniae NOT DETECTED NOT DETECTED Final   Proteus species NOT DETECTED NOT DETECTED Final   Serratia marcescens NOT DETECTED NOT DETECTED Final  Haemophilus influenzae NOT DETECTED NOT DETECTED Final   Neisseria meningitidis NOT DETECTED NOT DETECTED Final   Pseudomonas aeruginosa NOT DETECTED NOT DETECTED Final   Candida albicans NOT DETECTED NOT DETECTED Final   Candida glabrata NOT DETECTED NOT DETECTED Final   Candida krusei NOT DETECTED NOT DETECTED Final   Candida parapsilosis NOT DETECTED NOT DETECTED Final   Candida tropicalis NOT DETECTED NOT DETECTED Final   Carbapenem resistance NOT DETECTED NOT DETECTED Final   Methicillin resistance NOT DETECTED NOT DETECTED Final   Vancomycin resistance NOT DETECTED NOT DETECTED Final  Culture, blood (routine x 2)     Status: None   Collection Time: 10/30/15  5:31 PM  Result Value Ref Range Status   Specimen Description BLOOD LEFT HAND  Final   Special Requests BOTTLES DRAWN AEROBIC AND ANAEROBIC  2CC  Final   Culture  Setup Time   Final    GRAM POSITIVE COCCI IN CHAINS IN BOTH AEROBIC AND ANAEROBIC BOTTLES CRITICAL RESULT CALLED TO, READ BACK BY AND VERIFIED WITH: KAREN HAYES AT 1045 10/31/15 CTJ    Culture   Final    VIRIDANS STREPTOCOCCUS IN BOTH AEROBIC AND ANAEROBIC BOTTLES MULTIPLE SPECIES PRESENT Results consistent with contamination.    Report Status 11/02/2015 FINAL  Final  Blood Culture ID Panel (Reflexed)     Status: Abnormal   Collection Time: 10/30/15  5:31 PM  Result Value Ref Range Status   Enterococcus species NOT DETECTED NOT DETECTED Final   Listeria monocytogenes NOT DETECTED NOT  DETECTED Final   Staphylococcus species NOT DETECTED NOT DETECTED Final   Staphylococcus aureus NOT DETECTED NOT DETECTED Final   Streptococcus species DETECTED (A) NOT DETECTED Final    Comment: CRITICAL RESULT CALLED TO, READ BACK BY AND VERIFIED WITH: KAREN HAYES AT 1045 ON 10/31/15 CTJ    Streptococcus agalactiae NOT DETECTED NOT DETECTED Final   Streptococcus pneumoniae NOT DETECTED NOT DETECTED Final   Streptococcus pyogenes NOT DETECTED NOT DETECTED Final   Acinetobacter baumannii NOT DETECTED NOT DETECTED Final   Enterobacteriaceae species NOT DETECTED NOT DETECTED Final   Enterobacter cloacae complex NOT DETECTED NOT DETECTED Final   Escherichia coli NOT DETECTED NOT DETECTED Final   Klebsiella oxytoca NOT DETECTED NOT DETECTED Final   Klebsiella pneumoniae NOT DETECTED NOT DETECTED Final   Proteus species NOT DETECTED NOT DETECTED Final   Serratia marcescens NOT DETECTED NOT DETECTED Final   Haemophilus influenzae NOT DETECTED NOT DETECTED Final   Neisseria meningitidis NOT DETECTED NOT DETECTED Final   Pseudomonas aeruginosa NOT DETECTED NOT DETECTED Final   Candida albicans NOT DETECTED NOT DETECTED Final   Candida glabrata NOT DETECTED NOT DETECTED Final   Candida krusei NOT DETECTED NOT DETECTED Final   Candida parapsilosis NOT DETECTED NOT DETECTED Final   Candida tropicalis NOT DETECTED NOT DETECTED Final   Carbapenem resistance NOT DETECTED NOT DETECTED Final   Methicillin resistance NOT DETECTED NOT DETECTED Final   Vancomycin resistance NOT DETECTED NOT DETECTED Final  CULTURE, BLOOD (ROUTINE X 2) w Reflex to PCR ID Panel     Status: None (Preliminary result)   Collection Time: 10/31/15 11:35 AM  Result Value Ref Range Status   Specimen Description BLOOD RIGHT HAND  Final   Special Requests BOTTLES DRAWN AEROBIC AND ANAEROBIC 2CC  Final   Culture NO GROWTH 2 DAYS  Final   Report Status PENDING  Incomplete  CULTURE, BLOOD (ROUTINE X 2) w Reflex to PCR ID Panel      Status: None (  Preliminary result)   Collection Time: 10/31/15 11:35 AM  Result Value Ref Range Status   Specimen Description BLOOD RIGHT ASSIST CONTROL  Final   Special Requests BOTTLES DRAWN AEROBIC AND ANAEROBIC 2CC  Final   Culture NO GROWTH 2 DAYS  Final   Report Status PENDING  Incomplete  Wound culture     Status: None (Preliminary result)   Collection Time: 10/31/15  2:12 PM  Result Value Ref Range Status   Specimen Description ULCER  Final   Special Requests NONE  Final   Gram Stain   Final    RARE WBC SEEN MODERATE GRAM POSITIVE COCCI IN PAIRS IN CLUSTERS RARE GRAM NEGATIVE RODS RARE GRAM POSITIVE RODS    Culture   Final    MODERATE GROWTH PROVIDENCIA RETTGERI HEAVY GROWTH STAPHYLOCOCCUS AUREUS SUSCEPTIBILITIES TO FOLLOW    Report Status PENDING  Incomplete   Organism ID, Bacteria PROVIDENCIA RETTGERI  Final      Susceptibility   Providencia rettgeri - MIC*    AMPICILLIN 4 RESISTANT Resistant     CEFTAZIDIME <=1 SENSITIVE Sensitive     CEFAZOLIN >=64 RESISTANT Resistant     CEFTRIAXONE <=1 SENSITIVE Sensitive     CIPROFLOXACIN <=0.25 SENSITIVE Sensitive     GENTAMICIN 2 SENSITIVE Sensitive     IMIPENEM 1 SENSITIVE Sensitive     TRIMETH/SULFA >=320 RESISTANT Resistant     PIP/TAZO Value in next row Sensitive      SENSITIVE<=4    * MODERATE GROWTH PROVIDENCIA RETTGERI  Culture, blood (Routine X 2) w Reflex to ID Panel     Status: None (Preliminary result)   Collection Time: 11/01/15  5:07 PM  Result Value Ref Range Status   Specimen Description BLOOD RIGHT ASSIST CONTROL  Final   Special Requests BOTTLES DRAWN AEROBIC AND ANAEROBIC 5CC  Final   Culture NO GROWTH < 24 HOURS  Final   Report Status PENDING  Incomplete  Culture, blood (Routine X 2) w Reflex to ID Panel     Status: None (Preliminary result)   Collection Time: 11/01/15  5:07 PM  Result Value Ref Range Status   Specimen Description BLOOD RIGHT HAND  Final   Special Requests BOTTLES DRAWN AEROBIC AND  ANAEROBIC 3CC  Final   Culture NO GROWTH < 24 HOURS  Final   Report Status PENDING  Incomplete    Studies/Results: Mr Brain W Wo Contrast  11/01/2015  CLINICAL DATA:  64-year-old male with progressive lower extremity weakness for 6 months. Initial encounter. Diabetes EXAM: MRI HEAD WITHOUT AND WITH CONTRAST TECHNIQUE: Multiplanar, multiecho pulse sequences of the brain and surrounding structures were obtained without and with intravenous contrast. CONTRAST:  16mL MULTIHANCE GADOBENATE DIMEGLUMINE 529 MG/ML IV SOLN COMPARISON:  Head CT without contrast 10/30/2015. [facility] postoperative cervical spine radiograph 07/19/2011. FINDINGS: Major intracranial vascular flow voids are preserved. Late subacute to early chronic appearing right corona radiata 10 mm focus of diffusion signal change (series 100, image 36) with T2 shine through. No restricted diffusion or evidence of acute infarction. Additional patchy bilateral cerebral white matter T2 and FLAIR hyperintensity. Chronic lacunar infarct in the *SCRATCH* SPECT chronic lacunar infarcts in the right thalamus. Mild for age patchy T2 hyperintensity in the pons, but with associated pontine chronic micro hemorrhage (series 9, image 8). No cortical encephalomalacia. No midline shift, mass effect, evidence of mass lesion, ventriculomegaly, extra-axial collection or acute intracranial hemorrhage. Cervicomedullary junction and pituitary are within normal limits. Negative visualized cervical spine aside from hardware   susceptibility artifact. No abnormal enhancement identified. Incidental anterior right frontal lobe developmental venous anomaly (normal anatomic variant). Visible internal auditory structures appear normal. Mastoids and paranasal sinuses are clear. Postoperative changes to the globes. Otherwise negative scalp and orbits soft tissues. Normal bone marrow signal. IMPRESSION: No acute intracranial abnormality. Moderate for age  chronic small vessel disease with late subacute lacune in the right corona radiata. Electronically Signed   By: H  Hall M.D.   On: 11/01/2015 10:19   Mr Foot Right Wo Contrast  11/01/2015  CLINICAL DATA:  Ulceration in a about 7 mm along the medial right great toe with purulence noted. Erythema extends dorsally in the foot. EXAM: MRI OF THE RIGHT FOREFOOT WITHOUT CONTRAST TECHNIQUE: Multiplanar, multisequence MR imaging was performed. No intravenous contrast was administered. COMPARISON:  10/30/2015 FINDINGS: There is subcutaneous edema diffusely in the foot especially dorsally, and extending into the ankle both medially and laterally. Low-level edema extends into the toes. On image 32 series 4 there is a defect in the skin and subcutaneous tissues medially along the great toe approximately at the level of the distal phalanx. The on images 23-25 of series 7 there seems to be abnormal increased inversion recovery weighted signal in the base of the distal phalanx suspicious for early osteomyelitis given the immediately adjacent ulceration. Sharply defined erosions or cystic lesions are present along the Lisfranc joint is, including adjacent to the expected attachment site of the Lisfranc ligament to the second metatarsal base. However, there is no overt malalignment at the Lisfranc joint. Erosions are present at the articulation of the navicular and medial cuneiform. There is thickening of the medial band of the plantar fascia proximally. IMPRESSION: 1. Ulceration medially along the great toe, with underlying edema signal in the marrow of the base of the distal phalanx suspicious for early osteomyelitis. No other osteomyelitis identified. 2. Cystic or erosive lesions along significant portions of the Lisfranc joint and at the articulation of the distal navicular with the medial cuneiform, suspicious for erosive arthropathy such as rheumatoid arthropathy. An alternative would be early Charcot joint. 3. Edema  tracking throughout the foot and ankle, especially dorsally, potentially from cellulitis. Electronically Signed   By: Walter  Liebkemann M.D.   On: 11/01/2015 10:37   ECHO  Study Conclusions  - Left ventricle: The cavity size was severely dilated. Systolic function was severely reduced. The estimated ejection fraction was in the range of 20% to 25%. Diffuse hypokinesis. - Aortic valve: Valve area (Vmax): 2.05 cm^2. - Mitral valve: There was mild regurgitation. - Left atrium: The atrium was mildly dilated. - Right atrium: The atrium was mildly dilated.  Assessment/Plan:  Daniel [name] is a 64 y.o. 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 Wound cx with heavy growth staph aureus and mod growth [place]. A1c 12.2. Vascular eval pending. Denies smoking.  ESR 18, CRP pending. HIV NR.  FU BCX 12/27 NGTD Echo with EF 20-25%, no vegetations noted  Recommendations Dc vanco Cont  unasyn (I confirmed with micro that the providencia is sensitive) Will need at least 2 weeks IV abx for the S aureus bacteremia but I suspect will need 6 week total IV abx for the osteomyelitis. If bcx from 12/27 are negative can place picc 12/29. He seems to be unable to care for himself and now has some decreased vision- may benefit from SNF for IV abx  Thank   you very much for the consult. Will follow with you.  [place], [place]   11/02/2015, 2:28 PM

## 2015-11-02 NOTE — Progress Notes (Signed)
ANTICOAGULATION CONSULT NOTE - Initial Consult  Pharmacy Consult for heparin drip Indication: chest pain/ACS  No Known Allergies  Patient Measurements: Height:  (172.7 cm) Weight: 177 lb (80.287 kg) IBW/kg (Calculated) : 68.4 Heparin Dosing Weight: 77.1 kg  Vital Signs: Temp: 98 F (36.7 C) (12/27 2007) BP: 129/86 mmHg (12/27 2133) Pulse Rate: 79 (12/27 2133)  Recent Labs  10/30/15 1331  10/30/15 1424  10/30/15 1732  10/30/15 2253 10/31/15 0540 10/31/15 1135  11/01/15 0247 11/01/15 1059 11/01/15 1855  HGB  --   --   --   < > 13.5  --   --  12.5*  --   --  12.1*  --   --   HCT  --   --   --   --  40.3  --   --  36.8*  --   --  36.1*  --   --   PLT  --   --   --   --  207  --   --  184  --   --  205  --   --   APTT 32  --   --   --   --   --   --   --   --   --   --   --   --   LABPROT 14.4  --   --   --   --   --   --  15.7*  --   --   --   --   --   INR 1.10  --   --   --   --   --   --  1.23  --   --   --   --   --   HEPARINUNFRC  --   --   --   --   --   < > 0.10*  --  0.12*  < > 0.28* <0.10* <0.10*  CREATININE  --   --  0.91  --   --   --   --  1.49*  --   --  1.34*  --   --   TROPONINI  --   < > 0.33*  --   --   --  0.66* 0.82* 0.71*  --   --   --   --   < > = values in this interval not displayed.  Estimated Creatinine Clearance: 54.6 mL/min (by C-G formula based on Cr of 1.34).   Medical History: Past Medical History  Diagnosis Date  . Diabetes mellitus without complication (HCC)   . Stented coronary artery   . Myocardial infarct (HCC)   . Hypertension   . Coronary artery disease    Assessment: Pharmacy consulted to dose a heparin drip in this 64 year old male for chest pain/ACS. Patient was not taking anticoagulants prior to admission per medication rec. Baseline CBC, protime/INR, and APTT were obtained in ED and are pending.   Heparin dosing weight = 77.1 kg  Goal of Therapy:  Heparin level 0.3-0.7 units/ml Monitor platelets by anticoagulation  protocol: Yes   Plan:  Heparin level subtherapeutic. 2300 Units IV x 1 bolus and increase rate to 1450 units/hr. Will recheck level in 6 hours.  12/26:  HL @ 20:30 = 0.31 Will draw confirmation level in 6 hrs on 12/27 @ 2:30.   12/27 02:30 heparin level 0.28. 1200 unit bolus and increase rate to 1600 units/hr. Recheck in 6 hours.  12/27  1059: heparin level <0.10 Nurse confirmed heparin drip was off while patient went for MRI this morning. Will redraw heparin level in 6 hours.    12/27 1900 heparin level <0.1. 2300 unit bolus and increase to 1850 units/hr. Recheck in 6 hours   Cher NakaiSheema Hallaji, PharmD Pharmacy Resident 11/02/2015,3:06 AM

## 2015-11-02 NOTE — Consult Note (Signed)
ANTIBIOTIC CONSULT NOTE - INITIAL  Pharmacy Consult for Unasyn  Indication: MSSA Bacteremia and Providencia in Wound   No Known Allergies  Patient Measurements: Height:  (172.7 cm) Weight: 176 lb 12.8 oz (80.196 kg) IBW/kg (Calculated) : 68.4  Vital Signs: Temp: 97.8 F (36.6 C) (12/28 1125) Temp Source: Oral (12/28 1125) BP: 163/98 mmHg (12/28 1125) Pulse Rate: 100 (12/28 1125) Intake/Output from previous day: 12/27 0701 - 12/28 0700 In: 580 [P.O.:480; IV Piggyback:100] Out: 1600 [Urine:1600] Intake/Output from this shift: Total I/O In: 480 [P.O.:480] Out: 900 [Urine:900]  Recent Labs  10/30/15 1732 10/31/15 0540 11/01/15 0247 11/02/15 0549  WBC 12.0* 10.3 10.2  --   HGB 13.5 12.5* 12.1*  --   PLT 207 184 205  --   CREATININE  --  1.49* 1.34* 1.21   Estimated Creatinine Clearance: 60.5 mL/min (by C-G formula based on Cr of 1.21).  Recent Labs  11/01/15 1701  VANCOTROUGH 18    Microbiology: Recent Results (from the past 720 hour(s))  Culture, blood (routine x 2)     Status: None (Preliminary result)   Collection Time: 10/30/15  4:44 PM  Result Value Ref Range Status   Specimen Description BLOOD LEFT WRIST  Final   Special Requests BOTTLES DRAWN AEROBIC AND ANAEROBIC  2CC  Final   Culture  Setup Time   Final    GRAM POSITIVE COCCI IN CLUSTERS AEROBIC BOTTLE ONLY CRITICAL RESULT CALLED TO, READ BACK BY AND VERIFIED WITH: JASON ROBBINS AT 1710 ON 10/31/15 CTJ    Culture STAPHYLOCOCCUS AUREUS AEROBIC BOTTLE ONLY   Final   Report Status PENDING  Incomplete   Organism ID, Bacteria STAPHYLOCOCCUS AUREUS  Final      Susceptibility   Staphylococcus aureus - MIC*    CIPROFLOXACIN <=0.5 SENSITIVE Sensitive     ERYTHROMYCIN 0.5 SENSITIVE Sensitive     GENTAMICIN <=0.5 SENSITIVE Sensitive     OXACILLIN <=0.25 SENSITIVE Sensitive     TRIMETH/SULFA <=10 SENSITIVE Sensitive     CLINDAMYCIN <=0.25 SENSITIVE Sensitive     CEFOXITIN SCREEN NEGATIVE Sensitive      Inducible Clindamycin NEGATIVE Sensitive     TETRACYCLINE Value in next row Sensitive      SENSITIVE<=1    * STAPHYLOCOCCUS AUREUS  Blood Culture ID Panel (Reflexed)     Status: Abnormal   Collection Time: 10/30/15  4:44 PM  Result Value Ref Range Status   Enterococcus species NOT DETECTED NOT DETECTED Final   Listeria monocytogenes NOT DETECTED NOT DETECTED Final   Staphylococcus species DETECTED (A) NOT DETECTED Corrected    Comment: CORRECTED ON 12/27 AT 0801: PREVIOUSLY REPORTED AS NOT DETECTED   Staphylococcus aureus DETECTED (A) NOT DETECTED Final    Comment: CRITICAL RESULT CALLED TO, READ BACK BY AND VERIFIED WITH: JASON ROBBINS AT 1710 ON 10/31/15 CTJ    Streptococcus species NOT DETECTED NOT DETECTED Final   Streptococcus agalactiae NOT DETECTED NOT DETECTED Final   Streptococcus pneumoniae NOT DETECTED NOT DETECTED Final   Streptococcus pyogenes NOT DETECTED NOT DETECTED Final   Acinetobacter baumannii NOT DETECTED NOT DETECTED Final   Enterobacteriaceae species NOT DETECTED NOT DETECTED Final   Enterobacter cloacae complex NOT DETECTED NOT DETECTED Final   Escherichia coli NOT DETECTED NOT DETECTED Final   Klebsiella oxytoca NOT DETECTED NOT DETECTED Final   Klebsiella pneumoniae NOT DETECTED NOT DETECTED Final   Proteus species NOT DETECTED NOT DETECTED Final   Serratia marcescens NOT DETECTED NOT DETECTED Final   Haemophilus influenzae NOT  DETECTED NOT DETECTED Final   Neisseria meningitidis NOT DETECTED NOT DETECTED Final   Pseudomonas aeruginosa NOT DETECTED NOT DETECTED Final   Candida albicans NOT DETECTED NOT DETECTED Final   Candida glabrata NOT DETECTED NOT DETECTED Final   Candida krusei NOT DETECTED NOT DETECTED Final   Candida parapsilosis NOT DETECTED NOT DETECTED Final   Candida tropicalis NOT DETECTED NOT DETECTED Final   Carbapenem resistance NOT DETECTED NOT DETECTED Final   Methicillin resistance NOT DETECTED NOT DETECTED Final   Vancomycin  resistance NOT DETECTED NOT DETECTED Final  Culture, blood (routine x 2)     Status: None   Collection Time: 10/30/15  5:31 PM  Result Value Ref Range Status   Specimen Description BLOOD LEFT HAND  Final   Special Requests BOTTLES DRAWN AEROBIC AND ANAEROBIC  2CC  Final   Culture  Setup Time   Final    GRAM POSITIVE COCCI IN CHAINS IN BOTH AEROBIC AND ANAEROBIC BOTTLES CRITICAL RESULT CALLED TO, READ BACK BY AND VERIFIED WITH: KAREN HAYES AT 1045 10/31/15 CTJ    Culture   Final    VIRIDANS STREPTOCOCCUS IN BOTH AEROBIC AND ANAEROBIC BOTTLES MULTIPLE SPECIES PRESENT Results consistent with contamination.    Report Status 11/02/2015 FINAL  Final  Blood Culture ID Panel (Reflexed)     Status: Abnormal   Collection Time: 10/30/15  5:31 PM  Result Value Ref Range Status   Enterococcus species NOT DETECTED NOT DETECTED Final   Listeria monocytogenes NOT DETECTED NOT DETECTED Final   Staphylococcus species NOT DETECTED NOT DETECTED Final   Staphylococcus aureus NOT DETECTED NOT DETECTED Final   Streptococcus species DETECTED (A) NOT DETECTED Final    Comment: CRITICAL RESULT CALLED TO, READ BACK BY AND VERIFIED WITH: KAREN HAYES AT 1045 ON 10/31/15 CTJ    Streptococcus agalactiae NOT DETECTED NOT DETECTED Final   Streptococcus pneumoniae NOT DETECTED NOT DETECTED Final   Streptococcus pyogenes NOT DETECTED NOT DETECTED Final   Acinetobacter baumannii NOT DETECTED NOT DETECTED Final   Enterobacteriaceae species NOT DETECTED NOT DETECTED Final   Enterobacter cloacae complex NOT DETECTED NOT DETECTED Final   Escherichia coli NOT DETECTED NOT DETECTED Final   Klebsiella oxytoca NOT DETECTED NOT DETECTED Final   Klebsiella pneumoniae NOT DETECTED NOT DETECTED Final   Proteus species NOT DETECTED NOT DETECTED Final   Serratia marcescens NOT DETECTED NOT DETECTED Final   Haemophilus influenzae NOT DETECTED NOT DETECTED Final   Neisseria meningitidis NOT DETECTED NOT DETECTED Final    Pseudomonas aeruginosa NOT DETECTED NOT DETECTED Final   Candida albicans NOT DETECTED NOT DETECTED Final   Candida glabrata NOT DETECTED NOT DETECTED Final   Candida krusei NOT DETECTED NOT DETECTED Final   Candida parapsilosis NOT DETECTED NOT DETECTED Final   Candida tropicalis NOT DETECTED NOT DETECTED Final   Carbapenem resistance NOT DETECTED NOT DETECTED Final   Methicillin resistance NOT DETECTED NOT DETECTED Final   Vancomycin resistance NOT DETECTED NOT DETECTED Final  CULTURE, BLOOD (ROUTINE X 2) w Reflex to PCR ID Panel     Status: None (Preliminary result)   Collection Time: 10/31/15 11:35 AM  Result Value Ref Range Status   Specimen Description BLOOD RIGHT HAND  Final   Special Requests BOTTLES DRAWN AEROBIC AND ANAEROBIC 2CC  Final   Culture NO GROWTH 2 DAYS  Final   Report Status PENDING  Incomplete  CULTURE, BLOOD (ROUTINE X 2) w Reflex to PCR ID Panel     Status: None (Preliminary result)  Collection Time: 10/31/15 11:35 AM  Result Value Ref Range Status   Specimen Description BLOOD RIGHT ASSIST CONTROL  Final   Special Requests BOTTLES DRAWN AEROBIC AND ANAEROBIC 2CC  Final   Culture NO GROWTH 2 DAYS  Final   Report Status PENDING  Incomplete  Wound culture     Status: None (Preliminary result)   Collection Time: 10/31/15  2:12 PM  Result Value Ref Range Status   Specimen Description ULCER  Final   Special Requests NONE  Final   Gram Stain   Final    RARE WBC SEEN MODERATE GRAM POSITIVE COCCI IN PAIRS IN CLUSTERS RARE GRAM NEGATIVE RODS RARE GRAM POSITIVE RODS    Culture   Final    MODERATE GROWTH PROVIDENCIA RETTGERI HEAVY GROWTH STAPHYLOCOCCUS AUREUS SUSCEPTIBILITIES TO FOLLOW    Report Status PENDING  Incomplete   Organism ID, Bacteria PROVIDENCIA RETTGERI  Final      Susceptibility   Providencia rettgeri - MIC*    AMPICILLIN 4 RESISTANT Resistant     CEFTAZIDIME <=1 SENSITIVE Sensitive     CEFAZOLIN >=64 RESISTANT Resistant     CEFTRIAXONE <=1  SENSITIVE Sensitive     CIPROFLOXACIN <=0.25 SENSITIVE Sensitive     GENTAMICIN 2 SENSITIVE Sensitive     IMIPENEM 1 SENSITIVE Sensitive     TRIMETH/SULFA >=320 RESISTANT Resistant     PIP/TAZO Value in next row Sensitive      SENSITIVE<=4    AMPICILLIN/SULBACTAM Value in next row Sensitive      SENSITIVE<=2    * MODERATE GROWTH PROVIDENCIA RETTGERI  Culture, blood (Routine X 2) w Reflex to ID Panel     Status: None (Preliminary result)   Collection Time: 11/01/15  5:07 PM  Result Value Ref Range Status   Specimen Description BLOOD RIGHT ASSIST CONTROL  Final   Special Requests BOTTLES DRAWN AEROBIC AND ANAEROBIC 5CC  Final   Culture NO GROWTH < 24 HOURS  Final   Report Status PENDING  Incomplete  Culture, blood (Routine X 2) w Reflex to ID Panel     Status: None (Preliminary result)   Collection Time: 11/01/15  5:07 PM  Result Value Ref Range Status   Specimen Description BLOOD RIGHT HAND  Final   Special Requests BOTTLES DRAWN AEROBIC AND ANAEROBIC 3CC  Final   Culture NO GROWTH < 24 HOURS  Final   Report Status PENDING  Incomplete    Medical History: Past Medical History  Diagnosis Date  . Diabetes mellitus without complication (HCC)   . Stented coronary artery   . Myocardial infarct (HCC)   . Hypertension   . Coronary artery disease    Assessment: Pt is a 64 year old male with cellulitis/osetomyelitis to his great right toe/foot.  Wound cultures showing  Providencia rettgeri and blood cultures positive for MSSA. Pharmacy consulted to dose Unasyn based on updated susceptibilities .   Plan:  Will restart the patient on Unasyn 3g IV q6H. Will continue to monitor cultures and susceptibilities.  Pharmacy will continue to monitor renal function and labs and make adjustments as needed.   Cher Nakai, PharmD Pharmacy Resident  11/02/2015,3:23 PM

## 2015-11-02 NOTE — Progress Notes (Signed)
MD aware of BP, per Dr. Elpidio AnisSudini, hold losartan, give lasix and metoprolol.

## 2015-11-02 NOTE — Evaluation (Signed)
Physical Therapy Evaluation Patient Details Name: Daniel [name] MRN: [id number] DOB: 13-Apr-1951 Today's Date: 11/02/2015   History of Present Illness  presented to ER secondary to SOB, chest tightness x2 days; admitted with unstable angina, NSTEMI (troponin peak at 0.82, now trending downward).  Also noted for R great toe redness with ulceration, significant for cellulitis and early osteomyelitis; pending angiography 12/30.  Clinical Impression  Upon evaluation, patient alert and oriented; follows simple commands.  Slightly impulsive with all functional movement; limited safety awareness noted.  Demonstrates functional strength in bilat UE/LEs, but ROM and WBing abilities of R LE/foot significantly limited by pain (6/10).  Currently requiring min assist for sit/stand, basic transfers with RW; min/mod assist for gait (20') with RW.  Significant balance deficits noted with mobility efforts and use of ortho-wedge shoe.  Unsafe to attempt indep at this time; unsafe for return home alone. No reports of chest pain/pressure with mobility this date; maintains sats >93% on RA at rest and with exertion. Would benefit from skilled PT to address above deficits and promote optimal return to PLOF; recommend transition to STR upon discharge from acute hospitalization.     Follow Up Recommendations SNF    Equipment Recommendations       Recommendations for Other Services       Precautions / Restrictions Precautions Precautions: Fall Precaution Comments: heel WBing with ortho-wedge shoe to R LE      Mobility  Bed Mobility Overal bed mobility: Modified Independent                Transfers Overall transfer level: Needs assistance Equipment used: Rolling walker (2 wheeled) Transfers: Sit to/from Stand Sit to Stand: Min assist         General transfer comment: cuing for hand placement, encouraged for R LE anterior to BOS as needed (to decrease WBing); ortho-wedge shoe donned  throughout  Ambulation/Gait Ambulation/Gait assistance: Min assist;Mod assist Ambulation Distance (Feet): 20 Feet Assistive device: Rolling walker (2 wheeled)       General Gait Details: step to gait pattern with ortho-wedge shoe; very short, shuffling steps; generally unsteady.  R lateral LOB x1 requiring mod assist from therapist for correction  Stairs            Wheelchair Mobility    Modified Rankin (Stroke Patients Only)       Balance Overall balance assessment: Needs assistance Sitting-balance support: No upper extremity supported;Feet supported Sitting balance-Leahy Scale: Good     Standing balance support: Bilateral upper extremity supported Standing balance-Leahy Scale: Fair                               Pertinent Vitals/Pain Pain Assessment: 0-10 Pain Score: 6  Pain Location: R foot Pain Descriptors / Indicators: Aching Pain Intervention(s): Limited activity within patient's tolerance;Monitored during session;Premedicated before session;Repositioned    Home Living Family/patient expects to be discharged to:: Private residence Living Arrangements: Alone   Type of Home: House Home Access: Stairs to enter Entrance Stairs-Rails: Right Entrance Stairs-Number of Steps: 3 Home Layout: Two level;Able to live on main level with bedroom/bathroom Home Equipment: Daniel [name] - single point      Prior Function Level of Independence: Independent with assistive device(s)         Comments: Intermittent use of SPC for all mobility; denies fall history outside of this admission (fell out of bed immed prior to presentation)     Hand Dominance  Extremity/Trunk Assessment   Upper Extremity Assessment: Overall WFL for tasks assessed           Lower Extremity Assessment: Generalized weakness (grossly at least 3+/5 throughout; not tested with resistance secondary to pain)         Communication   Communication: No difficulties  Cognition  Arousal/Alertness: Awake/alert Behavior During Therapy: WFL for tasks assessed/performed Overall Cognitive Status: Within Functional Limits for tasks assessed                      General Comments General comments (skin integrity, edema, etc.): R mid-foot distally significant erythema with ulceration/scabbing over medial surface 1st met head; dusky appearance to R great toe    Exercises Other Exercises Other Exercises: Educated in [facility] precautions (heel WBing), use of ortho-wedge shoe; extensive cuing for safety needs/awareness with all mobiltiy.  Patient voiced understanding; will reinforce in subsequent treatment sessions      Assessment/Plan    PT Assessment Patient needs continued PT services  PT Diagnosis Difficulty walking;Generalized weakness;Acute pain   PT Problem List Decreased strength;Decreased range of motion;Decreased activity tolerance;Decreased balance;Decreased mobility;Decreased knowledge of use of DME;Decreased safety awareness;Decreased knowledge of precautions;Decreased skin integrity;Pain  PT Treatment Interventions DME instruction;Gait training;Stair training;Functional mobility training;Therapeutic activities;Therapeutic exercise;Balance training;Patient/family education   PT Goals (Current goals can be found in the Care Plan section) Acute Rehab PT Goals Patient Stated Goal: "to get this pain better" PT Goal Formulation: With patient Time For Goal Achievement: 11/16/15 Potential to Achieve Goals: Good    Frequency Min 2X/week   Barriers to discharge Decreased caregiver support;Inaccessible home environment      Co-evaluation               End of Session Equipment Utilized During Treatment:  (ortho-wedge shoe) Activity Tolerance: Patient tolerated treatment well Patient left: in bed;with call bell/phone within reach;with bed alarm set Nurse Communication: Mobility status         Time: 6184-8592 PT Time Calculation (min) (ACUTE  ONLY): 15 min   Charges:   PT Evaluation $Initial PT Evaluation Tier I: 1 Procedure PT Treatments $Therapeutic Activity: 8-22 mins   PT G Codes:        Daniel [name], PT, DPT, NCS 11/02/2015, 10:58 AM [phone]

## 2015-11-02 NOTE — Consult Note (Signed)
ANTIBIOTIC CONSULT NOTE - INITIAL  Pharmacy Consult for Zosyn Indication: Cellulitis/osetomyelitis   No Known Allergies  Patient Measurements: Height: 5\' 8"  (172.7 cm) Weight: 176 lb 12.8 oz (80.196 kg) IBW/kg (Calculated) : 68.4  Vital Signs: Temp: 97.8 F (36.6 C) (12/28 1125) Temp Source: Oral (12/28 1125) BP: 163/98 mmHg (12/28 1125) Pulse Rate: 100 (12/28 1125) Intake/Output from previous day: 12/27 0701 - 12/28 0700 In: 580 [P.O.:480; IV Piggyback:100] Out: 1600 [Urine:1600]  Recent Labs  10/30/15 1732 10/31/15 0540 11/01/15 0247 11/02/15 0549  WBC 12.0* 10.3 10.2  --   HGB 13.5 12.5* 12.1*  --   PLT 207 184 205  --   CREATININE  --  1.49* 1.34* 1.21   Estimated Creatinine Clearance: 60.5 mL/min (by C-G formula based on Cr of 1.21).  Recent Labs  11/01/15 1701  VANCOTROUGH 18     Microbiology: Recent Results (from the past 720 hour(s))  Culture, blood (routine x 2)     Status: None (Preliminary result)   Collection Time: 10/30/15  4:44 PM  Result Value Ref Range Status   Specimen Description BLOOD LEFT WRIST  Final   Special Requests BOTTLES DRAWN AEROBIC AND ANAEROBIC  2CC  Final   Culture  Setup Time   Final    GRAM POSITIVE COCCI IN CLUSTERS AEROBIC BOTTLE ONLY CRITICAL RESULT CALLED TO, READ BACK BY AND VERIFIED WITH: JASON ROBBINS AT 1710 ON 10/31/15 CTJ    Culture STAPHYLOCOCCUS AUREUS AEROBIC BOTTLE ONLY   Final   Report Status PENDING  Incomplete   Organism ID, Bacteria STAPHYLOCOCCUS AUREUS  Final      Susceptibility   Staphylococcus aureus - MIC*    CIPROFLOXACIN <=0.5 SENSITIVE Sensitive     ERYTHROMYCIN 0.5 SENSITIVE Sensitive     GENTAMICIN <=0.5 SENSITIVE Sensitive     OXACILLIN <=0.25 SENSITIVE Sensitive     TRIMETH/SULFA <=10 SENSITIVE Sensitive     CLINDAMYCIN <=0.25 SENSITIVE Sensitive     CEFOXITIN SCREEN NEGATIVE Sensitive     Inducible Clindamycin NEGATIVE Sensitive     TETRACYCLINE Value in next row Sensitive    SENSITIVE<=1    * STAPHYLOCOCCUS AUREUS  Blood Culture ID Panel (Reflexed)     Status: Abnormal   Collection Time: 10/30/15  4:44 PM  Result Value Ref Range Status   Enterococcus species NOT DETECTED NOT DETECTED Final   Listeria monocytogenes NOT DETECTED NOT DETECTED Final   Staphylococcus species DETECTED (A) NOT DETECTED Corrected    Comment: CORRECTED ON 12/27 AT 0801: PREVIOUSLY REPORTED AS NOT DETECTED   Staphylococcus aureus DETECTED (A) NOT DETECTED Final    Comment: CRITICAL RESULT CALLED TO, READ BACK BY AND VERIFIED WITH: JASON ROBBINS AT 1710 ON 10/31/15 CTJ    Streptococcus species NOT DETECTED NOT DETECTED Final   Streptococcus agalactiae NOT DETECTED NOT DETECTED Final   Streptococcus pneumoniae NOT DETECTED NOT DETECTED Final   Streptococcus pyogenes NOT DETECTED NOT DETECTED Final   Acinetobacter baumannii NOT DETECTED NOT DETECTED Final   Enterobacteriaceae species NOT DETECTED NOT DETECTED Final   Enterobacter cloacae complex NOT DETECTED NOT DETECTED Final   Escherichia coli NOT DETECTED NOT DETECTED Final   Klebsiella oxytoca NOT DETECTED NOT DETECTED Final   Klebsiella pneumoniae NOT DETECTED NOT DETECTED Final   Proteus species NOT DETECTED NOT DETECTED Final   Serratia marcescens NOT DETECTED NOT DETECTED Final   Haemophilus influenzae NOT DETECTED NOT DETECTED Final   Neisseria meningitidis NOT DETECTED NOT DETECTED Final   Pseudomonas aeruginosa NOT DETECTED NOT  DETECTED Final   Candida albicans NOT DETECTED NOT DETECTED Final   Candida glabrata NOT DETECTED NOT DETECTED Final   Candida krusei NOT DETECTED NOT DETECTED Final   Candida parapsilosis NOT DETECTED NOT DETECTED Final   Candida tropicalis NOT DETECTED NOT DETECTED Final   Carbapenem resistance NOT DETECTED NOT DETECTED Final   Methicillin resistance NOT DETECTED NOT DETECTED Final   Vancomycin resistance NOT DETECTED NOT DETECTED Final  Culture, blood (routine x 2)     Status: None    Collection Time: 10/30/15  5:31 PM  Result Value Ref Range Status   Specimen Description BLOOD LEFT HAND  Final   Special Requests BOTTLES DRAWN AEROBIC AND ANAEROBIC  2CC  Final   Culture  Setup Time   Final    GRAM POSITIVE COCCI IN CHAINS IN BOTH AEROBIC AND ANAEROBIC BOTTLES CRITICAL RESULT CALLED TO, READ BACK BY AND VERIFIED WITH: KAREN HAYES AT 1045 10/31/15 CTJ    Culture   Final    VIRIDANS STREPTOCOCCUS IN BOTH AEROBIC AND ANAEROBIC BOTTLES MULTIPLE SPECIES PRESENT Results consistent with contamination.    Report Status 11/02/2015 FINAL  Final  Blood Culture ID Panel (Reflexed)     Status: Abnormal   Collection Time: 10/30/15  5:31 PM  Result Value Ref Range Status   Enterococcus species NOT DETECTED NOT DETECTED Final   Listeria monocytogenes NOT DETECTED NOT DETECTED Final   Staphylococcus species NOT DETECTED NOT DETECTED Final   Staphylococcus aureus NOT DETECTED NOT DETECTED Final   Streptococcus species DETECTED (A) NOT DETECTED Final    Comment: CRITICAL RESULT CALLED TO, READ BACK BY AND VERIFIED WITH: KAREN HAYES AT 1045 ON 10/31/15 CTJ    Streptococcus agalactiae NOT DETECTED NOT DETECTED Final   Streptococcus pneumoniae NOT DETECTED NOT DETECTED Final   Streptococcus pyogenes NOT DETECTED NOT DETECTED Final   Acinetobacter baumannii NOT DETECTED NOT DETECTED Final   Enterobacteriaceae species NOT DETECTED NOT DETECTED Final   Enterobacter cloacae complex NOT DETECTED NOT DETECTED Final   Escherichia coli NOT DETECTED NOT DETECTED Final   Klebsiella oxytoca NOT DETECTED NOT DETECTED Final   Klebsiella pneumoniae NOT DETECTED NOT DETECTED Final   Proteus species NOT DETECTED NOT DETECTED Final   Serratia marcescens NOT DETECTED NOT DETECTED Final   Haemophilus influenzae NOT DETECTED NOT DETECTED Final   Neisseria meningitidis NOT DETECTED NOT DETECTED Final   Pseudomonas aeruginosa NOT DETECTED NOT DETECTED Final   Candida albicans NOT DETECTED NOT  DETECTED Final   Candida glabrata NOT DETECTED NOT DETECTED Final   Candida krusei NOT DETECTED NOT DETECTED Final   Candida parapsilosis NOT DETECTED NOT DETECTED Final   Candida tropicalis NOT DETECTED NOT DETECTED Final   Carbapenem resistance NOT DETECTED NOT DETECTED Final   Methicillin resistance NOT DETECTED NOT DETECTED Final   Vancomycin resistance NOT DETECTED NOT DETECTED Final  Wound culture     Status: None (Preliminary result)   Collection Time: 10/31/15  2:12 PM  Result Value Ref Range Status   Specimen Description ULCER  Final   Special Requests NONE  Final   Gram Stain   Final    RARE WBC SEEN MODERATE GRAM POSITIVE COCCI IN PAIRS IN CLUSTERS RARE GRAM NEGATIVE RODS RARE GRAM POSITIVE RODS    Culture   Final    MODERATE GROWTH PROVIDENCIA RETTGERI HEAVY GROWTH STAPHYLOCOCCUS AUREUS SUSCEPTIBILITIES TO FOLLOW    Report Status PENDING  Incomplete   Organism ID, Bacteria PROVIDENCIA RETTGERI  Final  Susceptibility   Providencia rettgeri - MIC*    AMPICILLIN 4 RESISTANT Resistant     CEFTAZIDIME <=1 SENSITIVE Sensitive     CEFAZOLIN >=64 RESISTANT Resistant     CEFTRIAXONE <=1 SENSITIVE Sensitive     CIPROFLOXACIN <=0.25 SENSITIVE Sensitive     GENTAMICIN 2 SENSITIVE Sensitive     IMIPENEM 1 SENSITIVE Sensitive     TRIMETH/SULFA >=320 RESISTANT Resistant     PIP/TAZO Value in next row Sensitive      SENSITIVE<=4    * MODERATE GROWTH PROVIDENCIA RETTGERI    Medical History: Past Medical History  Diagnosis Date  . Diabetes mellitus without complication (HCC)   . Stented coronary artery   . Myocardial infarct (HCC)   . Hypertension   . Coronary artery disease     Medications:  Scheduled:  . aspirin EC  81 mg Oral Daily  . atorvastatin  40 mg Oral QHS  . enoxaparin (LOVENOX) injection  40 mg Subcutaneous Q24H  . furosemide  40 mg Oral BID  . glipiZIDE  10 mg Oral BID AC  . insulin aspart  0-9 Units Subcutaneous TID AC & HS  . insulin detemir  10  Units Subcutaneous QHS  . losartan  100 mg Oral Daily  . metFORMIN  1,000 mg Oral BID WC  . metoprolol tartrate  25 mg Oral BID  . pantoprazole  40 mg Oral QAC breakfast  . pregabalin  75 mg Oral BID   Assessment: Pt is a 64 year old male with cellulitis/osetomyelitis to his great right toe/foot.  Wound cultures showing staph aureus and Providencia rettgeri. Pharmacy consulted to dose Zosyn.  Patient was previously on Vancomycin and Unasyn.   Goal of Therapy:  resolution of infection  Plan:  Will start patient on Zosyn 3.375g IV q8 hours.  Will continue to follow up on culture results and monitor renal function. Dose adjustments will be made as needed.    Cher Nakai, PharmD Pharmacy Resident  11/02/2015,1:43 PM

## 2015-11-02 NOTE — Progress Notes (Signed)
[facility] [place] [facility]                                                                                                                                                                                            Patient Demographics   Daniel [name], is a 64 y.o. male, DOB - [birth year], UJW:[id number]  Admit date - 10/30/2015   Admitting Physician Ramonita Lab, MD  Outpatient Primary MD for the patient is [name],[name], MD   LOS - 3  Subjective: Patient admitted with shortness of breath,  right toe erythema and pain, also left sided new eye visual difficulties.  Continues to have pain right foot. SOB improved. No CP. Afebrile.  Off Oxygen    Review of Systems:   CONSTITUTIONAL: No documented fever. No fatigue, weakness. No weight gain, no weight loss.  EYES: No blurry or double vision. Left eye visual difficulties ENT: No tinnitus. No postnasal drip. No redness of the oropharynx.  RESPIRATORY: No cough, no wheeze, no hemoptysis. Positive dyspnea.  CARDIOVASCULAR: No chest pain. No orthopnea. No palpitations. No syncope.  GASTROINTESTINAL: No nausea, no vomiting or diarrhea. No abdominal pain. No melena or hematochezia.  GENITOURINARY: No dysuria or hematuria.  ENDOCRINE: No polyuria or nocturia. No heat or cold intolerance.  HEMATOLOGY: No anemia. No bruising. No bleeding.  INTEGUMENTARY: No rashes. No lesions.  MUSCULOSKELETAL: No arthritis. No swelling. No gout.  NEUROLOGIC: No numbness, tingling, or ataxia. No seizure-type activity. Generalized weakness PSYCHIATRIC: No anxiety. No insomnia. No ADD.    Vitals:   Filed Vitals:   11/02/15 0557 11/02/15 1000 11/02/15 1050 11/02/15 1125  BP: 130/76 107/63  163/98  Pulse: 80 73  100  Temp: 98 F (36.7 C)   97.8 F (36.6 C)  TempSrc:    Oral  Resp: 20   18  Height:      Weight: 80.196 kg (176 lb 12.8 oz)     SpO2: 94%  94% 94%    Wt Readings from Last 3 Encounters:  11/02/15  80.196 kg (176 lb 12.8 oz)  04/27/15 74.844 kg (165 lb)     Intake/Output Summary (Last 24 hours) at 11/02/15 1321 Last data filed at 11/02/15 1256  Gross per 24 hour  Intake    340 ml  Output   1900 ml  Net  -1560 ml    Physical Exam:   GENERAL: Pleasant-appearing in no apparent distress.  HEAD, EYES, EARS, NOSE AND THROAT: Atraumatic, normocephalic. Extraocular muscles are intact. Pupils equal and reactive to light. Sclerae anicteric. No conjunctival injection. No oro-pharyngeal erythema.  NECK: Supple. There is no  jugular venous distention. No bruits, no lymphadenopathy, no thyromegaly.  HEART: Regular rate and rhythm,. No murmurs, no rubs, no clicks.  LUNGS: Bilateral crackles at the bases ABDOMEN: Soft, flat, nontender, nondistended. Has good bowel sounds. No hepatosplenomegaly appreciated.  EXTREMITIES: No evidence of any cyanosis, clubbing, or peripheral edema.  +2 pedal and radial pulses bilaterally.  NEUROLOGIC: The patient is alert, awake, and oriented x3 with no focal motor deficits appreciated bilaterally. Decreased sensations feet SKIN: Right great toe erythematous surrounding cellulitis  Psych: Not anxious, depressed LN: No inguinal LN enlargement    Antibiotics   Anti-infectives    Start     Dose/Rate Route Frequency Ordered Stop   10/31/15 1130  Ampicillin-Sulbactam (UNASYN) 3 g in sodium chloride 0.9 % 100 mL IVPB     3 g 100 mL/hr over 60 Minutes Intravenous Every 6 hours 10/31/15 1035     10/31/15 0500  vancomycin (VANCOCIN) IVPB 1000 mg/200 mL premix     1,000 mg 200 mL/hr over 60 Minutes Intravenous Every 12 hours 10/30/15 2248     10/30/15 2130  vancomycin (VANCOCIN) IVPB 1000 mg/200 mL premix  Status:  Discontinued     1,000 mg 200 mL/hr over 60 Minutes Intravenous  Once 10/30/15 2120 10/30/15 2141   10/30/15 1645  piperacillin-tazobactam (ZOSYN) IVPB 3.375 g     3.375 g 100 mL/hr over 30 Minutes Intravenous  Once 10/30/15 1633 10/30/15 2019    10/30/15 1645  vancomycin (VANCOCIN) IVPB 1000 mg/200 mL premix     1,000 mg 200 mL/hr over 60 Minutes Intravenous  Once 10/30/15 1633 10/30/15 2019      Medications   Scheduled Meds: . ampicillin-sulbactam (UNASYN) IV  3 g Intravenous Q6H  . aspirin EC  81 mg Oral Daily  . atorvastatin  40 mg Oral QHS  . enoxaparin (LOVENOX) injection  40 mg Subcutaneous Q24H  . furosemide  40 mg Intravenous Q12H  . glipiZIDE  10 mg Oral BID AC  . insulin aspart  0-9 Units Subcutaneous TID AC & HS  . losartan  100 mg Oral Daily  . metFORMIN  1,000 mg Oral BID WC  . metoprolol tartrate  25 mg Oral BID  . pantoprazole  40 mg Oral QAC breakfast  . pregabalin  75 mg Oral BID  . vancomycin  1,000 mg Intravenous Q12H   Continuous Infusions:   PRN Meds:.acetaminophen, morphine injection, nitroGLYCERIN, ondansetron (ZOFRAN) IV, oxyCODONE   Data Review:   Micro Results Recent Results (from the past 240 hour(s))  Culture, blood (routine x 2)     Status: None (Preliminary result)   Collection Time: 10/30/15  4:44 PM  Result Value Ref Range Status   Specimen Description BLOOD LEFT WRIST  Final   Special Requests BOTTLES DRAWN AEROBIC AND ANAEROBIC  2CC  Final   Culture  Setup Time   Final    GRAM POSITIVE COCCI IN CLUSTERS AEROBIC BOTTLE ONLY CRITICAL RESULT CALLED TO, READ BACK BY AND VERIFIED WITH: JASON ROBBINS AT 1710 ON 10/31/15 CTJ    Culture STAPHYLOCOCCUS AUREUS AEROBIC BOTTLE ONLY   Final   Report Status PENDING  Incomplete   Organism ID, Bacteria STAPHYLOCOCCUS AUREUS  Final      Susceptibility   Staphylococcus aureus - MIC*    CIPROFLOXACIN <=0.5 SENSITIVE Sensitive     ERYTHROMYCIN 0.5 SENSITIVE Sensitive     GENTAMICIN <=0.5 SENSITIVE Sensitive     OXACILLIN <=0.25 SENSITIVE Sensitive     TRIMETH/SULFA <=10 SENSITIVE Sensitive  CLINDAMYCIN <=0.25 SENSITIVE Sensitive     CEFOXITIN SCREEN NEGATIVE Sensitive     Inducible Clindamycin NEGATIVE Sensitive     TETRACYCLINE Value  in next row Sensitive      SENSITIVE<=1    * STAPHYLOCOCCUS AUREUS  Blood Culture ID Panel (Reflexed)     Status: Abnormal   Collection Time: 10/30/15  4:44 PM  Result Value Ref Range Status   Enterococcus species NOT DETECTED NOT DETECTED Final   Listeria monocytogenes NOT DETECTED NOT DETECTED Final   Staphylococcus species DETECTED (A) NOT DETECTED Corrected    Comment: CORRECTED ON 12/27 AT 0801: PREVIOUSLY REPORTED AS NOT DETECTED   Staphylococcus aureus DETECTED (A) NOT DETECTED Final    Comment: CRITICAL RESULT CALLED TO, READ BACK BY AND VERIFIED WITH: JASON ROBBINS AT 1710 ON 10/31/15 CTJ    Streptococcus species NOT DETECTED NOT DETECTED Final   Streptococcus agalactiae NOT DETECTED NOT DETECTED Final   Streptococcus pneumoniae NOT DETECTED NOT DETECTED Final   Streptococcus pyogenes NOT DETECTED NOT DETECTED Final   Acinetobacter baumannii NOT DETECTED NOT DETECTED Final   Enterobacteriaceae species NOT DETECTED NOT DETECTED Final   Enterobacter cloacae complex NOT DETECTED NOT DETECTED Final   Escherichia coli NOT DETECTED NOT DETECTED Final   Klebsiella oxytoca NOT DETECTED NOT DETECTED Final   Klebsiella pneumoniae NOT DETECTED NOT DETECTED Final   Proteus species NOT DETECTED NOT DETECTED Final   Serratia marcescens NOT DETECTED NOT DETECTED Final   Haemophilus influenzae NOT DETECTED NOT DETECTED Final   Neisseria meningitidis NOT DETECTED NOT DETECTED Final   Pseudomonas aeruginosa NOT DETECTED NOT DETECTED Final   Candida albicans NOT DETECTED NOT DETECTED Final   Candida glabrata NOT DETECTED NOT DETECTED Final   Candida krusei NOT DETECTED NOT DETECTED Final   Candida parapsilosis NOT DETECTED NOT DETECTED Final   Candida tropicalis NOT DETECTED NOT DETECTED Final   Carbapenem resistance NOT DETECTED NOT DETECTED Final   Methicillin resistance NOT DETECTED NOT DETECTED Final   Vancomycin resistance NOT DETECTED NOT DETECTED Final  Culture, blood (routine x 2)      Status: None   Collection Time: 10/30/15  5:31 PM  Result Value Ref Range Status   Specimen Description BLOOD LEFT HAND  Final   Special Requests BOTTLES DRAWN AEROBIC AND ANAEROBIC  2CC  Final   Culture  Setup Time   Final    GRAM POSITIVE COCCI IN CHAINS IN BOTH AEROBIC AND ANAEROBIC BOTTLES CRITICAL RESULT CALLED TO, READ BACK BY AND VERIFIED WITH: KAREN HAYES AT 1045 10/31/15 CTJ    Culture   Final    VIRIDANS STREPTOCOCCUS IN BOTH AEROBIC AND ANAEROBIC BOTTLES MULTIPLE SPECIES PRESENT Results consistent with contamination.    Report Status 11/02/2015 FINAL  Final  Blood Culture ID Panel (Reflexed)     Status: Abnormal   Collection Time: 10/30/15  5:31 PM  Result Value Ref Range Status   Enterococcus species NOT DETECTED NOT DETECTED Final   Listeria monocytogenes NOT DETECTED NOT DETECTED Final   Staphylococcus species NOT DETECTED NOT DETECTED Final   Staphylococcus aureus NOT DETECTED NOT DETECTED Final   Streptococcus species DETECTED (A) NOT DETECTED Final    Comment: CRITICAL RESULT CALLED TO, READ BACK BY AND VERIFIED WITH: KAREN HAYES AT 1045 ON 10/31/15 CTJ    Streptococcus agalactiae NOT DETECTED NOT DETECTED Final   Streptococcus pneumoniae NOT DETECTED NOT DETECTED Final   Streptococcus pyogenes NOT DETECTED NOT DETECTED Final   Acinetobacter baumannii NOT DETECTED NOT DETECTED Final  Enterobacteriaceae species NOT DETECTED NOT DETECTED Final   Enterobacter cloacae complex NOT DETECTED NOT DETECTED Final   Escherichia coli NOT DETECTED NOT DETECTED Final   Klebsiella oxytoca NOT DETECTED NOT DETECTED Final   Klebsiella pneumoniae NOT DETECTED NOT DETECTED Final   Proteus species NOT DETECTED NOT DETECTED Final   Serratia marcescens NOT DETECTED NOT DETECTED Final   Haemophilus influenzae NOT DETECTED NOT DETECTED Final   Neisseria meningitidis NOT DETECTED NOT DETECTED Final   Pseudomonas aeruginosa NOT DETECTED NOT DETECTED Final   Candida albicans NOT  DETECTED NOT DETECTED Final   Candida glabrata NOT DETECTED NOT DETECTED Final   Candida krusei NOT DETECTED NOT DETECTED Final   Candida parapsilosis NOT DETECTED NOT DETECTED Final   Candida tropicalis NOT DETECTED NOT DETECTED Final   Carbapenem resistance NOT DETECTED NOT DETECTED Final   Methicillin resistance NOT DETECTED NOT DETECTED Final   Vancomycin resistance NOT DETECTED NOT DETECTED Final  Wound culture     Status: None (Preliminary result)   Collection Time: 10/31/15  2:12 PM  Result Value Ref Range Status   Specimen Description ULCER  Final   Special Requests NONE  Final   Gram Stain   Final    RARE WBC SEEN MODERATE GRAM POSITIVE COCCI IN PAIRS IN CLUSTERS RARE GRAM NEGATIVE RODS RARE GRAM POSITIVE RODS    Culture   Final    MODERATE GROWTH PROVIDENCIA RETTGERI HEAVY GROWTH STAPHYLOCOCCUS AUREUS SUSCEPTIBILITIES TO FOLLOW    Report Status PENDING  Incomplete   Organism ID, Bacteria PROVIDENCIA RETTGERI  Final      Susceptibility   Providencia rettgeri - MIC*    AMPICILLIN 4 RESISTANT Resistant     CEFTAZIDIME <=1 SENSITIVE Sensitive     CEFAZOLIN >=64 RESISTANT Resistant     CEFTRIAXONE <=1 SENSITIVE Sensitive     CIPROFLOXACIN <=0.25 SENSITIVE Sensitive     GENTAMICIN 2 SENSITIVE Sensitive     IMIPENEM 1 SENSITIVE Sensitive     TRIMETH/SULFA >=320 RESISTANT Resistant     PIP/TAZO Value in next row Sensitive      SENSITIVE<=4    * MODERATE GROWTH PROVIDENCIA RETTGERI    Radiology Reports Ct Head Wo Contrast  10/30/2015  CLINICAL DATA:  Generalized weakness, diabetes, hypertension, right visual disturbance EXAM: CT HEAD WITHOUT CONTRAST TECHNIQUE: Contiguous axial images were obtained from the base of the skull through the vertex without contrast. COMPARISON:  03/01/2011 FINDINGS: Mild brain atrophy and chronic white matter microvascular ischemic changes throughout the periventricular white matter. These changes have slightly progressed. No acute  intracranial hemorrhage, mass lesion, definite infarction, midline shift, herniation, hydrocephalus, or extra-axial fluid collection. Cisterns patent. No cerebellar abnormality. Skull intact. Mastoids and sinuses remain clear. Atherosclerosis of the intracranial vessels. IMPRESSION: Atrophy and slight progression of chronic white matter microvascular ischemic changes. No acute intracranial process by noncontrast CT. Electronically Signed   By: Judie Petit.  Shick M.D.   On: 10/30/2015 18:15   Mr Laqueta Jean ZO Contrast  11/01/2015  CLINICAL DATA:  64 year old male with progressive lower extremity weakness for 6 months. Initial encounter. Diabetes EXAM: MRI HEAD WITHOUT AND WITH CONTRAST TECHNIQUE: Multiplanar, multiecho pulse sequences of the brain and surrounding structures were obtained without and with intravenous contrast. CONTRAST:  16mL MULTIHANCE GADOBENATE DIMEGLUMINE 529 MG/ML IV SOLN COMPARISON:  Head CT without contrast 10/30/2015. [facility] postoperative cervical spine radiograph 07/19/2011. FINDINGS: Major intracranial vascular flow voids are preserved. Late subacute to early chronic appearing right corona radiata 10 mm focus  of diffusion signal change (series 100, image 36) with T2 shine through. No restricted diffusion or evidence of acute infarction. Additional patchy bilateral cerebral white matter T2 and FLAIR hyperintensity. Chronic lacunar infarct in the *SCRATCH* SPECT chronic lacunar infarcts in the right thalamus. Mild for age patchy T2 hyperintensity in the pons, but with associated pontine chronic micro hemorrhage (series 9, image 8). No cortical encephalomalacia. No midline shift, mass effect, evidence of mass lesion, ventriculomegaly, extra-axial collection or acute intracranial hemorrhage. Cervicomedullary junction and pituitary are within normal limits. Negative visualized cervical spine aside from hardware susceptibility artifact. No abnormal enhancement identified.  Incidental anterior right frontal lobe developmental venous anomaly (normal anatomic variant). Visible internal auditory structures appear normal. Mastoids and paranasal sinuses are clear. Postoperative changes to the globes. Otherwise negative scalp and orbits soft tissues. Normal bone marrow signal. IMPRESSION: No acute intracranial abnormality. Moderate for age chronic small vessel disease with late subacute lacune in the right corona radiata. Electronically Signed   By: Odessa Fleming M.D.   On: 11/01/2015 10:19   Mr Foot Right Wo Contrast  11/01/2015  CLINICAL DATA:  Ulceration in a about 7 mm along the medial right great toe with purulence noted. Erythema extends dorsally in the foot. EXAM: MRI OF THE RIGHT FOREFOOT WITHOUT CONTRAST TECHNIQUE: Multiplanar, multisequence MR imaging was performed. No intravenous contrast was administered. COMPARISON:  10/30/2015 FINDINGS: There is subcutaneous edema diffusely in the foot especially dorsally, and extending into the ankle both medially and laterally. Low-level edema extends into the toes. On image 32 series 4 there is a defect in the skin and subcutaneous tissues medially along the great toe approximately at the level of the distal phalanx. The on images 23-25 of series 7 there seems to be abnormal increased inversion recovery weighted signal in the base of the distal phalanx suspicious for early osteomyelitis given the immediately adjacent ulceration. Sharply defined erosions or cystic lesions are present along the Lisfranc joint is, including adjacent to the expected attachment site of the Lisfranc ligament to the second metatarsal base. However, there is no overt malalignment at the Lisfranc joint. Erosions are present at the articulation of the navicular and medial cuneiform. There is thickening of the medial band of the plantar fascia proximally. IMPRESSION: 1. Ulceration medially along the great toe, with underlying edema signal in the marrow of the base of the  distal phalanx suspicious for early osteomyelitis. No other osteomyelitis identified. 2. Cystic or erosive lesions along significant portions of the Lisfranc joint and at the articulation of the distal navicular with the medial cuneiform, suspicious for erosive arthropathy such as rheumatoid arthropathy. An alternative would be early Charcot joint. 3. Edema tracking throughout the foot and ankle, especially dorsally, potentially from cellulitis. Electronically Signed   By: Gaylyn Rong M.D.   On: 11/01/2015 10:37   Dg Chest Port 1 View  10/30/2015  CLINICAL DATA:  Generalized weakness and elevated blood glucose today. EXAM: PORTABLE CHEST 1 VIEW COMPARISON:  CT chest 04/27/2015. PA and lateral chest 04/27/2015 and 01/22/2015. FINDINGS: Heart size is normal. The lungs are clear. No pneumothorax or pleural effusion. IMPRESSION: No acute disease. Electronically Signed   By: Drusilla Kanner M.D.   On: 10/30/2015 15:14   Dg Toe Great Right  10/30/2015  CLINICAL DATA:  Right toe swelling for 2 weeks.  Diabetic patient. EXAM: RIGHT GREAT TOE COMPARISON:  None. FINDINGS: There is no evidence of fracture or dislocation. There is no evidence of cortical erosions to suggest osteomyelitis radiographically.  Heterogeneous appearance of the bony matrix of the first phalanx is noted, with uncertain significance. There is soft tissue swelling of the dorsal and palmar aspect of the mid and forefoot. IMPRESSION: No evidence of fracture or subluxation. No definitive radiographic evidence of osteomyelitis. Heterogeneous appearance of the bony matrix of the first digit with uncertain significance. Electronically Signed   By: Ted Mcalpine M.D.   On: 10/30/2015 13:51     CBC  Recent Labs Lab 10/30/15 1732 10/31/15 0540 11/01/15 0247  WBC 12.0* 10.3 10.2  HGB 13.5 12.5* 12.1*  HCT 40.3 36.8* 36.1*  PLT 207 184 205  MCV 90.8 89.0 89.0  MCH 30.4 30.2 29.8  MCHC 33.5 34.0 33.5  RDW 14.2 14.5 14.5   LYMPHSABS 0.9* 1.4  --   MONOABS 1.1* 1.3*  --   EOSABS 0.0 0.0  --   BASOSABS 0.0 0.0  --     Chemistries   Recent Labs Lab 10/30/15 1424 10/31/15 0540 11/01/15 0247 11/02/15 0549  NA 139 138 137  --   K 3.6 3.8 3.5  --   CL 99* 101 99*  --   CO2 25 28 26   --   GLUCOSE 395* 215* 187*  --   BUN 18 27* 41*  --   CREATININE 0.91 1.49* 1.34* 1.21  CALCIUM 9.0 8.1* 8.0*  --   AST 23  --   --   --   ALT 17  --   --   --   ALKPHOS 97  --   --   --   BILITOT 1.3*  --   --   --    ------------------------------------------------------------------------------------------------------------------ estimated creatinine clearance is 60.5 mL/min (by C-G formula based on Cr of 1.21). ------------------------------------------------------------------------------------------------------------------  Recent Labs  10/31/15 0540  HGBA1C 12.2*   ------------------------------------------------------------------------------------------------------------------ No results for input(s): CHOL, HDL, LDLCALC, TRIG, CHOLHDL, LDLDIRECT in the last 72 hours. ------------------------------------------------------------------------------------------------------------------  Recent Labs  10/31/15 0540  TSH 1.595   ------------------------------------------------------------------------------------------------------------------ No results for input(s): VITAMINB12, FOLATE, FERRITIN, TIBC, IRON, RETICCTPCT in the last 72 hours.  Coagulation profile  Recent Labs Lab 10/30/15 1331 10/31/15 0540  INR 1.10 1.23    No results for input(s): DDIMER in the last 72 hours.  Cardiac Enzymes  Recent Labs Lab 10/30/15 2253 10/31/15 0540 10/31/15 1135  TROPONINI 0.66* 0.82* 0.71*   ------------------------------------------------------------------------------------------------------------------ Invalid input(s): POCBNP    Assessment & Plan   # PAD Angiogram on 11/04/2015.  # Elevated  troponin likely from CHF and Infection Stop heparin. No further workup advised by cardiology. ASA, Statin, BB  # Acute hypoxic respiratory failure secondary to acute on chronic systolic chf - EF 20-25% Change lasix to PO Monitor daily weights and intake and output Continue aspirin, beta blocker and statin  # Right great toe and foot cellulitis with osteomyelitis and bacteremia Continue vancomycin and Unasyn. Patient also has blood cultures which are positive. 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 Shaune Pollack has discussed with on-call ophthalmologist Dr. Duke Salvia, who has recommended outpatient follow-up after discharge  # Diabetes mellitus Sliding scale insulin blood sugars elevated. On glipizide. Added metformin     Code Status Orders        Start     Ordered   10/30/15 2249  Full code   Continuous     10/30/15 2248    Advance Directive Documentation  Most Recent Value   Type of Advance Directive  Healthcare Power of Attorney   Pre-existing out of facility DNR order (yellow form or pink MOST form)     "MOST" Form in Place?        Consults  Cardiology  DVT Prophylaxis  heparin  Lab Results  Component Value Date   PLT 205 11/01/2015     Time Spent in minutes   35 minutes  Milagros Loll R M.D on 11/02/2015 at 1:21 PM  Between 7am to 6pm - Pager - [phone]  After 6pm go to [email or web address] - password [email or web address] [facility]  [facility]   Office  [phone]

## 2015-11-02 NOTE — Clinical Social Work Placement (Signed)
   CLINICAL SOCIAL WORK PLACEMENT  NOTE  Date:  11/02/2015  Patient Details  Name: Daniel [name] MRN: [id number] Date of Birth: 08/07/1951  Clinical Social Work is seeking post-discharge placement for this patient at the Skilled  Nursing Facility level of care (*CSW will initial, date and re-position this form in  chart as items are completed):  Yes   Patient/family provided with [place] Clinical Social Work Department's list of facilities offering this level of care within the geographic area requested by the patient (or if unable, by the patient's family).  Yes   Patient/family informed of their freedom to choose among providers that offer the needed level of care, that participate in Medicare, Medicaid or managed care program needed by the patient, have an available bed and are willing to accept the patient.  Yes   Patient/family informed of [place] [facility]'s ownership interest [facility] and [facility], as well as of the fact that they are under no obligation to receive care at these facilities.  PASRR submitted to EDS on 11/01/15     PASRR number received on 11/01/15     Existing PASRR number confirmed on       FL2 transmitted to all facilities in geographic area requested by pt/family on 11/02/15     FL2 transmitted to all facilities within larger geographic area on       Patient informed that his/her managed care company has contracts with or will negotiate with certain facilities, including the following:            Patient/family informed of bed offers received.  Patient chooses bed at       Physician recommends and patient chooses bed at      Patient to be transferred to   on  .  Patient to be transferred to facility by       Patient family notified on   of transfer.  Name of family member notified:        PHYSICIAN       Additional Comment:    _______________________________________________ Idamae Lusherhristina E Hailley Byers, LCSW 11/02/2015, 12:25 PM

## 2015-11-02 NOTE — Progress Notes (Signed)
Re-evaluated today.  Vascular planning procedure for Friday. Erythema on dorsal foot not regressing. Early dusky appearance of great toe from IPJ distally noted today. If erythema continue to progress can consider I & D. Reviewed MRI for abscess and not noted.

## 2015-11-02 NOTE — Progress Notes (Signed)
Inpatient Diabetes Program Recommendations  AACE/ADA: New Consensus Statement on Inpatient Glycemic Control (2015)  Target Ranges:  Prepandial:   less than 140 mg/dL      Peak postprandial:   less than 180 mg/dL (1-2 hours)      Critically ill patients:  140 - 180 mg/dL   Review of Glycemic Control  Diabetes history: DM2 Outpatient Diabetes medications: Glipizide 10 mg BID, Metformin 1000 mg BID, Actos 30 mg daily Current orders for Inpatient glycemic control: Glipizide 10 mg BID, Novolog 0-9 units ACHS  Inpatient Diabetes Program Recommendations: Oral Agents: Please consider increasing Glipizide to 15 mg BID.  Thanks, Daniel PennerMarie Alexius Ellington, RN, MSN, CDE Diabetes Coordinator Inpatient Diabetes Program [phone] (Team Pager from 8am to 5pm) [phone] ([facility] office) [phone] [facility] office) [phone] [facility] office)

## 2015-11-02 NOTE — Progress Notes (Signed)
Clinical Education officer, museum (CSW) met with patient alone at bedside to discuss D/C plan. CSW explained to patient that PT is recommending SNF. Patient was sitting in bed with eyes closed and was startled when CSW knocked on the door. Patient was oriented and laying in the bed. Patient reports that he cannot walk. Patient stated that he is agreeable to SNF search in [facility] but did not provide preferences. Patient told CSW to speak to his [name]) about SNF preferences.   FL2 completed. Patient faxed to [facility].   CSW met with patient's brother, Daniel [name] and informed him that PT recommended SNF placement for patient. CSW inquired about SNF preferences. He reported that [name] would be his preference for his brother. Patient's brother voiced concerns about patient returning home after SNF placement. CSW reviewed of long term care and Medicaid options discussed with him yesterday. Patient's brother reported he had the number given to him yesterday to assist with a Medicaid application for patient. CSW will continue to follow and assist as needed.   Ernest Pine, MSW, [place] [facility] Department [phone]

## 2015-11-03 ENCOUNTER — Encounter: Payer: Self-pay | Admitting: Anesthesiology

## 2015-11-03 LAB — GLUCOSE, CAPILLARY
GLUCOSE-CAPILLARY: 186 mg/dL — AB (ref 65–99)
GLUCOSE-CAPILLARY: 155 mg/dL — AB (ref 65–99)
Glucose-Capillary: 81 mg/dL (ref 65–99)
Glucose-Capillary: 179 mg/dL — ABNORMAL HIGH (ref 65–99)
Glucose-Capillary: 144 mg/dL — ABNORMAL HIGH (ref 65–99)

## 2015-11-03 LAB — WOUND CULTURE

## 2015-11-03 MED ORDER — INSULIN DETEMIR 100 UNIT/ML ~~LOC~~ SOLN
5.0000 [IU] | Freq: Every day | SUBCUTANEOUS | Status: DC
  Administered 2015-11-03 – 2015-11-10 (×8): 5 [IU] via SUBCUTANEOUS
  Filled 2015-11-03 (×9): qty 0.05

## 2015-11-03 NOTE — Care Management (Signed)
Pt recommending SNF. CSW following

## 2015-11-03 NOTE — Progress Notes (Signed)
[facility] [place] at [facility]                                                                                                                                                                                            Patient Demographics   Daniel [name], is a 64 y.o. male, DOB - 05/12/1951, [id number]  Admit date - 10/30/2015   Admitting Physician Ramonita LabAruna Gouru, MD  Outpatient Primary MD for the patient is [name],[name], MD   LOS - 4  Subjective: Patient admitted with shortness of breath,  right toe erythema and pain, also left sided new eye visual difficulties. Continues to have pain right foot. SOB improved. No CP. Afebrile. Off Oxygen  Angio tomorrow    Review of Systems:   CONSTITUTIONAL: No documented fever. No fatigue, weakness. No weight gain, no weight loss.  EYES: No blurry or double vision. Left eye visual difficulties ENT: No tinnitus. No postnasal drip. No redness of the oropharynx.  RESPIRATORY: No cough, no wheeze, no hemoptysis. Positive dyspnea.  CARDIOVASCULAR: No chest pain. No orthopnea. No palpitations. No syncope.  GASTROINTESTINAL: No nausea, no vomiting or diarrhea. No abdominal pain. No melena or hematochezia.  GENITOURINARY: No dysuria or hematuria.  ENDOCRINE: No polyuria or nocturia. No heat or cold intolerance.  HEMATOLOGY: No anemia. No bruising. No bleeding.  INTEGUMENTARY: No rashes. No lesions.  MUSCULOSKELETAL: No arthritis. No swelling. No gout.  NEUROLOGIC: No numbness, tingling, or ataxia. No seizure-type activity. Generalized weakness PSYCHIATRIC: No anxiety. No insomnia. No ADD.     Vitals:   Filed Vitals:   11/02/15 1933 11/02/15 2130 11/03/15 0410 11/03/15 1148  BP: 121/74 139/82 145/74 123/66  Pulse: 78 80 80 75  Temp: 98.6 F (37 C)   98.2 F (36.8 C)  TempSrc: Oral   Oral  Resp: 20  15 18   Height:      Weight:   78.23 kg (172 lb 7.5 oz)   SpO2: 94%  95% 92%    Wt Readings from Last 3  Encounters:  11/03/15 78.23 kg (172 lb 7.5 oz)  04/27/15 74.844 kg (165 lb)     Intake/Output Summary (Last 24 hours) at 11/03/15 1357 Last data filed at 11/03/15 0800  Gross per 24 hour  Intake    320 ml  Output    400 ml  Net    -80 ml    Physical Exam:   GENERAL: Pleasant-appearing in no apparent distress.  HEAD, EYES, EARS, NOSE AND THROAT: Atraumatic, normocephalic. Extraocular muscles are intact. Pupils equal and reactive to light. Sclerae anicteric. No conjunctival injection. No oro-pharyngeal erythema.  NECK: Supple. There is no jugular venous distention. No bruits, no lymphadenopathy, no thyromegaly.  HEART: Regular rate and rhythm,. No murmurs, no rubs, no clicks.  LUNGS: Bilateral crackles at the bases ABDOMEN: Soft, flat, nontender, nondistended. Has good bowel sounds. No hepatosplenomegaly appreciated.  EXTREMITIES: No evidence of any cyanosis, clubbing, or peripheral edema.  +2 pedal and radial pulses bilaterally.  NEUROLOGIC: The patient is alert, awake, and oriented x3 with no focal motor deficits appreciated bilaterally. Decreased sensations feet SKIN: Right great toe erythematous surrounding cellulitis  Psych: Not anxious, depressed LN: No inguinal LN enlargement Right foot redness with gangrene great toe.   Antibiotics   Anti-infectives    Start     Dose/Rate Route Frequency Ordered Stop   11/02/15 1800  Ampicillin-Sulbactam (UNASYN) 3 g in sodium chloride 0.9 % 100 mL IVPB     3 g 100 mL/hr over 60 Minutes Intravenous Every 6 hours 11/02/15 1522     11/02/15 1400  piperacillin-tazobactam (ZOSYN) IVPB 3.375 g  Status:  Discontinued     3.375 g 12.5 mL/hr over 240 Minutes Intravenous 3 times per day 11/02/15 1350 11/02/15 1444   10/31/15 1130  Ampicillin-Sulbactam (UNASYN) 3 g in sodium chloride 0.9 % 100 mL IVPB  Status:  Discontinued     3 g 100 mL/hr over 60 Minutes Intravenous Every 6 hours 10/31/15 1035 11/02/15 1334   10/31/15 0500  vancomycin  (VANCOCIN) IVPB 1000 mg/200 mL premix  Status:  Discontinued     1,000 mg 200 mL/hr over 60 Minutes Intravenous Every 12 hours 10/30/15 2248 11/02/15 1333   10/30/15 2130  vancomycin (VANCOCIN) IVPB 1000 mg/200 mL premix  Status:  Discontinued     1,000 mg 200 mL/hr over 60 Minutes Intravenous  Once 10/30/15 2120 10/30/15 2141   10/30/15 1645  piperacillin-tazobactam (ZOSYN) IVPB 3.375 g     3.375 g 100 mL/hr over 30 Minutes Intravenous  Once 10/30/15 1633 10/30/15 2019   10/30/15 1645  vancomycin (VANCOCIN) IVPB 1000 mg/200 mL premix     1,000 mg 200 mL/hr over 60 Minutes Intravenous  Once 10/30/15 1633 10/30/15 2019      Medications   Scheduled Meds: . ampicillin-sulbactam (UNASYN) IV  3 g Intravenous Q6H  . aspirin EC  81 mg Oral Daily  . atorvastatin  40 mg Oral QHS  . enoxaparin (LOVENOX) injection  40 mg Subcutaneous Q24H  . furosemide  40 mg Oral BID  . glipiZIDE  10 mg Oral BID AC  . insulin aspart  0-9 Units Subcutaneous TID AC & HS  . insulin detemir  10 Units Subcutaneous QHS  . losartan  100 mg Oral Daily  . metFORMIN  1,000 mg Oral BID WC  . metoprolol tartrate  25 mg Oral BID  . pantoprazole  40 mg Oral QAC breakfast  . pregabalin  75 mg Oral BID   Continuous Infusions:   PRN Meds:.acetaminophen, HYDROmorphone (DILAUDID) injection, nitroGLYCERIN, ondansetron (ZOFRAN) IV, oxyCODONE   Data Review:   Micro Results Recent Results (from the past 240 hour(s))  Culture, blood (routine x 2)     Status: None (Preliminary result)   Collection Time: 10/30/15  4:44 PM  Result Value Ref Range Status   Specimen Description BLOOD LEFT WRIST  Final   Special Requests BOTTLES DRAWN AEROBIC AND ANAEROBIC  2CC  Final   Culture  Setup Time   Final    GRAM POSITIVE COCCI IN CLUSTERS AEROBIC BOTTLE ONLY CRITICAL RESULT CALLED TO, READ BACK BY AND  VERIFIED WITH: JASON ROBBINS AT 1710 ON 10/31/15 CTJ    Culture STAPHYLOCOCCUS AUREUS AEROBIC BOTTLE ONLY   Final   Report  Status PENDING  Incomplete   Organism ID, Bacteria STAPHYLOCOCCUS AUREUS  Final      Susceptibility   Staphylococcus aureus - MIC*    CIPROFLOXACIN <=0.5 SENSITIVE Sensitive     ERYTHROMYCIN 0.5 SENSITIVE Sensitive     GENTAMICIN <=0.5 SENSITIVE Sensitive     OXACILLIN <=0.25 SENSITIVE Sensitive     TRIMETH/SULFA <=10 SENSITIVE Sensitive     CLINDAMYCIN <=0.25 SENSITIVE Sensitive     CEFOXITIN SCREEN NEGATIVE Sensitive     Inducible Clindamycin NEGATIVE Sensitive     TETRACYCLINE Value in next row Sensitive      SENSITIVE<=1    * STAPHYLOCOCCUS AUREUS  Blood Culture ID Panel (Reflexed)     Status: Abnormal   Collection Time: 10/30/15  4:44 PM  Result Value Ref Range Status   Enterococcus species NOT DETECTED NOT DETECTED Final   Listeria monocytogenes NOT DETECTED NOT DETECTED Final   Staphylococcus species DETECTED (A) NOT DETECTED Corrected    Comment: CORRECTED ON 12/27 AT 0801: PREVIOUSLY REPORTED AS NOT DETECTED   Staphylococcus aureus DETECTED (A) NOT DETECTED Final    Comment: CRITICAL RESULT CALLED TO, READ BACK BY AND VERIFIED WITH: JASON ROBBINS AT 1710 ON 10/31/15 CTJ    Streptococcus species NOT DETECTED NOT DETECTED Final   Streptococcus agalactiae NOT DETECTED NOT DETECTED Final   Streptococcus pneumoniae NOT DETECTED NOT DETECTED Final   Streptococcus pyogenes NOT DETECTED NOT DETECTED Final   Acinetobacter baumannii NOT DETECTED NOT DETECTED Final   Enterobacteriaceae species NOT DETECTED NOT DETECTED Final   Enterobacter cloacae complex NOT DETECTED NOT DETECTED Final   Escherichia coli NOT DETECTED NOT DETECTED Final   Klebsiella oxytoca NOT DETECTED NOT DETECTED Final   Klebsiella pneumoniae NOT DETECTED NOT DETECTED Final   Proteus species NOT DETECTED NOT DETECTED Final   Serratia marcescens NOT DETECTED NOT DETECTED Final   Haemophilus influenzae NOT DETECTED NOT DETECTED Final   Neisseria meningitidis NOT DETECTED NOT DETECTED Final   Pseudomonas  aeruginosa NOT DETECTED NOT DETECTED Final   Candida albicans NOT DETECTED NOT DETECTED Final   Candida glabrata NOT DETECTED NOT DETECTED Final   Candida krusei NOT DETECTED NOT DETECTED Final   Candida parapsilosis NOT DETECTED NOT DETECTED Final   Candida tropicalis NOT DETECTED NOT DETECTED Final   Carbapenem resistance NOT DETECTED NOT DETECTED Final   Methicillin resistance NOT DETECTED NOT DETECTED Final   Vancomycin resistance NOT DETECTED NOT DETECTED Final  Culture, blood (routine x 2)     Status: None   Collection Time: 10/30/15  5:31 PM  Result Value Ref Range Status   Specimen Description BLOOD LEFT HAND  Final   Special Requests BOTTLES DRAWN AEROBIC AND ANAEROBIC  2CC  Final   Culture  Setup Time   Final    GRAM POSITIVE COCCI IN CHAINS IN BOTH AEROBIC AND ANAEROBIC BOTTLES CRITICAL RESULT CALLED TO, READ BACK BY AND VERIFIED WITH: KAREN HAYES AT 1045 10/31/15 CTJ    Culture   Final    VIRIDANS STREPTOCOCCUS IN BOTH AEROBIC AND ANAEROBIC BOTTLES MULTIPLE SPECIES PRESENT Results consistent with contamination.    Report Status 11/02/2015 FINAL  Final  Blood Culture ID Panel (Reflexed)     Status: Abnormal   Collection Time: 10/30/15  5:31 PM  Result Value Ref Range Status   Enterococcus species NOT DETECTED NOT DETECTED Final  Listeria monocytogenes NOT DETECTED NOT DETECTED Final   Staphylococcus species NOT DETECTED NOT DETECTED Final   Staphylococcus aureus NOT DETECTED NOT DETECTED Final   Streptococcus species DETECTED (A) NOT DETECTED Final    Comment: CRITICAL RESULT CALLED TO, READ BACK BY AND VERIFIED WITH: KAREN HAYES AT 1045 ON 10/31/15 CTJ    Streptococcus agalactiae NOT DETECTED NOT DETECTED Final   Streptococcus pneumoniae NOT DETECTED NOT DETECTED Final   Streptococcus pyogenes NOT DETECTED NOT DETECTED Final   Acinetobacter baumannii NOT DETECTED NOT DETECTED Final   Enterobacteriaceae species NOT DETECTED NOT DETECTED Final   Enterobacter  cloacae complex NOT DETECTED NOT DETECTED Final   Escherichia coli NOT DETECTED NOT DETECTED Final   Klebsiella oxytoca NOT DETECTED NOT DETECTED Final   Klebsiella pneumoniae NOT DETECTED NOT DETECTED Final   Proteus species NOT DETECTED NOT DETECTED Final   Serratia marcescens NOT DETECTED NOT DETECTED Final   Haemophilus influenzae NOT DETECTED NOT DETECTED Final   Neisseria meningitidis NOT DETECTED NOT DETECTED Final   Pseudomonas aeruginosa NOT DETECTED NOT DETECTED Final   Candida albicans NOT DETECTED NOT DETECTED Final   Candida glabrata NOT DETECTED NOT DETECTED Final   Candida krusei NOT DETECTED NOT DETECTED Final   Candida parapsilosis NOT DETECTED NOT DETECTED Final   Candida tropicalis NOT DETECTED NOT DETECTED Final   Carbapenem resistance NOT DETECTED NOT DETECTED Final   Methicillin resistance NOT DETECTED NOT DETECTED Final   Vancomycin resistance NOT DETECTED NOT DETECTED Final  CULTURE, BLOOD (ROUTINE X 2) w Reflex to PCR ID Panel     Status: None (Preliminary result)   Collection Time: 10/31/15 11:35 AM  Result Value Ref Range Status   Specimen Description BLOOD RIGHT HAND  Final   Special Requests BOTTLES DRAWN AEROBIC AND ANAEROBIC 2CC  Final   Culture NO GROWTH 3 DAYS  Final   Report Status PENDING  Incomplete  CULTURE, BLOOD (ROUTINE X 2) w Reflex to PCR ID Panel     Status: None (Preliminary result)   Collection Time: 10/31/15 11:35 AM  Result Value Ref Range Status   Specimen Description BLOOD RIGHT ASSIST CONTROL  Final   Special Requests BOTTLES DRAWN AEROBIC AND ANAEROBIC 2CC  Final   Culture NO GROWTH 3 DAYS  Final   Report Status PENDING  Incomplete  Wound culture     Status: None   Collection Time: 10/31/15  2:12 PM  Result Value Ref Range Status   Specimen Description ULCER  Final   Special Requests NONE  Final   Gram Stain   Final    RARE WBC SEEN MODERATE GRAM POSITIVE COCCI IN PAIRS IN CLUSTERS RARE GRAM NEGATIVE RODS RARE GRAM POSITIVE  RODS    Culture   Final    MODERATE GROWTH PROVIDENCIA RETTGERI HEAVY GROWTH STAPHYLOCOCCUS AUREUS    Report Status 11/03/2015 FINAL  Final   Organism ID, Bacteria PROVIDENCIA RETTGERI  Final   Organism ID, Bacteria STAPHYLOCOCCUS AUREUS  Final      Susceptibility   Staphylococcus aureus - MIC*    CIPROFLOXACIN <=0.5 SENSITIVE Sensitive     ERYTHROMYCIN <=0.25 SENSITIVE Sensitive     GENTAMICIN <=0.5 SENSITIVE Sensitive     OXACILLIN <=0.25 SENSITIVE Sensitive     TRIMETH/SULFA <=10 SENSITIVE Sensitive     CLINDAMYCIN <=0.25 SENSITIVE Sensitive     CEFOXITIN SCREEN NEGATIVE Sensitive     Inducible Clindamycin NEGATIVE Sensitive     TETRACYCLINE Value in next row Sensitive      SENSITIVE<=1    *  HEAVY GROWTH STAPHYLOCOCCUS AUREUS   Providencia rettgeri - MIC*    AMPICILLIN Value in next row Resistant      SENSITIVE<=1    CEFTAZIDIME Value in next row Sensitive      SENSITIVE<=1    CEFAZOLIN Value in next row Resistant      SENSITIVE<=1    CEFTRIAXONE Value in next row Sensitive      SENSITIVE<=1    CIPROFLOXACIN Value in next row Sensitive      SENSITIVE<=1    GENTAMICIN Value in next row Sensitive      SENSITIVE<=1    IMIPENEM Value in next row Sensitive      SENSITIVE<=1    TRIMETH/SULFA Value in next row Resistant      SENSITIVE<=1    PIP/TAZO Value in next row Sensitive      SENSITIVE<=4    AMPICILLIN/SULBACTAM Value in next row Sensitive      SENSITIVE<=2    * MODERATE GROWTH PROVIDENCIA RETTGERI  Culture, blood (Routine X 2) w Reflex to ID Panel     Status: None (Preliminary result)   Collection Time: 11/01/15  5:07 PM  Result Value Ref Range Status   Specimen Description BLOOD RIGHT ASSIST CONTROL  Final   Special Requests BOTTLES DRAWN AEROBIC AND ANAEROBIC 5CC  Final   Culture NO GROWTH 2 DAYS  Final   Report Status PENDING  Incomplete  Culture, blood (Routine X 2) w Reflex to ID Panel     Status: None (Preliminary result)   Collection Time: 11/01/15  5:07  PM  Result Value Ref Range Status   Specimen Description BLOOD RIGHT HAND  Final   Special Requests BOTTLES DRAWN AEROBIC AND ANAEROBIC 3CC  Final   Culture NO GROWTH 2 DAYS  Final   Report Status PENDING  Incomplete    Radiology Reports Ct Head Wo Contrast  10/30/2015  CLINICAL DATA:  Generalized weakness, diabetes, hypertension, right visual disturbance EXAM: CT HEAD WITHOUT CONTRAST TECHNIQUE: Contiguous axial images were obtained from the base of the skull through the vertex without contrast. COMPARISON:  03/01/2011 FINDINGS: Mild brain atrophy and chronic white matter microvascular ischemic changes throughout the periventricular white matter. These changes have slightly progressed. No acute intracranial hemorrhage, mass lesion, definite infarction, midline shift, herniation, hydrocephalus, or extra-axial fluid collection. Cisterns patent. No cerebellar abnormality. Skull intact. Mastoids and sinuses remain clear. Atherosclerosis of the intracranial vessels. IMPRESSION: Atrophy and slight progression of chronic white matter microvascular ischemic changes. No acute intracranial process by noncontrast CT. Electronically Signed   By: Judie Petit.  Shick M.D.   On: 10/30/2015 18:15   Mr Laqueta Jean ZO Contrast  11/01/2015  CLINICAL DATA:  64 year old male with progressive lower extremity weakness for 6 months. Initial encounter. Diabetes EXAM: MRI HEAD WITHOUT AND WITH CONTRAST TECHNIQUE: Multiplanar, multiecho pulse sequences of the brain and surrounding structures were obtained without and with intravenous contrast. CONTRAST:  16mL MULTIHANCE GADOBENATE DIMEGLUMINE 529 MG/ML IV SOLN COMPARISON:  Head CT without contrast 10/30/2015. [facility] postoperative cervical spine radiograph 07/19/2011. FINDINGS: Major intracranial vascular flow voids are preserved. Late subacute to early chronic appearing right corona radiata 10 mm focus of diffusion signal change (series 100, image 36) with T2  shine through. No restricted diffusion or evidence of acute infarction. Additional patchy bilateral cerebral white matter T2 and FLAIR hyperintensity. Chronic lacunar infarct in the *SCRATCH* SPECT chronic lacunar infarcts in the right thalamus. Mild for age patchy T2 hyperintensity in the pons, but with associated pontine  chronic micro hemorrhage (series 9, image 8). No cortical encephalomalacia. No midline shift, mass effect, evidence of mass lesion, ventriculomegaly, extra-axial collection or acute intracranial hemorrhage. Cervicomedullary junction and pituitary are within normal limits. Negative visualized cervical spine aside from hardware susceptibility artifact. No abnormal enhancement identified. Incidental anterior right frontal lobe developmental venous anomaly (normal anatomic variant). Visible internal auditory structures appear normal. Mastoids and paranasal sinuses are clear. Postoperative changes to the globes. Otherwise negative scalp and orbits soft tissues. Normal bone marrow signal. IMPRESSION: No acute intracranial abnormality. Moderate for age chronic small vessel disease with late subacute lacune in the right corona radiata. Electronically Signed   By: Odessa Fleming M.D.   On: 11/01/2015 10:19   Mr Foot Right Wo Contrast  11/01/2015  CLINICAL DATA:  Ulceration in a about 7 mm along the medial right great toe with purulence noted. Erythema extends dorsally in the foot. EXAM: MRI OF THE RIGHT FOREFOOT WITHOUT CONTRAST TECHNIQUE: Multiplanar, multisequence MR imaging was performed. No intravenous contrast was administered. COMPARISON:  10/30/2015 FINDINGS: There is subcutaneous edema diffusely in the foot especially dorsally, and extending into the ankle both medially and laterally. Low-level edema extends into the toes. On image 32 series 4 there is a defect in the skin and subcutaneous tissues medially along the great toe approximately at the level of the distal phalanx. The on images 23-25 of  series 7 there seems to be abnormal increased inversion recovery weighted signal in the base of the distal phalanx suspicious for early osteomyelitis given the immediately adjacent ulceration. Sharply defined erosions or cystic lesions are present along the Lisfranc joint is, including adjacent to the expected attachment site of the Lisfranc ligament to the second metatarsal base. However, there is no overt malalignment at the Lisfranc joint. Erosions are present at the articulation of the navicular and medial cuneiform. There is thickening of the medial band of the plantar fascia proximally. IMPRESSION: 1. Ulceration medially along the great toe, with underlying edema signal in the marrow of the base of the distal phalanx suspicious for early osteomyelitis. No other osteomyelitis identified. 2. Cystic or erosive lesions along significant portions of the Lisfranc joint and at the articulation of the distal navicular with the medial cuneiform, suspicious for erosive arthropathy such as rheumatoid arthropathy. An alternative would be early Charcot joint. 3. Edema tracking throughout the foot and ankle, especially dorsally, potentially from cellulitis. Electronically Signed   By: Gaylyn Rong M.D.   On: 11/01/2015 10:37   Dg Chest Port 1 View  10/30/2015  CLINICAL DATA:  Generalized weakness and elevated blood glucose today. EXAM: PORTABLE CHEST 1 VIEW COMPARISON:  CT chest 04/27/2015. PA and lateral chest 04/27/2015 and 01/22/2015. FINDINGS: Heart size is normal. The lungs are clear. No pneumothorax or pleural effusion. IMPRESSION: No acute disease. Electronically Signed   By: Drusilla Kanner M.D.   On: 10/30/2015 15:14   Dg Toe Great Right  10/30/2015  CLINICAL DATA:  Right toe swelling for 2 weeks.  Diabetic patient. EXAM: RIGHT GREAT TOE COMPARISON:  None. FINDINGS: There is no evidence of fracture or dislocation. There is no evidence of cortical erosions to suggest osteomyelitis radiographically.  Heterogeneous appearance of the bony matrix of the first phalanx is noted, with uncertain significance. There is soft tissue swelling of the dorsal and palmar aspect of the mid and forefoot. IMPRESSION: No evidence of fracture or subluxation. No definitive radiographic evidence of osteomyelitis. Heterogeneous appearance of the bony matrix of the first digit with uncertain significance.  Electronically Signed   By: Ted Mcalpine M.D.   On: 10/30/2015 13:51     CBC  Recent Labs Lab 10/30/15 1732 10/31/15 0540 11/01/15 0247  WBC 12.0* 10.3 10.2  HGB 13.5 12.5* 12.1*  HCT 40.3 36.8* 36.1*  PLT 207 184 205  MCV 90.8 89.0 89.0  MCH 30.4 30.2 29.8  MCHC 33.5 34.0 33.5  RDW 14.2 14.5 14.5  LYMPHSABS 0.9* 1.4  --   MONOABS 1.1* 1.3*  --   EOSABS 0.0 0.0  --   BASOSABS 0.0 0.0  --     Chemistries   Recent Labs Lab 10/30/15 1424 10/31/15 0540 11/01/15 0247 11/02/15 0549  NA 139 138 137  --   K 3.6 3.8 3.5  --   CL 99* 101 99*  --   CO2 --   GLUCOSE 395* 215* 187*  --   BUN 18 27* 41*  --   CREATININE 0.91 1.49* 1.34* 1.21  CALCIUM 9.0 8.1* 8.0*  --   AST 23  --   --   --   ALT 17  --   --   --   ALKPHOS 97  --   --   --   BILITOT 1.3*  --   --   --    ------------------------------------------------------------------------------------------------------------------ estimated creatinine clearance is 60.5 mL/min (by C-G formula based on Cr of 1.21). ------------------------------------------------------------------------------------------------------------------ No results for input(s): HGBA1C in the last 72 hours. ------------------------------------------------------------------------------------------------------------------ No results for input(s): CHOL, HDL, LDLCALC, TRIG, CHOLHDL, LDLDIRECT in the last 72 hours. ------------------------------------------------------------------------------------------------------------------ No results for input(s): TSH,  T4TOTAL, T3FREE, THYROIDAB in the last 72 hours.  Invalid input(s): FREET3 ------------------------------------------------------------------------------------------------------------------ No results for input(s): VITAMINB12, FOLATE, FERRITIN, TIBC, IRON, RETICCTPCT in the last 72 hours.  Coagulation profile  Recent Labs Lab 10/30/15 1331 10/31/15 0540  INR 1.10 1.23    No results for input(s): DDIMER in the last 72 hours.  Cardiac Enzymes  Recent Labs Lab 10/30/15 2253 10/31/15 0540 10/31/15 1135  TROPONINI 0.66* 0.82* 0.71*   ------------------------------------------------------------------------------------------------------------------ Invalid input(s): POCBNP    Assessment & Plan   # PAD Angiogram on 11/04/2015.  # Elevated troponin likely from CHF and Infection. Stop heparin. No further workup advised by cardiology. ASA, Statin, BB.  # Acute hypoxic respiratory failure secondary to acute on chronic systolic chf - EF 20-25% Change lasix to PO Monitor daily weights and intake and output Continue aspirin, beta blocker and statin  # Right great toe and foot cellulitis with osteomyelitis and bacteremia Staph bacteremia and Providencia in wound. 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 Shaune Pollack has discussed with on-call ophthalmologist Dr. Duke Salvia, who has recommended outpatient follow-up after discharge  # Diabetes mellitus Sliding scale insulin blood sugars elevated. On glipizide. Added metformin and Levemir     Code Status Orders        Start     Ordered   10/30/15 2249  Full code   Continuous     10/30/15 2248    Advance Directive Documentation        Most Recent Value   Type of Advance Directive  Healthcare Power of Attorney   Pre-existing out of facility DNR order (yellow form or pink MOST form)     "MOST" Form  in Place?        Consults  Cardiology  DVT Prophylaxis  heparin  Lab Results  Component Value Date   PLT 205 11/01/2015     Time Spent in minutes   35 minutes  Milagros Loll R M.D on 11/03/2015 at 1:57 PM  Between 7am to 6pm - Pager - [phone]  After 6pm go to [email or web address] - password [email or web address] [facility]  [facility]   Office  [phone]

## 2015-11-03 NOTE — Progress Notes (Signed)
   11/03/15 1430  Clinical Encounter Type  Visited With Patient and family together  Visit Type Follow-up  Referral From Nurse  Consult/Referral To Chaplain  Spiritual Encounters  Spiritual Needs Literature  Stress Factors  Patient Stress Factors Exhausted;Health changes  Advance Directives (For Healthcare)  Does patient have an advance directive? Yes  Would patient like information on creating an advanced directive? Yes - Scientist, clinical (histocompatibility and immunogenetics) given  Type of Academic librarian  Does patient want to make changes to advanced directive? Yes - information given  Copy of advanced directive(s) in chart? Yes  Met w/patient & brother to complete HCPOA. Provided copy to staff for inclusion in EPIC. Chap. Maelynn Moroney  G. [place]

## 2015-11-03 NOTE — Care Management Important Message (Signed)
Important Message  Patient Details  Name: Daniel [name] MRN: [id number] Date of Birth: 02/25/1951   Medicare Important Message Given:  Yes    Olegario MessierKathy A Porcia Morganti 11/03/2015, 11:33 AM

## 2015-11-03 NOTE — Progress Notes (Addendum)
CSW met with patient and brother Jori Moll) at bedside. CSW made patient and brother aware of bed offers. Patient and brother agreed on the [facility] bed offer. CSW contacted Maudie Mercury, admissions coordinator with [name] to inform her of above. Per MD patient will likely not be ready for D/C over the weekend. CSW contacted [place] and made her aware of above. CSW will continue to follow and assist as needed.  Ernest Pine, MSW, [place] Social Work Department [phone]

## 2015-11-03 NOTE — Anesthesia Preprocedure Evaluation (Deleted)
Anesthesia Evaluation  Patient identified by MRN, date of birth, ID band Patient awake    Reviewed: Allergy & Precautions, NPO status , Patient's Chart, lab work & pertinent test results, reviewed documented beta blocker date and time   Airway Mallampati: II  TM Distance: >3 FB     Dental  (+) Chipped   Pulmonary           Cardiovascular hypertension, Pt. on medications and Pt. on home beta blockers + angina + CAD and + Past MI       Neuro/Psych    GI/Hepatic   Endo/Other  diabetes  Renal/GU      Musculoskeletal   Abdominal   Peds  Hematology   Anesthesia Other Findings   Reproductive/Obstetrics                             Anesthesia Physical Anesthesia Plan  ASA: III  Anesthesia Plan: General   Post-op Pain Management:    Induction: Intravenous  Airway Management Planned: Oral ETT and LMA  Additional Equipment:   Intra-op Plan:   Post-operative Plan:   Informed Consent: I have reviewed the patients History and Physical, chart, labs and discussed the procedure including the risks, benefits and alternatives for the proposed anesthesia with the patient or authorized representative who has indicated his/her understanding and acceptance.     Plan Discussed with: CRNA  Anesthesia Plan Comments:         Anesthesia Quick Evaluation

## 2015-11-03 NOTE — Progress Notes (Signed)
Inpatient Diabetes Program Recommendations  AACE/ADA: New Consensus Statement on Inpatient Glycemic Control (2015)  Target Ranges:  Prepandial:   less than 140 mg/dL      Peak postprandial:   less than 180 mg/dL (1-2 hours)      Critically ill patients:  140 - 180 mg/dL   Review of Glycemic Control  Results for Daniel [name], Daniel [name] (MRN [id number]) as of 11/03/2015 07:57  Ref. Range 11/02/2015 07:59 11/02/2015 11:27 11/02/2015 16:18 11/02/2015 21:19 11/03/2015 07:27  Glucose-Capillary Latest Ref Range: 65-99 mg/dL 782160 (H) 956374 (H) 213144 (H) 211 (H) 81   Diabetes history: DM2 Outpatient Diabetes medications: Glipizide 10 mg BID, Metformin 1000 mg BID, Actos 30 mg daily Current orders for Inpatient glycemic control: Glipizide 10 mg BID, Novolog 0-9 units ACHS, Metformin 1000mg  bid, Levemir 10 units qhs  Inpatient Diabetes Program Recommendations: Consider changing hs correction insulin from Novolog 0-9 units qhs to Novolog 0-5 units qhs  Susette RacerJulie Cerrone Debold, RN, [place], [place]HA, CDE Diabetes Coordinator Inpatient Diabetes Program  [phone] (Team Pager) [phone] [facility] Office) 11/03/2015 8:00 AM

## 2015-11-03 NOTE — Anesthesia Preprocedure Evaluation (Addendum)
Anesthesia Evaluation  Patient identified by MRN, date of birth, ID band Patient awake    Reviewed: Allergy & Precautions, NPO status , Patient's Chart, lab work & pertinent test results, reviewed documented beta blocker date and time   Airway Mallampati: II  TM Distance: >3 FB     Dental  (+) Lower Dentures, Upper Dentures   Pulmonary           Cardiovascular hypertension, Pt. on medications and Pt. on home beta blockers + angina + CAD and + Past MI       Neuro/Psych PSYCHIATRIC DISORDERS Depression    GI/Hepatic   Endo/Other  diabetes, Type 2  Renal/GU Renal InsufficiencyRenal disease     Musculoskeletal   Abdominal   Peds  Hematology   Anesthesia Other Findings No angina at present. Peripheral neuropathy. Cardiac stent 2009. Cervical laminectomy, but good neck movement.EKG noterd Echo shows EF 25%.  Reproductive/Obstetrics                           Anesthesia Physical Anesthesia Plan  ASA: III  Anesthesia Plan: General   Post-op Pain Management:    Induction: Intravenous  Airway Management Planned: Nasal Cannula and Simple Face Mask  Additional Equipment:   Intra-op Plan:   Post-operative Plan:   Informed Consent: I have reviewed the patients History and Physical, chart, labs and discussed the procedure including the risks, benefits and alternatives for the proposed anesthesia with the patient or authorized representative who has indicated his/her understanding and acceptance.     Plan Discussed with: CRNA  Anesthesia Plan Comments:        Anesthesia Quick Evaluation

## 2015-11-03 NOTE — Progress Notes (Signed)
[facility]  Daily Progress Note   Subjective  -patient reports his pain is no better perhaps even worse he feels the erythema is increased  Objective Filed Vitals:   11/02/15 2130 11/03/15 0410 11/03/15 1148 11/03/15 1955  BP: 139/82 145/74 123/66 149/87  Pulse: 80 80 75 83  Temp:   98.2 F (36.8 C) 98 F (36.7 C)  TempSrc:   Oral   Resp:  Height:      Weight:  78.23 kg (172 lb 7.5 oz)    SpO2:  95% 92% 96%    Intake/Output Summary (Last 24 hours) at 11/03/15 2002 Last data filed at 11/03/15 1130  Gross per 24 hour  Intake    460 ml  Output      0 ml  Net    460 ml    PULM  Normal effort , no use of accessory muscles CV  No JVD, RRR Abd      No distended, nontender VASC  erythema of the forefoot is about the same as it was yesterday great toe is dusky pedal pulses are nonpalpable edema is 3+ pitting  Laboratory CBC    Component Value Date/Time   WBC 10.2 11/01/2015 0247   WBC 7.8 09/01/2014 0904   HGB 12.1* 11/01/2015 0247   HGB 16.1 09/01/2014 0904   HCT 36.1* 11/01/2015 0247   HCT 48.6 09/01/2014 0904   PLT 205 11/01/2015 0247   PLT 188 09/01/2014 0904    BMET    Component Value Date/Time   NA 137 11/01/2015 0247   NA 137 09/01/2014 0904   K 3.5 11/01/2015 0247   K 3.9 09/01/2014 0904   CL 99* 11/01/2015 0247   CL 103 09/01/2014 0904   CO2 26 11/01/2015 0247   CO2 24 09/01/2014 0904   GLUCOSE 187* 11/01/2015 0247   GLUCOSE 321* 09/01/2014 0904   BUN 41* 11/01/2015 0247   BUN 15 09/01/2014 0904   CREATININE 1.21 11/02/2015 0549   CREATININE 0.96 09/01/2014 0904   CALCIUM 8.0* 11/01/2015 0247   CALCIUM 8.1* 09/01/2014 0904   GFRNONAA >60 11/02/2015 0549   GFRNONAA >60 09/01/2014 0904   GFRNONAA 58* 04/23/2012 1940   GFRAA >60 11/02/2015 0549   GFRAA >60 09/01/2014 0904   GFRAA >60 04/23/2012 1940    Assessment/Planning: 1. Atherosclerotic occlusive disease bilateral lower extremities with ulceration of the  right great toe. I do believe the patient will need angiography with the hope for intervention for limb salvage. At this point I would continue to treat with antibiotics in hopes that his cellulitis improves I also acknowledge he may need for foot surgery based on the evaluation from the foot and ankle service. At this point I will plan for angiography on Friday, December 30.  2. Right great toe cellulitis Provide IV antibiotics podiatry on consult I will plan I&D tomorrow  3. Unstable angina/non-STEMI Admit to telemetry and patient is on heparin drip Cycle cardiac biomarkers Nothing by mouth after midnight Consult is placed to [facility] cardiology and discussed with Dr. Juliann Pares he is aware Provide nitroglycerin as needed for chest pain Continue aspirin, beta blocker and statin  4. Acute hypoxic respiratory failure secondary to acute exacerbation of CHF/unstable angina Lasix IV every 12 hours Monitor daily weights and intake and output Get echocardiogram Continue aspirin, beta blocker and statin  5. Progressively worsening bilateral lower extremity weakness-. Unlikely stroke CT head is negative Continue aspirin and statin and get MRI  of the brain and echocardiogram Neurology consult is placed  6. Diabetes mellitus Sliding scale insulin  7. 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 Shaune PollackLord has discussed with on-call ophthalmologist Dr. Duke Salviaandolph, who has recommended outpatient follow-up after discharge     Renford DillsSchnier, Gregory G  11/03/2015, 8:02 PM

## 2015-11-03 NOTE — Progress Notes (Signed)
   11/03/15 1318  Clinical Encounter Type  Visited With Patient  Visit Type Initial  Referral From Nurse  Consult/Referral To Chaplain  Chaplain took patient education on advanced directive and will await for call to have document completed.  Fisher ScientificChaplain Haydin Dunn [phone]

## 2015-11-03 NOTE — Progress Notes (Signed)
Daily Progress Note   Subjective  - * No surgery found *    Follow-up of right great toe ulceration with cellulitis.  He still complaining of pain associated with this.  Objective Filed Vitals:   11/02/15 1933 11/02/15 2130 11/03/15 0410 11/03/15 1148  BP: 121/74 139/82 145/74 123/66  Pulse: 78 80 80 75  Temp: 98.6 F (37 C)   98.2 F (36.8 C)  TempSrc: Oral   Oral  Resp: 20  15 18   Height:      Weight:   78.23 kg (172 lb 7.5 oz)   SpO2: 94%  95% 92%    Physical Exam:   He does have some worsening cellulitis to the dorsal aspect of the right foot.  The ulcerative site on the medial aspect at the interphalangeal joint of the right great toe is noted.  I was able to remove some of the fibrotic and hemorrhagic tissue.  It does probe to the plantar aspect of the right great toe.  No severe purulence was noted.  I did not detect an abscess to the area but I am quite concerned of worsening infection and possible tracking up the tendon sheath either dorsally or plantarly.  The distal 1/2 of the great toe does have a dusky appearance as well.  Laboratory CBC    Component Value Date/Time   WBC 10.2 11/01/2015 0247   WBC 7.8 09/01/2014 0904   HGB 12.1* 11/01/2015 0247   HGB 16.1 09/01/2014 0904   HCT 36.1* 11/01/2015 0247   HCT 48.6 09/01/2014 0904   PLT 205 11/01/2015 0247   PLT 188 09/01/2014 0904    BMET    Component Value Date/Time   NA 137 11/01/2015 0247   NA 137 09/01/2014 0904   K 3.5 11/01/2015 0247   K 3.9 09/01/2014 0904   CL 99* 11/01/2015 0247   CL 103 09/01/2014 0904   CO2 26 11/01/2015 0247   CO2 24 09/01/2014 0904   GLUCOSE 187* 11/01/2015 0247   GLUCOSE 321* 09/01/2014 0904   BUN 41* 11/01/2015 0247   BUN 15 09/01/2014 0904   CREATININE 1.21 11/02/2015 0549   CREATININE 0.96 09/01/2014 0904   CALCIUM 8.0* 11/01/2015 0247   CALCIUM 8.1* 09/01/2014 0904   GFRNONAA >60 11/02/2015 0549   GFRNONAA >60 09/01/2014 0904   GFRNONAA 58* 04/23/2012 1940   GFRAA >60 11/02/2015 0549   GFRAA >60 09/01/2014 0904   GFRAA >60 04/23/2012 1940    Assessment/Planning:   Worsening cellulitis right great toe with osteomyelitis on MRI.     He likely needs debridement of this right great toe to tried a get the infection under control.  He may have tracking through the flexor tendon sheath.  He is scheduled for vascular procedure tomorrow and I discussed this with vascular surgery and they will perform a local I and D to the area after revascularization has been performed.  We can continue to monitor and follow up.    Gwyneth RevelsFowler, Karin Griffith A  11/03/2015, 6:01 PM

## 2015-11-04 ENCOUNTER — Encounter
Admission: RE | Admit: 2015-11-04 | Discharge: 2015-11-04 | Disposition: A | Discharge status: Home / Self Care | Payer: Medicare Other | Source: Ambulatory Visit | Attending: Internal Medicine | Admitting: Internal Medicine

## 2015-11-04 ENCOUNTER — Encounter
Admission: EM | Disposition: A | Discharge status: Skilled Nursing Facility | Payer: Self-pay | Source: Home / Self Care | Attending: Internal Medicine

## 2015-11-04 ENCOUNTER — Inpatient Hospital Stay: Payer: Commercial Managed Care - HMO | Admitting: Anesthesiology

## 2015-11-04 HISTORY — PX: PERIPHERAL VASCULAR CATHETERIZATION: SHX172C

## 2015-11-04 HISTORY — PX: WOUND DEBRIDEMENT: SHX247

## 2015-11-04 LAB — CULTURE, BLOOD (ROUTINE X 2)

## 2015-11-04 LAB — GLUCOSE, CAPILLARY
GLUCOSE-CAPILLARY: 79 mg/dL (ref 65–99)
GLUCOSE-CAPILLARY: 134 mg/dL — AB (ref 65–99)
Glucose-Capillary: 80 mg/dL (ref 65–99)
Glucose-Capillary: 65 mg/dL (ref 65–99)
Glucose-Capillary: 120 mg/dL — ABNORMAL HIGH (ref 65–99)
Glucose-Capillary: 133 mg/dL — ABNORMAL HIGH (ref 65–99)

## 2015-11-04 SURGERY — DEBRIDEMENT, WOUND
Anesthesia: General | Site: Foot | Laterality: Right | Wound class: Contaminated

## 2015-11-04 SURGERY — LOWER EXTREMITY ANGIOGRAPHY
Anesthesia: Moderate Sedation

## 2015-11-04 MED ORDER — HEPARIN SODIUM (PORCINE) 1000 UNIT/ML IJ SOLN
INTRAMUSCULAR | Status: DC | PRN
  Administered 2015-11-04: 4000 [IU] via INTRAVENOUS

## 2015-11-04 MED ORDER — MIDAZOLAM HCL 5 MG/5ML IJ SOLN
INTRAMUSCULAR | Status: AC
  Filled 2015-11-04: qty 5

## 2015-11-04 MED ORDER — GLYCOPYRROLATE 0.2 MG/ML IJ SOLN
INTRAMUSCULAR | Status: DC | PRN
  Administered 2015-11-04: 0.2 mg via INTRAVENOUS

## 2015-11-04 MED ORDER — HEPARIN (PORCINE) IN NACL 2-0.9 UNIT/ML-% IJ SOLN
INTRAMUSCULAR | Status: AC
  Filled 2015-11-04: qty 1000

## 2015-11-04 MED ORDER — HYDROMORPHONE HCL 1 MG/ML IJ SOLN
INTRAMUSCULAR | Status: AC
  Filled 2015-11-04: qty 1

## 2015-11-04 MED ORDER — CEFUROXIME SODIUM 1.5 G IJ SOLR
INTRAMUSCULAR | Status: AC
  Filled 2015-11-04 (×12): qty 1.5

## 2015-11-04 MED ORDER — DEXTROSE 50 % IV SOLN
INTRAVENOUS | Status: AC
  Filled 2015-11-04: qty 50

## 2015-11-04 MED ORDER — LIDOCAINE HCL (PF) 1 % IJ SOLN
INTRAMUSCULAR | Status: AC
  Filled 2015-11-04: qty 10

## 2015-11-04 MED ORDER — SILVER SULFADIAZINE 1 % EX CREA
TOPICAL_CREAM | CUTANEOUS | Status: DC | PRN
  Administered 2015-11-04: 1 via TOPICAL

## 2015-11-04 MED ORDER — FENTANYL CITRATE (PF) 100 MCG/2ML IJ SOLN
INTRAMUSCULAR | Status: AC
  Filled 2015-11-04: qty 2

## 2015-11-04 MED ORDER — HEPARIN SODIUM (PORCINE) 1000 UNIT/ML IJ SOLN
INTRAMUSCULAR | Status: AC
  Filled 2015-11-04: qty 1

## 2015-11-04 MED ORDER — LIDOCAINE HCL 1 % IJ SOLN
INTRAMUSCULAR | Status: DC | PRN
  Administered 2015-11-04: 5 mL

## 2015-11-04 MED ORDER — KETAMINE HCL 10 MG/ML IJ SOLN
INTRAMUSCULAR | Status: DC | PRN
  Administered 2015-11-04: 25 mg via INTRAVENOUS

## 2015-11-04 MED ORDER — LACTATED RINGERS IV SOLN
INTRAVENOUS | Status: DC | PRN
  Administered 2015-11-04: 19:00:00 via INTRAVENOUS

## 2015-11-04 MED ORDER — SILVER SULFADIAZINE 1 % EX CREA
TOPICAL_CREAM | Freq: Once | CUTANEOUS | Status: AC
  Administered 2015-11-04: 23:00:00 via TOPICAL
  Filled 2015-11-04: qty 25

## 2015-11-04 MED ORDER — SODIUM CHLORIDE 0.9 % IV SOLN
Freq: Once | INTRAVENOUS | Status: AC
  Administered 2015-11-04: 12:00:00 via INTRAVENOUS

## 2015-11-04 MED ORDER — BUPIVACAINE HCL (PF) 0.5 % IJ SOLN
INTRAMUSCULAR | Status: AC
  Filled 2015-11-04: qty 30

## 2015-11-04 MED ORDER — FENTANYL CITRATE (PF) 100 MCG/2ML IJ SOLN: 25.0000 ug | INTRAMUSCULAR | Status: DC | PRN

## 2015-11-04 MED ORDER — SILVER SULFADIAZINE 1 % EX CREA
TOPICAL_CREAM | Freq: Once | CUTANEOUS | Status: DC
  Filled 2015-11-04: qty 85

## 2015-11-04 MED ORDER — PROPOFOL 500 MG/50ML IV EMUL
INTRAVENOUS | Status: DC | PRN
  Administered 2015-11-04: 50 ug/kg/min via INTRAVENOUS

## 2015-11-04 MED ORDER — DEXTROSE 50 % IV SOLN
1.0000 | Freq: Once | INTRAVENOUS | Status: AC
  Administered 2015-11-04: 50 mL via INTRAVENOUS

## 2015-11-04 MED ORDER — BUPIVACAINE HCL (PF) 0.5 % IJ SOLN
INTRAMUSCULAR | Status: DC | PRN
Start: 2015-11-04 — End: 2015-11-04
  Administered 2015-11-04: 8 mL

## 2015-11-04 MED ORDER — FENTANYL CITRATE (PF) 100 MCG/2ML IJ SOLN
INTRAMUSCULAR | Status: DC | PRN
  Administered 2015-11-04: 50 ug via INTRAVENOUS
  Administered 2015-11-04: 100 ug via INTRAVENOUS
  Administered 2015-11-04: 50 ug via INTRAVENOUS

## 2015-11-04 MED ORDER — IOHEXOL 300 MG/ML  SOLN
INTRAMUSCULAR | Status: DC | PRN
  Administered 2015-11-04: 75 mL via INTRA_ARTERIAL

## 2015-11-04 MED ORDER — SODIUM CHLORIDE 0.9 % IJ SOLN
INTRAMUSCULAR | Status: AC
  Filled 2015-11-04: qty 3

## 2015-11-04 MED ORDER — MIDAZOLAM HCL 5 MG/5ML IJ SOLN
INTRAMUSCULAR | Status: DC | PRN
  Administered 2015-11-04: 2 mg via INTRAVENOUS

## 2015-11-04 MED ORDER — SILVER SULFADIAZINE 1 % EX CREA: TOPICAL_CREAM | Freq: Once | CUTANEOUS | Status: DC

## 2015-11-04 MED ORDER — ONDANSETRON HCL 4 MG/2ML IJ SOLN: 4.0000 mg | Freq: Once | INTRAMUSCULAR | Status: DC | PRN

## 2015-11-04 MED ORDER — MIDAZOLAM HCL 2 MG/2ML IJ SOLN
INTRAMUSCULAR | Status: DC | PRN
  Administered 2015-11-04: 2 mg via INTRAVENOUS
  Administered 2015-11-04: 1 mg via INTRAVENOUS

## 2015-11-04 MED ORDER — LIDOCAINE HCL (CARDIAC) 20 MG/ML IV SOLN
INTRAVENOUS | Status: DC | PRN
  Administered 2015-11-04: 50 mg via INTRAVENOUS

## 2015-11-04 SURGICAL SUPPLY — 27 items
BNDG COHESIVE 6X5 TAN STRL LF (GAUZE/BANDAGES/DRESSINGS) IMPLANT
BNDG GAUZE 4.5X4.1 6PLY STRL (MISCELLANEOUS) ×3 IMPLANT
CANISTER SUCT 1200ML W/VALVE (MISCELLANEOUS) ×3 IMPLANT
CHLORAPREP W/TINT 26ML (MISCELLANEOUS) IMPLANT
DRSG EMULSION OIL 3X3 NADH (GAUZE/BANDAGES/DRESSINGS) IMPLANT
DRSG VAC ATS MED SENSATRAC (GAUZE/BANDAGES/DRESSINGS) IMPLANT
GAUZE SPONGE 4X4 12PLY STRL (GAUZE/BANDAGES/DRESSINGS) ×3 IMPLANT
GAUZE STRETCH 2X75IN STRL (MISCELLANEOUS) IMPLANT
GLOVE SURG SYN 8.0 (GLOVE) ×12 IMPLANT
GOWN STRL REUS W/ TWL LRG LVL3 (GOWN DISPOSABLE) ×1 IMPLANT
GOWN STRL REUS W/ TWL XL LVL3 (GOWN DISPOSABLE) ×1 IMPLANT
GOWN STRL REUS W/TWL LRG LVL3 (GOWN DISPOSABLE) ×2
GOWN STRL REUS W/TWL XL LVL3 (GOWN DISPOSABLE) ×2
KIT RM TURNOVER STRD PROC AR (KITS) ×3 IMPLANT
LABEL OR SOLS (LABEL) IMPLANT
NS IRRIG 500ML POUR BTL (IV SOLUTION) ×3 IMPLANT
PACK EXTREMITY ARMC (MISCELLANEOUS) ×3 IMPLANT
PAD GROUND ADULT SPLIT (MISCELLANEOUS) ×3 IMPLANT
PAD PREP 24X41 OB/GYN DISP (PERSONAL CARE ITEMS) ×3 IMPLANT
SOL PREP PVP 2OZ (MISCELLANEOUS) ×3
SOLUTION PREP PVP 2OZ (MISCELLANEOUS) ×1 IMPLANT
STOCKINETTE IMPERV 14X48 (MISCELLANEOUS) IMPLANT
SUT ETHILON 4-0 (SUTURE) ×2
SUT ETHILON 4-0 FS2 18XMFL BLK (SUTURE) ×1
SUTURE ETHLN 4-0 FS2 18XMF BLK (SUTURE) ×1 IMPLANT
SWAB CULTURE AMIES ANAERIB BLU (MISCELLANEOUS) ×3 IMPLANT
WND VAC CANISTER 500ML (MISCELLANEOUS) IMPLANT

## 2015-11-04 SURGICAL SUPPLY — 25 items
BALLN ARMADA 2X40X150 (BALLOONS) ×3
BALLN LUTONIX DCB 4X40X130 (BALLOONS) ×3
BALLN ULTRVRSE 2X150X150 (BALLOONS) ×2
BALLN ULTRVRSE 2X150X150 OTW (BALLOONS) ×1
BALLN ULTRVRSE 3X300X150 (BALLOONS) ×2
BALLN ULTRVRSE 3X300X150 OTW (BALLOONS) ×1
BALLOON ARMADA 2X40X150 (BALLOONS) ×1 IMPLANT
BALLOON LUTONIX DCB 4X40X130 (BALLOONS) ×1 IMPLANT
BALLOON ULTRVRSE 2X150X150 OTW (BALLOONS) ×1 IMPLANT
BALLOON ULTRVRSE 3X300X150 OTW (BALLOONS) ×1 IMPLANT
CATH PIG 70CM (CATHETERS) ×3 IMPLANT
DEVICE PRESTO INFLATION (MISCELLANEOUS) ×3 IMPLANT
DEVICE STARCLOSE SE CLOSURE (Vascular Products) ×3 IMPLANT
DEVICE TORQUE (MISCELLANEOUS) ×3 IMPLANT
GLIDECATH ANGLED 4FR 120CM (CATHETERS) ×3 IMPLANT
GLIDEWIRE ANGLED SS 035X260CM (WIRE) ×3 IMPLANT
PACK ANGIOGRAPHY (CUSTOM PROCEDURE TRAY) ×3 IMPLANT
SET INTRO CAPELLA COAXIAL (SET/KITS/TRAYS/PACK) ×3 IMPLANT
SHEATH BRITE TIP 5FRX11 (SHEATH) ×3 IMPLANT
SHEATH RAABE 6FR (SHEATH) ×3 IMPLANT
SYR MEDRAD MARK V 150ML (SYRINGE) ×3 IMPLANT
TUBING CONTRAST HIGH PRESS 72 (TUBING) ×3 IMPLANT
VALVE HEMO TOUHY BORST Y (VALVE) ×3 IMPLANT
WIRE G V18X300CM (WIRE) ×3 IMPLANT
WIRE J 3MM .035X145CM (WIRE) ×3 IMPLANT

## 2015-11-04 NOTE — Consult Note (Signed)
ANTIBIOTIC CONSULT NOTE -FOLLOW UP   Pharmacy Consult for Unasyn  Indication: MSSA Bacteremia and Providencia in Wound   No Known Allergies  Patient Measurements: Height: 5\' 8"  (172.7 cm) Weight: 178 lb 11.2 oz (81.058 kg) IBW/kg (Calculated) : 68.4  Vital Signs: Temp: 98.2 F (36.8 C) (12/30 0525) Temp Source: Oral (12/30 0525) BP: 124/68 mmHg (12/30 0525) Pulse Rate: 76 (12/30 0525) Intake/Output from previous day: 12/29 0701 - 12/30 0700 In: 360 [P.O.:360] Out: 400 [Urine:400] Intake/Output from this shift:    Recent Labs  11/02/15 0549  CREATININE 1.21   Estimated Creatinine Clearance: 60.5 mL/min (by C-G formula based on Cr of 1.21).  Recent Labs  11/01/15 1701  VANCOTROUGH 18    Microbiology: Recent Results (from the past 720 hour(s))  Culture, blood (routine x 2)     Status: None   Collection Time: 10/30/15  4:44 PM  Result Value Ref Range Status   Specimen Description BLOOD LEFT WRIST  Final   Special Requests BOTTLES DRAWN AEROBIC AND ANAEROBIC  2CC  Final   Culture  Setup Time   Final    GRAM POSITIVE COCCI IN CLUSTERS AEROBIC BOTTLE ONLY CRITICAL RESULT CALLED TO, READ BACK BY AND VERIFIED WITH: JASON ROBBINS AT 1710 ON 10/31/15 CTJ    Culture STAPHYLOCOCCUS AUREUS AEROBIC BOTTLE ONLY   Final   Report Status 11/04/2015 FINAL  Final   Organism ID, Bacteria STAPHYLOCOCCUS AUREUS  Final      Susceptibility   Staphylococcus aureus - MIC*    CIPROFLOXACIN <=0.5 SENSITIVE Sensitive     ERYTHROMYCIN 0.5 SENSITIVE Sensitive     GENTAMICIN <=0.5 SENSITIVE Sensitive     OXACILLIN <=0.25 SENSITIVE Sensitive     TRIMETH/SULFA <=10 SENSITIVE Sensitive     CLINDAMYCIN <=0.25 SENSITIVE Sensitive     CEFOXITIN SCREEN NEGATIVE Sensitive     Inducible Clindamycin NEGATIVE Sensitive     TETRACYCLINE Value in next row Sensitive      SENSITIVE<=1    * STAPHYLOCOCCUS AUREUS  Blood Culture ID Panel (Reflexed)     Status: Abnormal   Collection Time: 10/30/15   4:44 PM  Result Value Ref Range Status   Enterococcus species NOT DETECTED NOT DETECTED Final   Listeria monocytogenes NOT DETECTED NOT DETECTED Final   Staphylococcus species DETECTED (A) NOT DETECTED Corrected    Comment: CORRECTED ON 12/27 AT 0801: PREVIOUSLY REPORTED AS NOT DETECTED   Staphylococcus aureus DETECTED (A) NOT DETECTED Final    Comment: CRITICAL RESULT CALLED TO, READ BACK BY AND VERIFIED WITH: JASON ROBBINS AT 1710 ON 10/31/15 CTJ    Streptococcus species NOT DETECTED NOT DETECTED Final   Streptococcus agalactiae NOT DETECTED NOT DETECTED Final   Streptococcus pneumoniae NOT DETECTED NOT DETECTED Final   Streptococcus pyogenes NOT DETECTED NOT DETECTED Final   Acinetobacter baumannii NOT DETECTED NOT DETECTED Final   Enterobacteriaceae species NOT DETECTED NOT DETECTED Final   Enterobacter cloacae complex NOT DETECTED NOT DETECTED Final   Escherichia coli NOT DETECTED NOT DETECTED Final   Klebsiella oxytoca NOT DETECTED NOT DETECTED Final   Klebsiella pneumoniae NOT DETECTED NOT DETECTED Final   Proteus species NOT DETECTED NOT DETECTED Final   Serratia marcescens NOT DETECTED NOT DETECTED Final   Haemophilus influenzae NOT DETECTED NOT DETECTED Final   Neisseria meningitidis NOT DETECTED NOT DETECTED Final   Pseudomonas aeruginosa NOT DETECTED NOT DETECTED Final   Candida albicans NOT DETECTED NOT DETECTED Final   Candida glabrata NOT DETECTED NOT DETECTED Final   Candida  krusei NOT DETECTED NOT DETECTED Final   Candida parapsilosis NOT DETECTED NOT DETECTED Final   Candida tropicalis NOT DETECTED NOT DETECTED Final   Carbapenem resistance NOT DETECTED NOT DETECTED Final   Methicillin resistance NOT DETECTED NOT DETECTED Final   Vancomycin resistance NOT DETECTED NOT DETECTED Final  Culture, blood (routine x 2)     Status: None   Collection Time: 10/30/15  5:31 PM  Result Value Ref Range Status   Specimen Description BLOOD LEFT HAND  Final   Special Requests  BOTTLES DRAWN AEROBIC AND ANAEROBIC  2CC  Final   Culture  Setup Time   Final    GRAM POSITIVE COCCI IN CHAINS IN BOTH AEROBIC AND ANAEROBIC BOTTLES CRITICAL RESULT CALLED TO, READ BACK BY AND VERIFIED WITH: KAREN HAYES AT 1045 10/31/15 CTJ    Culture   Final    VIRIDANS STREPTOCOCCUS IN BOTH AEROBIC AND ANAEROBIC BOTTLES MULTIPLE SPECIES PRESENT Results consistent with contamination.    Report Status 11/02/2015 FINAL  Final  Blood Culture ID Panel (Reflexed)     Status: Abnormal   Collection Time: 10/30/15  5:31 PM  Result Value Ref Range Status   Enterococcus species NOT DETECTED NOT DETECTED Final   Listeria monocytogenes NOT DETECTED NOT DETECTED Final   Staphylococcus species NOT DETECTED NOT DETECTED Final   Staphylococcus aureus NOT DETECTED NOT DETECTED Final   Streptococcus species DETECTED (A) NOT DETECTED Final    Comment: CRITICAL RESULT CALLED TO, READ BACK BY AND VERIFIED WITH: KAREN HAYES AT 1045 ON 10/31/15 CTJ    Streptococcus agalactiae NOT DETECTED NOT DETECTED Final   Streptococcus pneumoniae NOT DETECTED NOT DETECTED Final   Streptococcus pyogenes NOT DETECTED NOT DETECTED Final   Acinetobacter baumannii NOT DETECTED NOT DETECTED Final   Enterobacteriaceae species NOT DETECTED NOT DETECTED Final   Enterobacter cloacae complex NOT DETECTED NOT DETECTED Final   Escherichia coli NOT DETECTED NOT DETECTED Final   Klebsiella oxytoca NOT DETECTED NOT DETECTED Final   Klebsiella pneumoniae NOT DETECTED NOT DETECTED Final   Proteus species NOT DETECTED NOT DETECTED Final   Serratia marcescens NOT DETECTED NOT DETECTED Final   Haemophilus influenzae NOT DETECTED NOT DETECTED Final   Neisseria meningitidis NOT DETECTED NOT DETECTED Final   Pseudomonas aeruginosa NOT DETECTED NOT DETECTED Final   Candida albicans NOT DETECTED NOT DETECTED Final   Candida glabrata NOT DETECTED NOT DETECTED Final   Candida krusei NOT DETECTED NOT DETECTED Final   Candida parapsilosis  NOT DETECTED NOT DETECTED Final   Candida tropicalis NOT DETECTED NOT DETECTED Final   Carbapenem resistance NOT DETECTED NOT DETECTED Final   Methicillin resistance NOT DETECTED NOT DETECTED Final   Vancomycin resistance NOT DETECTED NOT DETECTED Final  CULTURE, BLOOD (ROUTINE X 2) w Reflex to PCR ID Panel     Status: None (Preliminary result)   Collection Time: 10/31/15 11:35 AM  Result Value Ref Range Status   Specimen Description BLOOD RIGHT HAND  Final   Special Requests BOTTLES DRAWN AEROBIC AND ANAEROBIC 2CC  Final   Culture NO GROWTH 3 DAYS  Final   Report Status PENDING  Incomplete  CULTURE, BLOOD (ROUTINE X 2) w Reflex to PCR ID Panel     Status: None (Preliminary result)   Collection Time: 10/31/15 11:35 AM  Result Value Ref Range Status   Specimen Description BLOOD RIGHT ASSIST CONTROL  Final   Special Requests BOTTLES DRAWN AEROBIC AND ANAEROBIC 2CC  Final   Culture NO GROWTH 3 DAYS  Final  Report Status PENDING  Incomplete  Wound culture     Status: None   Collection Time: 10/31/15  2:12 PM  Result Value Ref Range Status   Specimen Description ULCER  Final   Special Requests NONE  Final   Gram Stain   Final    RARE WBC SEEN MODERATE GRAM POSITIVE COCCI IN PAIRS IN CLUSTERS RARE GRAM NEGATIVE RODS RARE GRAM POSITIVE RODS    Culture   Final    MODERATE GROWTH PROVIDENCIA RETTGERI HEAVY GROWTH STAPHYLOCOCCUS AUREUS    Report Status 11/03/2015 FINAL  Final   Organism ID, Bacteria PROVIDENCIA RETTGERI  Final   Organism ID, Bacteria STAPHYLOCOCCUS AUREUS  Final      Susceptibility   Staphylococcus aureus - MIC*    CIPROFLOXACIN <=0.5 SENSITIVE Sensitive     ERYTHROMYCIN <=0.25 SENSITIVE Sensitive     GENTAMICIN <=0.5 SENSITIVE Sensitive     OXACILLIN <=0.25 SENSITIVE Sensitive     TRIMETH/SULFA <=10 SENSITIVE Sensitive     CLINDAMYCIN <=0.25 SENSITIVE Sensitive     CEFOXITIN SCREEN NEGATIVE Sensitive     Inducible Clindamycin NEGATIVE Sensitive     TETRACYCLINE  Value in next row Sensitive      SENSITIVE<=1    * HEAVY GROWTH STAPHYLOCOCCUS AUREUS   Providencia rettgeri - MIC*    AMPICILLIN Value in next row Resistant      SENSITIVE<=1    CEFTAZIDIME Value in next row Sensitive      SENSITIVE<=1    CEFAZOLIN Value in next row Resistant      SENSITIVE<=1    CEFTRIAXONE Value in next row Sensitive      SENSITIVE<=1    CIPROFLOXACIN Value in next row Sensitive      SENSITIVE<=1    GENTAMICIN Value in next row Sensitive      SENSITIVE<=1    IMIPENEM Value in next row Sensitive      SENSITIVE<=1    TRIMETH/SULFA Value in next row Resistant      SENSITIVE<=1    PIP/TAZO Value in next row Sensitive      SENSITIVE<=4    AMPICILLIN/SULBACTAM Value in next row Sensitive      SENSITIVE<=2    * MODERATE GROWTH PROVIDENCIA RETTGERI  Culture, blood (Routine X 2) w Reflex to ID Panel     Status: None (Preliminary result)   Collection Time: 11/01/15  5:07 PM  Result Value Ref Range Status   Specimen Description BLOOD RIGHT ASSIST CONTROL  Final   Special Requests BOTTLES DRAWN AEROBIC AND ANAEROBIC 5CC  Final   Culture NO GROWTH 2 DAYS  Final   Report Status PENDING  Incomplete  Culture, blood (Routine X 2) w Reflex to ID Panel     Status: None (Preliminary result)   Collection Time: 11/01/15  5:07 PM  Result Value Ref Range Status   Specimen Description BLOOD RIGHT HAND  Final   Special Requests BOTTLES DRAWN AEROBIC AND ANAEROBIC 3CC  Final   Culture NO GROWTH 2 DAYS  Final   Report Status PENDING  Incomplete    Medical History: Past Medical History  Diagnosis Date  . Diabetes mellitus without complication (HCC)   . Stented coronary artery   . Myocardial infarct (HCC)   . Hypertension   . Coronary artery disease    Assessment: Pt is a 64 year old male with cellulitis/osetomyelitis to his great right toe/foot.  Wound cultures showing  Providencia rettgeri and blood cultures positive for MSSA. Pharmacy consulted to dose Unasyn based on  updated susceptibilities .   Plan:  Will continue the patient on Unasyn 3g IV q6H. Will continue to monitor cultures and susceptibilities.  Pharmacy will continue to monitor renal function and labs and make adjustments as needed.   Demetrius Charity, PharmD   11/04/2015,11:22 AM

## 2015-11-04 NOTE — Progress Notes (Signed)
PT Cancellation Note  Patient Details Name: Daniel [name] MRN: [id number] DOB: 08/20/1951   Cancelled Treatment:    Reason Eval/Treat Not Completed: Patient at procedure or test/unavailable (Patient currently off unit for vascular procedure and irrigation/debridement of R foot wound.  If completed under general anesthesia, will require new PT orders to resume services.  Will continue to follow and progress as medically appropriate.)  Liadan Guizar H. Manson PasseyBrown, PT, DPT, NCS 11/04/2015, 12:03 PM [phone]

## 2015-11-04 NOTE — Progress Notes (Signed)
FSBS 65, pt is NPO, verifying with MD if patient can receive amp of D 50 for low blood sugar prior to surgery. Per MD okay to give amp of d 50. Telephone with Dr. Madelon LipsVacchani.

## 2015-11-04 NOTE — Progress Notes (Addendum)
Pt is alert and oriented, agitated at times, easily aggravated, up with one assist, off unit most of the day for angiograph, returned with incision in left groin. Family at bedside, FSBS 65, amp of d 50 given, OR arrived for patient prior to 30 min FSBS check post d50 administration, OR tech notified and RN notified during pre surgery report. Dilaudid given during angiograph, added as allergy due to agitation experienced with administration, patient agreed that dilaudid makes him agitated. Oxycodone given prior to leaving for OR for pain in right foot. [facility] called for PICC placement. Pt saline locked per request of OR tech.

## 2015-11-04 NOTE — Op Note (Signed)
[facility] Percutaneous Study/Intervention Procedural Note   Date of Surgery: 11/04/2015  Surgeon:  Katha Cabal, MD.  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 Introduction catheter into right lower extremity 3rd order catheter placement  2. Contrast injection right lower extremity for distal runoff   3. Percutaneous transluminal angioplasty right popliteal to 4 mm with Lutonix balloon 4. Percutaneous transluminal angioplasty right posterior tibial to 3 mm             5.   Star close closure left common femoral arteriotomy  Anesthesia: Conscious sedation with IV Versed and fentanyl  Sheath: 6 French Rabi sheath  Contrast: 75 cc  Fluoroscopy Time: 10 minutes  Indications: Daniel [name] presents with diabetic foot infection and ulceration of the right great toe. He has nonpalpable pulses. The risks and benefits are reviewed all questions answered patient agrees to proceed.  Procedure: Daniel [name] is a 64 y.o. y.o. male who was identified and appropriate procedural time out was performed. The patient was then placed supine on the table and prepped and draped in the usual sterile fashion.   Ultrasound was placed in the sterile sleeve and the left groin was evaluated the left common femoral artery was echolucent and pulsatile indicating patency.  Image was recorded for the permanent record and under real-time visualization a microneedle was inserted into the common femoral artery microwire followed by a micro-sheath.  A J-wire was then advanced through the micro-sheath and a  5 [place] sheath was then inserted over a J-wire. J-wire was then advanced and a 5 French pigtail catheter was positioned at the level of T12. AP  projection of the aorta was then obtained. Pigtail catheter was repositioned to above the bifurcation and a LAO view of the pelvis was obtained.  Subsequently a pigtail catheter with the stiff angle Glidewire was used to cross the aortic bifurcation the catheter wire were advanced down into the right distal external iliac artery. Oblique view of the femoral bifurcation was then obtained and subsequently the wire was reintroduced and the pigtail catheter negotiated into the SFA representing third order catheter placement. Distal runoff was then performed.  5000 units of heparin was then given and allowed to circulate and a 6 [place] Rabi sheath was advanced up and over the bifurcation and positioned in the femoral artery  KMP  catheter and stiff angle Glidewire were then negotiated down into the distal popliteal.  Distal runoff was then completed by hand injection through the catheter. The wire was then reintroduced and a 4 x 4 Lutonix balloon was used to angioplasty the popliteal artery.  Follow-up imaging demonstrated less than 5% residual stenosis and attention was then turned to the posterior tibial artery.     A 3 mm x 30 cm balloon was then advanced down to the ankle and angioplasty from the ankle up to the origin of the posterior tibial is performed to 12 atm for 2 minutes. Subsequently a 2 x 15 balloon was advanced across the ankle extending to the lateral plantar and angioplasty was to 12 atm for approximately 2 minutes as well. Follow-up imaging demonstrated wide patency the anterior tibial throughout it's course.  After review of these images the sheath is pulled into the left external iliac oblique of the common femoral is obtained and a Star close device deployed. There no immediate Complications.  Findings: The  abdominal aorta is opacified with a bolus injection of contrast. It is widely patent and there is little evidence of atherosclerotic changes. Bilateral common internal and external  iliac arteries are all widely patent.  The right common femoral and profunda femoris are widely patent. The superficial femoral artery is patent although there is patchy areas of calcific disease there are no hemodynamically significant lesions. In the mid popliteal there is a focal greater than 80% narrowing with reconstitution of the distal popliteal. Anterior tibial occludes just after it's origin posterior tibial is diffusely diseased throughout its course with several areas of occlusion peroneal artery is occluded throughout its course. There is very distal disease in the posterior tibial at the bifurcation of the lateral and medial plantar arteries however these do feel the pedal arch.  Following angioplasty of the mid popliteal lesion there is complete resolution with less than 5% residual stenosis. Following angioplasty of the posterior tibial there is now in-line flow directly down to the lateral plantar and the pedal arch.  SUMMARY: Successful revascularization of the right lower extremity in preparation for debridement of his diabetic foot infection.  Disposition: Patient was taken to the recovery room in stable condition having tolerated the procedure well.  Daniel [name] 08/09/2015,3:14 PM

## 2015-11-04 NOTE — OR Nursing (Signed)
#   24 saline lock noted in right hand

## 2015-11-04 NOTE — Transfer of Care (Signed)
Immediate Anesthesia Transfer of Care Note  Patient: Daniel [name]  Procedure(s) Performed: Procedure(s): DEBRIDEMENT WOUND (Right)  Patient Location: PACU  Anesthesia Type:General  Level of Consciousness: sedated  Airway & Oxygen Therapy: Patient Spontanous Breathing and Patient connected to face mask oxygen  Post-op Assessment: Report given to RN and Post -op Vital signs reviewed and stable  Post vital signs: Reviewed and stable  Last Vitals:  Filed Vitals:   11/04/15 1534 11/04/15 2009  BP: 146/70 144/81  Pulse: 71 84  Temp: 36.8 C 36.4 C  Resp:  12    Complications: No apparent anesthesia complications

## 2015-11-04 NOTE — Progress Notes (Signed)
[facility] [place] at [facility]                                                                                                                                                                                            Patient Demographics   Daniel [name], is a 64 y.o. male, DOB - 01-29-1951, BJY:[id number]  Admit date - 10/30/2015   Admitting Physician Ramonita Lab, MD  Outpatient Primary MD for the patient is [name],[name], MD   LOS - 5  Subjective: Patient admitted with shortness of breath,  right toe erythema and pain, also left sided new eye visual difficulties. Continues to have pain right foot. SOB improved. No CP. Afebrile. Off Oxygen  Angio and I& D of right toe is planned today, he is upset of being NPO- as he is scheduled to have the surgery later in afternoon.   Review of Systems:   CONSTITUTIONAL: No documented fever. No fatigue, weakness. No weight gain, no weight loss.  EYES: No blurry or double vision. Left eye visual difficulties ENT: No tinnitus. No postnasal drip. No redness of the oropharynx.  RESPIRATORY: No cough, no wheeze, no hemoptysis. Positive dyspnea.  CARDIOVASCULAR: No chest pain. No orthopnea. No palpitations. No syncope.  GASTROINTESTINAL: No nausea, no vomiting or diarrhea. No abdominal pain. No melena or hematochezia.  GENITOURINARY: No dysuria or hematuria.  ENDOCRINE: No polyuria or nocturia. No heat or cold intolerance.  HEMATOLOGY: No anemia. No bruising. No bleeding.  INTEGUMENTARY: No rashes. No lesions.  MUSCULOSKELETAL: No arthritis. No swelling. No gout. Right foot pain. NEUROLOGIC: No numbness, tingling, or ataxia. No seizure-type activity. Generalized weakness PSYCHIATRIC: No anxiety. No insomnia. No ADD.     Vitals:   Filed Vitals:   11/03/15 2202 11/04/15 0523 11/04/15 0525 11/04/15 0526  BP: 141/70  124/68   Pulse: 85  76   Temp:   98.2 F (36.8 C)   TempSrc:   Oral   Resp:   26   Height:       Weight:  82.419 kg (181 lb 11.2 oz)  81.058 kg (178 lb 11.2 oz)  SpO2:   96%     Wt Readings from Last 3 Encounters:  11/04/15 81.058 kg (178 lb 11.2 oz)  04/27/15 74.844 kg (165 lb)     Intake/Output Summary (Last 24 hours) at 11/04/15 1014 Last data filed at 11/03/15 2221  Gross per 24 hour  Intake    240 ml  Output    400 ml  Net   -160 ml    Physical Exam:   GENERAL: Pleasant-appearing in no apparent distress.  HEAD, EYES, EARS, NOSE AND THROAT: Atraumatic, normocephalic. Extraocular muscles are intact. Pupils equal and reactive to light. Sclerae anicteric. No conjunctival injection. No oro-pharyngeal erythema.  NECK: Supple. There is no jugular venous distention. No bruits, no lymphadenopathy, no thyromegaly.  HEART: Regular rate and rhythm,. No murmurs, no rubs, no clicks.  LUNGS: Bilateral crackles at the bases ABDOMEN: Soft, flat, nontender, nondistended. Has good bowel sounds. No hepatosplenomegaly appreciated.  EXTREMITIES: right foot redenss, with cynosis on great toe, with tenderness and swelling on whole foot. NEUROLOGIC: The patient is alert, awake, and oriented x3 with no focal motor deficits appreciated bilaterally. Decreased sensations feet. SKIN: Right great toe erythematous surrounding cellulitis  Psych: Not anxious, depressed LN: No inguinal LN enlargement Right foot redness with gangrene great toe.   Antibiotics   Anti-infectives    Start     Dose/Rate Route Frequency Ordered Stop   11/02/15 1800  Ampicillin-Sulbactam (UNASYN) 3 g in sodium chloride 0.9 % 100 mL IVPB     3 g 100 mL/hr over 60 Minutes Intravenous Every 6 hours 11/02/15 1522     11/02/15 1400  piperacillin-tazobactam (ZOSYN) IVPB 3.375 g  Status:  Discontinued     3.375 g 12.5 mL/hr over 240 Minutes Intravenous 3 times per day 11/02/15 1350 11/02/15 1444   10/31/15 1130  Ampicillin-Sulbactam (UNASYN) 3 g in sodium chloride 0.9 % 100 mL IVPB  Status:  Discontinued     3 g 100 mL/hr  over 60 Minutes Intravenous Every 6 hours 10/31/15 1035 11/02/15 1334   10/31/15 0500  vancomycin (VANCOCIN) IVPB 1000 mg/200 mL premix  Status:  Discontinued     1,000 mg 200 mL/hr over 60 Minutes Intravenous Every 12 hours 10/30/15 2248 11/02/15 1333   10/30/15 2130  vancomycin (VANCOCIN) IVPB 1000 mg/200 mL premix  Status:  Discontinued     1,000 mg 200 mL/hr over 60 Minutes Intravenous  Once 10/30/15 2120 10/30/15 2141   10/30/15 1645  piperacillin-tazobactam (ZOSYN) IVPB 3.375 g     3.375 g 100 mL/hr over 30 Minutes Intravenous  Once 10/30/15 1633 10/30/15 2019   10/30/15 1645  vancomycin (VANCOCIN) IVPB 1000 mg/200 mL premix     1,000 mg 200 mL/hr over 60 Minutes Intravenous  Once 10/30/15 1633 10/30/15 2019      Medications   Scheduled Meds: . ampicillin-sulbactam (UNASYN) IV  3 g Intravenous Q6H  . aspirin EC  81 mg Oral Daily  . atorvastatin  40 mg Oral QHS  . enoxaparin (LOVENOX) injection  40 mg Subcutaneous Q24H  . furosemide  40 mg Oral BID  . glipiZIDE  10 mg Oral BID AC  . insulin aspart  0-9 Units Subcutaneous TID AC & HS  . insulin detemir  5 Units Subcutaneous QHS  . losartan  100 mg Oral Daily  . metFORMIN  1,000 mg Oral BID WC  . metoprolol tartrate  25 mg Oral BID  . pantoprazole  40 mg Oral QAC breakfast  . pregabalin  75 mg Oral BID   Continuous Infusions:   PRN Meds:.acetaminophen, HYDROmorphone (DILAUDID) injection, nitroGLYCERIN, ondansetron (ZOFRAN) IV, oxyCODONE   Data Review:   Micro Results Recent Results (from the past 240 hour(s))  Culture, blood (routine x 2)     Status: None   Collection Time: 10/30/15  4:44 PM  Result Value Ref Range Status   Specimen Description BLOOD LEFT WRIST  Final   Special Requests BOTTLES DRAWN AEROBIC AND ANAEROBIC  2CC  Final   Culture  Setup  Time   Final    GRAM POSITIVE COCCI IN CLUSTERS AEROBIC BOTTLE ONLY CRITICAL RESULT CALLED TO, READ BACK BY AND VERIFIED WITH: JASON ROBBINS AT 1710 ON 10/31/15 CTJ     Culture STAPHYLOCOCCUS AUREUS AEROBIC BOTTLE ONLY   Final   Report Status 11/04/2015 FINAL  Final   Organism ID, Bacteria STAPHYLOCOCCUS AUREUS  Final      Susceptibility   Staphylococcus aureus - MIC*    CIPROFLOXACIN <=0.5 SENSITIVE Sensitive     ERYTHROMYCIN 0.5 SENSITIVE Sensitive     GENTAMICIN <=0.5 SENSITIVE Sensitive     OXACILLIN <=0.25 SENSITIVE Sensitive     TRIMETH/SULFA <=10 SENSITIVE Sensitive     CLINDAMYCIN <=0.25 SENSITIVE Sensitive     CEFOXITIN SCREEN NEGATIVE Sensitive     Inducible Clindamycin NEGATIVE Sensitive     TETRACYCLINE Value in next row Sensitive      SENSITIVE<=1    * STAPHYLOCOCCUS AUREUS  Blood Culture ID Panel (Reflexed)     Status: Abnormal   Collection Time: 10/30/15  4:44 PM  Result Value Ref Range Status   Enterococcus species NOT DETECTED NOT DETECTED Final   Listeria monocytogenes NOT DETECTED NOT DETECTED Final   Staphylococcus species DETECTED (A) NOT DETECTED Corrected    Comment: CORRECTED ON 12/27 AT 0801: PREVIOUSLY REPORTED AS NOT DETECTED   Staphylococcus aureus DETECTED (A) NOT DETECTED Final    Comment: CRITICAL RESULT CALLED TO, READ BACK BY AND VERIFIED WITH: JASON ROBBINS AT 1710 ON 10/31/15 CTJ    Streptococcus species NOT DETECTED NOT DETECTED Final   Streptococcus agalactiae NOT DETECTED NOT DETECTED Final   Streptococcus pneumoniae NOT DETECTED NOT DETECTED Final   Streptococcus pyogenes NOT DETECTED NOT DETECTED Final   Acinetobacter baumannii NOT DETECTED NOT DETECTED Final   Enterobacteriaceae species NOT DETECTED NOT DETECTED Final   Enterobacter cloacae complex NOT DETECTED NOT DETECTED Final   Escherichia coli NOT DETECTED NOT DETECTED Final   Klebsiella oxytoca NOT DETECTED NOT DETECTED Final   Klebsiella pneumoniae NOT DETECTED NOT DETECTED Final   Proteus species NOT DETECTED NOT DETECTED Final   Serratia marcescens NOT DETECTED NOT DETECTED Final   Haemophilus influenzae NOT DETECTED NOT DETECTED Final    Neisseria meningitidis NOT DETECTED NOT DETECTED Final   Pseudomonas aeruginosa NOT DETECTED NOT DETECTED Final   Candida albicans NOT DETECTED NOT DETECTED Final   Candida glabrata NOT DETECTED NOT DETECTED Final   Candida krusei NOT DETECTED NOT DETECTED Final   Candida parapsilosis NOT DETECTED NOT DETECTED Final   Candida tropicalis NOT DETECTED NOT DETECTED Final   Carbapenem resistance NOT DETECTED NOT DETECTED Final   Methicillin resistance NOT DETECTED NOT DETECTED Final   Vancomycin resistance NOT DETECTED NOT DETECTED Final  Culture, blood (routine x 2)     Status: None   Collection Time: 10/30/15  5:31 PM  Result Value Ref Range Status   Specimen Description BLOOD LEFT HAND  Final   Special Requests BOTTLES DRAWN AEROBIC AND ANAEROBIC  2CC  Final   Culture  Setup Time   Final    GRAM POSITIVE COCCI IN CHAINS IN BOTH AEROBIC AND ANAEROBIC BOTTLES CRITICAL RESULT CALLED TO, READ BACK BY AND VERIFIED WITH: KAREN HAYES AT 1045 10/31/15 CTJ    Culture   Final    VIRIDANS STREPTOCOCCUS IN BOTH AEROBIC AND ANAEROBIC BOTTLES MULTIPLE SPECIES PRESENT Results consistent with contamination.    Report Status 11/02/2015 FINAL  Final  Blood Culture ID Panel (Reflexed)     Status: Abnormal  Collection Time: 10/30/15  5:31 PM  Result Value Ref Range Status   Enterococcus species NOT DETECTED NOT DETECTED Final   Listeria monocytogenes NOT DETECTED NOT DETECTED Final   Staphylococcus species NOT DETECTED NOT DETECTED Final   Staphylococcus aureus NOT DETECTED NOT DETECTED Final   Streptococcus species DETECTED (A) NOT DETECTED Final    Comment: CRITICAL RESULT CALLED TO, READ BACK BY AND VERIFIED WITH: KAREN HAYES AT 1045 ON 10/31/15 CTJ    Streptococcus agalactiae NOT DETECTED NOT DETECTED Final   Streptococcus pneumoniae NOT DETECTED NOT DETECTED Final   Streptococcus pyogenes NOT DETECTED NOT DETECTED Final   Acinetobacter baumannii NOT DETECTED NOT DETECTED Final    Enterobacteriaceae species NOT DETECTED NOT DETECTED Final   Enterobacter cloacae complex NOT DETECTED NOT DETECTED Final   Escherichia coli NOT DETECTED NOT DETECTED Final   Klebsiella oxytoca NOT DETECTED NOT DETECTED Final   Klebsiella pneumoniae NOT DETECTED NOT DETECTED Final   Proteus species NOT DETECTED NOT DETECTED Final   Serratia marcescens NOT DETECTED NOT DETECTED Final   Haemophilus influenzae NOT DETECTED NOT DETECTED Final   Neisseria meningitidis NOT DETECTED NOT DETECTED Final   Pseudomonas aeruginosa NOT DETECTED NOT DETECTED Final   Candida albicans NOT DETECTED NOT DETECTED Final   Candida glabrata NOT DETECTED NOT DETECTED Final   Candida krusei NOT DETECTED NOT DETECTED Final   Candida parapsilosis NOT DETECTED NOT DETECTED Final   Candida tropicalis NOT DETECTED NOT DETECTED Final   Carbapenem resistance NOT DETECTED NOT DETECTED Final   Methicillin resistance NOT DETECTED NOT DETECTED Final   Vancomycin resistance NOT DETECTED NOT DETECTED Final  CULTURE, BLOOD (ROUTINE X 2) w Reflex to PCR ID Panel     Status: None (Preliminary result)   Collection Time: 10/31/15 11:35 AM  Result Value Ref Range Status   Specimen Description BLOOD RIGHT HAND  Final   Special Requests BOTTLES DRAWN AEROBIC AND ANAEROBIC 2CC  Final   Culture NO GROWTH 3 DAYS  Final   Report Status PENDING  Incomplete  CULTURE, BLOOD (ROUTINE X 2) w Reflex to PCR ID Panel     Status: None (Preliminary result)   Collection Time: 10/31/15 11:35 AM  Result Value Ref Range Status   Specimen Description BLOOD RIGHT ASSIST CONTROL  Final   Special Requests BOTTLES DRAWN AEROBIC AND ANAEROBIC 2CC  Final   Culture NO GROWTH 3 DAYS  Final   Report Status PENDING  Incomplete  Wound culture     Status: None   Collection Time: 10/31/15  2:12 PM  Result Value Ref Range Status   Specimen Description ULCER  Final   Special Requests NONE  Final   Gram Stain   Final    RARE WBC SEEN MODERATE GRAM POSITIVE  COCCI IN PAIRS IN CLUSTERS RARE GRAM NEGATIVE RODS RARE GRAM POSITIVE RODS    Culture   Final    MODERATE GROWTH PROVIDENCIA RETTGERI HEAVY GROWTH STAPHYLOCOCCUS AUREUS    Report Status 11/03/2015 FINAL  Final   Organism ID, Bacteria PROVIDENCIA RETTGERI  Final   Organism ID, Bacteria STAPHYLOCOCCUS AUREUS  Final      Susceptibility   Staphylococcus aureus - MIC*    CIPROFLOXACIN <=0.5 SENSITIVE Sensitive     ERYTHROMYCIN <=0.25 SENSITIVE Sensitive     GENTAMICIN <=0.5 SENSITIVE Sensitive     OXACILLIN <=0.25 SENSITIVE Sensitive     TRIMETH/SULFA <=10 SENSITIVE Sensitive     CLINDAMYCIN <=0.25 SENSITIVE Sensitive     CEFOXITIN SCREEN NEGATIVE Sensitive  Inducible Clindamycin NEGATIVE Sensitive     TETRACYCLINE Value in next row Sensitive      SENSITIVE<=1    * HEAVY GROWTH STAPHYLOCOCCUS AUREUS   Providencia rettgeri - MIC*    AMPICILLIN Value in next row Resistant      SENSITIVE<=1    CEFTAZIDIME Value in next row Sensitive      SENSITIVE<=1    CEFAZOLIN Value in next row Resistant      SENSITIVE<=1    CEFTRIAXONE Value in next row Sensitive      SENSITIVE<=1    CIPROFLOXACIN Value in next row Sensitive      SENSITIVE<=1    GENTAMICIN Value in next row Sensitive      SENSITIVE<=1    IMIPENEM Value in next row Sensitive      SENSITIVE<=1    TRIMETH/SULFA Value in next row Resistant      SENSITIVE<=1    PIP/TAZO Value in next row Sensitive      SENSITIVE<=4    AMPICILLIN/SULBACTAM Value in next row Sensitive      SENSITIVE<=2    * MODERATE GROWTH PROVIDENCIA RETTGERI  Culture, blood (Routine X 2) w Reflex to ID Panel     Status: None (Preliminary result)   Collection Time: 11/01/15  5:07 PM  Result Value Ref Range Status   Specimen Description BLOOD RIGHT ASSIST CONTROL  Final   Special Requests BOTTLES DRAWN AEROBIC AND ANAEROBIC 5CC  Final   Culture NO GROWTH 2 DAYS  Final   Report Status PENDING  Incomplete  Culture, blood (Routine X 2) w Reflex to ID Panel      Status: None (Preliminary result)   Collection Time: 11/01/15  5:07 PM  Result Value Ref Range Status   Specimen Description BLOOD RIGHT HAND  Final   Special Requests BOTTLES DRAWN AEROBIC AND ANAEROBIC 3CC  Final   Culture NO GROWTH 2 DAYS  Final   Report Status PENDING  Incomplete    Radiology Reports Ct Head Wo Contrast  10/30/2015  CLINICAL DATA:  Generalized weakness, diabetes, hypertension, right visual disturbance EXAM: CT HEAD WITHOUT CONTRAST TECHNIQUE: Contiguous axial images were obtained from the base of the skull through the vertex without contrast. COMPARISON:  03/01/2011 FINDINGS: Mild brain atrophy and chronic white matter microvascular ischemic changes throughout the periventricular white matter. These changes have slightly progressed. No acute intracranial hemorrhage, mass lesion, definite infarction, midline shift, herniation, hydrocephalus, or extra-axial fluid collection. Cisterns patent. No cerebellar abnormality. Skull intact. Mastoids and sinuses remain clear. Atherosclerosis of the intracranial vessels. IMPRESSION: Atrophy and slight progression of chronic white matter microvascular ischemic changes. No acute intracranial process by noncontrast CT. Electronically Signed   By: Judie Petit.  Shick M.D.   On: 10/30/2015 18:15   Mr Laqueta Jean WU Contrast  11/01/2015  CLINICAL DATA:  64 year old male with progressive lower extremity weakness for 6 months. Initial encounter. Diabetes EXAM: MRI HEAD WITHOUT AND WITH CONTRAST TECHNIQUE: Multiplanar, multiecho pulse sequences of the brain and surrounding structures were obtained without and with intravenous contrast. CONTRAST:  16mL MULTIHANCE GADOBENATE DIMEGLUMINE 529 MG/ML IV SOLN COMPARISON:  Head CT without contrast 10/30/2015. [facility] postoperative cervical spine radiograph 07/19/2011. FINDINGS: Major intracranial vascular flow voids are preserved. Late subacute to early chronic appearing right corona radiata  10 mm focus of diffusion signal change (series 100, image 36) with T2 shine through. No restricted diffusion or evidence of acute infarction. Additional patchy bilateral cerebral white matter T2 and FLAIR hyperintensity. Chronic lacunar infarct  in the *SCRATCH* SPECT chronic lacunar infarcts in the right thalamus. Mild for age patchy T2 hyperintensity in the pons, but with associated pontine chronic micro hemorrhage (series 9, image 8). No cortical encephalomalacia. No midline shift, mass effect, evidence of mass lesion, ventriculomegaly, extra-axial collection or acute intracranial hemorrhage. Cervicomedullary junction and pituitary are within normal limits. Negative visualized cervical spine aside from hardware susceptibility artifact. No abnormal enhancement identified. Incidental anterior right frontal lobe developmental venous anomaly (normal anatomic variant). Visible internal auditory structures appear normal. Mastoids and paranasal sinuses are clear. Postoperative changes to the globes. Otherwise negative scalp and orbits soft tissues. Normal bone marrow signal. IMPRESSION: No acute intracranial abnormality. Moderate for age chronic small vessel disease with late subacute lacune in the right corona radiata. Electronically Signed   By: Odessa Fleming M.D.   On: 11/01/2015 10:19   Mr Foot Right Wo Contrast  11/01/2015  CLINICAL DATA:  Ulceration in a about 7 mm along the medial right great toe with purulence noted. Erythema extends dorsally in the foot. EXAM: MRI OF THE RIGHT FOREFOOT WITHOUT CONTRAST TECHNIQUE: Multiplanar, multisequence MR imaging was performed. No intravenous contrast was administered. COMPARISON:  10/30/2015 FINDINGS: There is subcutaneous edema diffusely in the foot especially dorsally, and extending into the ankle both medially and laterally. Low-level edema extends into the toes. On image 32 series 4 there is a defect in the skin and subcutaneous tissues medially along the great toe  approximately at the level of the distal phalanx. The on images 23-25 of series 7 there seems to be abnormal increased inversion recovery weighted signal in the base of the distal phalanx suspicious for early osteomyelitis given the immediately adjacent ulceration. Sharply defined erosions or cystic lesions are present along the Lisfranc joint is, including adjacent to the expected attachment site of the Lisfranc ligament to the second metatarsal base. However, there is no overt malalignment at the Lisfranc joint. Erosions are present at the articulation of the navicular and medial cuneiform. There is thickening of the medial band of the plantar fascia proximally. IMPRESSION: 1. Ulceration medially along the great toe, with underlying edema signal in the marrow of the base of the distal phalanx suspicious for early osteomyelitis. No other osteomyelitis identified. 2. Cystic or erosive lesions along significant portions of the Lisfranc joint and at the articulation of the distal navicular with the medial cuneiform, suspicious for erosive arthropathy such as rheumatoid arthropathy. An alternative would be early Charcot joint. 3. Edema tracking throughout the foot and ankle, especially dorsally, potentially from cellulitis. Electronically Signed   By: Gaylyn Rong M.D.   On: 11/01/2015 10:37   Dg Chest Port 1 View  10/30/2015  CLINICAL DATA:  Generalized weakness and elevated blood glucose today. EXAM: PORTABLE CHEST 1 VIEW COMPARISON:  CT chest 04/27/2015. PA and lateral chest 04/27/2015 and 01/22/2015. FINDINGS: Heart size is normal. The lungs are clear. No pneumothorax or pleural effusion. IMPRESSION: No acute disease. Electronically Signed   By: Drusilla Kanner M.D.   On: 10/30/2015 15:14   Dg Toe Great Right  10/30/2015  CLINICAL DATA:  Right toe swelling for 2 weeks.  Diabetic patient. EXAM: RIGHT GREAT TOE COMPARISON:  None. FINDINGS: There is no evidence of fracture or dislocation. There is no  evidence of cortical erosions to suggest osteomyelitis radiographically. Heterogeneous appearance of the bony matrix of the first phalanx is noted, with uncertain significance. There is soft tissue swelling of the dorsal and palmar aspect of the mid and forefoot. IMPRESSION: No  evidence of fracture or subluxation. No definitive radiographic evidence of osteomyelitis. Heterogeneous appearance of the bony matrix of the first digit with uncertain significance. Electronically Signed   By: Ted Mcalpine M.D.   On: 10/30/2015 13:51     CBC  Recent Labs Lab 10/30/15 1732 10/31/15 0540 11/01/15 0247  WBC 12.0* 10.3 10.2  HGB 13.5 12.5* 12.1*  HCT 40.3 36.8* 36.1*  PLT 207 184 205  MCV 90.8 89.0 89.0  MCH 30.4 30.2 29.8  MCHC 33.5 34.0 33.5  RDW 14.2 14.5 14.5  LYMPHSABS 0.9* 1.4  --   MONOABS 1.1* 1.3*  --   EOSABS 0.0 0.0  --   BASOSABS 0.0 0.0  --     Chemistries   Recent Labs Lab 10/30/15 1424 10/31/15 0540 11/01/15 0247 11/02/15 0549  NA 139 138 137  --   K 3.6 3.8 3.5  --   CL 99* 101 99*  --   CO2 25 28 26   --   GLUCOSE 395* 215* 187*  --   BUN 18 27* 41*  --   CREATININE 0.91 1.49* 1.34* 1.21  CALCIUM 9.0 8.1* 8.0*  --   AST 23  --   --   --   ALT 17  --   --   --   ALKPHOS 97  --   --   --   BILITOT 1.3*  --   --   --    ------------------------------------------------------------------------------------------------------------------ estimated creatinine clearance is 60.5 mL/min (by C-G formula based on Cr of 1.21). ------------------------------------------------------------------------------------------------------------------ No results for input(s): HGBA1C in the last 72 hours. ------------------------------------------------------------------------------------------------------------------ No results for input(s): CHOL, HDL, LDLCALC, TRIG, CHOLHDL, LDLDIRECT in the last 72  hours. ------------------------------------------------------------------------------------------------------------------ No results for input(s): TSH, T4TOTAL, T3FREE, THYROIDAB in the last 72 hours.  Invalid input(s): FREET3 ------------------------------------------------------------------------------------------------------------------ No results for input(s): VITAMINB12, FOLATE, FERRITIN, TIBC, IRON, RETICCTPCT in the last 72 hours.  Coagulation profile  Recent Labs Lab 10/30/15 1331 10/31/15 0540  INR 1.10 1.23    No results for input(s): DDIMER in the last 72 hours.  Cardiac Enzymes  Recent Labs Lab 10/30/15 2253 10/31/15 0540 10/31/15 1135  TROPONINI 0.66* 0.82* 0.71*   ------------------------------------------------------------------------------------------------------------------ Invalid input(s): POCBNP    Assessment & Plan   # PAD Angiogram on 11/04/2015.  # Elevated troponin likely from CHF and Infection. No further workup advised by cardiology. ASA, Statin, BB.  # Acute hypoxic respiratory failure secondary to acute on chronic systolic chf - EF 20-25% Changed lasix to PO- stable respi status. Monitor daily weights and intake and output Continue aspirin, beta blocker and statin  # Right great toe and foot cellulitis with osteomyelitis and bacteremia Staph bacteremia and Providencia in wound. On IV unasyn now.  Podiatry consulted vascular for PAD ID consult appreciated. Vascular planned Angiogram and I& D of right toe today. He may need long course of IV abx - as had osteomyelitis and bacteremia.  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 Shaune Pollack has discussed with on-call ophthalmologist Dr. Duke Salvia, who has recommended outpatient follow-up after discharge  # Diabetes mellitus Sliding scale insulin blood sugars elevated. On glipizide. Added Levemir.   Held metformin due to need for angiogram today.      Code Status Orders        Start     Ordered   10/30/15 2249  Full code   Continuous     10/30/15 2248    Advance Directive Documentation  Most Recent Value   Type of Advance Directive  Healthcare Power of Attorney   Pre-existing out of facility DNR order (yellow form or pink MOST form)     "MOST" Form in Place?        Consults  Cardiology  DVT Prophylaxis  heparin  Lab Results  Component Value Date   PLT 205 11/01/2015     Time Spent in minutes   35 minutes  Altamese Dilling M.D on 11/04/2015 at 10:14 AM  Between 7am to 6pm - Pager - [phone]  After 6pm go to [email or web address] - password [email or web address] [facility]  [facility]   Office  [phone]

## 2015-11-04 NOTE — Progress Notes (Signed)
Notified [place] wellness for PICC placement.

## 2015-11-04 NOTE — Progress Notes (Signed)
RN called to room because tele was disconnected, patient had removed tele, took off clothes, and removed dressing from incision site in left groin. Tele replaced, gown changed, and gauze replaced, pressure held for 10 minutes prior to replacing dressing.

## 2015-11-04 NOTE — Progress Notes (Signed)
Notified MD that per CSW pt is to be d/c with picc line, MD acknowledged.

## 2015-11-04 NOTE — Anesthesia Procedure Notes (Signed)
Date/Time: 11/04/2015 6:59 PM Performed by: Stormy FabianURTIS, Rosibel Giacobbe Pre-anesthesia Checklist: Patient identified, Emergency Drugs available, Suction available and Patient being monitored Patient Re-evaluated:Patient Re-evaluated prior to inductionOxygen Delivery Method: Nasal cannula Intubation Type: IV induction Dental Injury: Teeth and Oropharynx as per pre-operative assessment  Comments: Nasal cannula with etCO2 monitoring

## 2015-11-04 NOTE — Progress Notes (Signed)
Per MD patient will likely not be ready for D/C over the weekend. [facility] aware [facility]. Clinical Social Worker (CSW) will continue to follow and assist as needed.   Jetta LoutBailey Morgan, LCSWA [phone]

## 2015-11-04 NOTE — Op Note (Signed)
    OPERATIVE NOTE   PROCEDURE: Exploration deep tendon space right foot; debridement of skin and subcutaneous tissues and tendons right great toe   PRE-OPERATIVE DIAGNOSIS: Diabetic foot infection; atherosclerotic occlusive disease bilateral lower extremities with ulceration of the right great toe   POST-OPERATIVE DIAGNOSIS: Same  SURGEON: Pate Aylward, Latina CraverGregory G  ASSISTANT(S): None  ANESTHESIA: general  ESTIMATED BLOOD LOSS: 10 cc cc  FINDING(S): Edema fluid in the tendon spaces both plantar and dorsal at the tissues all appear viable no pus encountered. Tissues debrided from the great toe appear necrotic  SPECIMEN(S):  Deep wound culture of the tendon spaces of the right foot  INDICATIONS:   Daniel [name] is a 64 y.o. male who presents with cellulitis with ulceration of the right lower 70. Physical examination demonstrated lack of pulses consistent with atherosclerotic occlusive disease. Earlier today the patient underwent angiography with a multilevel intervention for occlusive disease. He now has proper perfusion of his foot and is undergoing exploration and debridement to ensure he does not have a deep fascial abscess.  DESCRIPTION: After full informed written consent was obtained from the patient, the patient was brought back to the operating room and placed supine upon the operating table.  Prior to induction, the patient received IV antibiotics.   After obtaining adequate anesthesia, the patient was then prepped and draped in the standard fashion for a  right foot debridement.  Linear incision is then created from the lateral aspect of the first metatarsal proximally. Incision is carried down through the soft tissues and the fascia is opened tendons both of the flexor and extensor muscles are exposed they appear normal there is fairly good bleeding noted there is fluid which appears serous but no frank purulence area deep wound cultures obtained from these areas.  The incision  is then closed with interrupted 4-0 nylon vertical mattress sutures.  Using a 15 blade scalpel as well as Metzenbaum scissors the necrotic tissue and the medial and plantar aspect of the great toe is then debrided sharply tissues debrided include skin which is devitalized as well as devitalized subcutaneous tissues and tendons. Area of debridement was approximately 10 x 15 mm in size.  The wound is then dressed with Silvadene and a dry gauze dressing is applied.   The patient tolerated this procedure well.   COMPLICATIONS: None  CONDITION: Juliane PootGood   Lucianna Ostlund G [facility]  Office: [phone]   11/04/2015, 8:15 PM

## 2015-11-04 NOTE — Progress Notes (Signed)
[facility] PROGRESS NOTE Date of Admission:  10/30/2015     ID: Daniel [name] is a 64 y.o. male with MSSA bacteremia, viridan strep bacteremia (possible contaminant), wound infection.    Active Problems:   Unstable angina (HCC)  Subjective: Still foot pain, no fevers, sleepy  ROS  Eleven systems are reviewed and negative except per hpi  Medications:  Antibiotics Given (last 72 hours)    Date/Time Action Medication Dose Rate   11/01/15 1739 Given   Ampicillin-Sulbactam (UNASYN) 3 g in sodium chloride 0.9 % 100 mL IVPB 3 g 100 mL/hr   11/01/15 1756 Given   vancomycin (VANCOCIN) IVPB 1000 mg/200 mL premix 1,000 mg 200 mL/hr   11/01/15 2242 Given   Ampicillin-Sulbactam (UNASYN) 3 g in sodium chloride 0.9 % 100 mL IVPB 3 g 100 mL/hr   11/02/15 0425 Given   vancomycin (VANCOCIN) IVPB 1000 mg/200 mL premix 1,000 mg 200 mL/hr   11/02/15 0543 Given   Ampicillin-Sulbactam (UNASYN) 3 g in sodium chloride 0.9 % 100 mL IVPB 3 g 100 mL/hr   11/02/15 1215 Given   Ampicillin-Sulbactam (UNASYN) 3 g in sodium chloride 0.9 % 100 mL IVPB 3 g 100 mL/hr   11/02/15 1814 Given   [MAR Hold] Ampicillin-Sulbactam (UNASYN) 3 g in sodium chloride 0.9 % 100 mL IVPB (MAR Hold since 11/04/15 1128) 3 g 100 mL/hr   11/02/15 2331 Given   [MAR Hold] Ampicillin-Sulbactam (UNASYN) 3 g in sodium chloride 0.9 % 100 mL IVPB (MAR Hold since 11/04/15 1128) 3 g 100 mL/hr   11/03/15 0506 Given   [MAR Hold] Ampicillin-Sulbactam (UNASYN) 3 g in sodium chloride 0.9 % 100 mL IVPB (MAR Hold since 11/04/15 1128) 3 g 100 mL/hr   11/03/15 1225 Given   [MAR Hold] Ampicillin-Sulbactam (UNASYN) 3 g in sodium chloride 0.9 % 100 mL IVPB (MAR Hold since 11/04/15 1128) 3 g 100 mL/hr   11/03/15 1748 Given   [MAR Hold] Ampicillin-Sulbactam (UNASYN) 3 g in sodium chloride 0.9 % 100 mL IVPB (MAR Hold since 11/04/15 1128) 3 g 100 mL/hr   11/04/15 0002 Given   [MAR Hold] Ampicillin-Sulbactam (UNASYN) 3 g in sodium  chloride 0.9 % 100 mL IVPB (MAR Hold since 11/04/15 1128) 3 g 100 mL/hr   11/04/15 0551 Given   [MAR Hold] Ampicillin-Sulbactam (UNASYN) 3 g in sodium chloride 0.9 % 100 mL IVPB (MAR Hold since 11/04/15 1128) 3 g 100 mL/hr   11/04/15 1209 Given   [MAR Hold] Ampicillin-Sulbactam (UNASYN) 3 g in sodium chloride 0.9 % 100 mL IVPB (MAR Hold since 11/04/15 1128) 3 g 100 mL/hr     . [MAR Hold] ampicillin-sulbactam (UNASYN) IV  3 g Intravenous Q6H  . [MAR Hold] aspirin EC  81 mg Oral Daily  . [MAR Hold] atorvastatin  40 mg Oral QHS  . [MAR Hold] enoxaparin (LOVENOX) injection  40 mg Subcutaneous Q24H  . [MAR Hold] furosemide  40 mg Oral BID  . [MAR Hold] glipiZIDE  10 mg Oral BID AC  . [MAR Hold] insulin aspart  0-9 Units Subcutaneous TID AC & HS  . [MAR Hold] insulin detemir  5 Units Subcutaneous QHS  . [MAR Hold] losartan  100 mg Oral Daily  . [MAR Hold] metoprolol tartrate  25 mg Oral BID  . [MAR Hold] pantoprazole  40 mg Oral QAC breakfast  . [MAR Hold] pregabalin  75 mg Oral BID  . sodium chloride        Objective: Vital signs in last  24 hours: Temp:  [98 F (36.7 C)-98.5 F (36.9 C)] 98.5 F (36.9 C) (12/30 1130) Pulse Rate:  [72-85] 72 (12/30 1431) Resp:  [9-26] 9 (12/30 1431) BP: (124-169)/(68-87) 141/83 mmHg (12/30 1431) SpO2:  [94 %-99 %] 99 % (12/30 1431) FiO2 (%):  [99 %-100 %] 99 % (12/30 1431) Weight:  [81.058 kg (178 lb 11.2 oz)-82.419 kg (181 lb 11.2 oz)] 81.058 kg (178 lb 11.2 oz) (12/30 0526) Constitutional: He is oriented to person, place, and time. dishelved HENT: PERRLA, EOMI, anicteric Mouth/Throat: Oropharynx is clear and moist. No oropharyngeal exudate.  Cardiovascular: Normal rate, regular rhythm and normal heart sounds.  Pulmonary/Chest: Effort normal and breath sounds normal. No respiratory distress. He has no wheezes.  Abdominal: Soft. Bowel sounds are normal. He exhibits no distension. There is no tenderness.  Lymphadenopathy: He has no cervical  adenopathy.  Neurological: He is alert and oriented to person, place, and time.  Skin: R great toe with dark eschar with surrounding redness, has edema and erythema over dorsum of foot to mid foot.  Psychiatric: He has a normal mood and affect. His behavior is normal.   Lab Results  Recent Labs  11/02/15 0549  CREATININE 1.21    Microbiology: Results for orders placed or performed during the hospital encounter of 10/30/15  Culture, blood (routine x 2)     Status: None   Collection Time: 10/30/15  4:44 PM  Result Value Ref Range Status   Specimen Description BLOOD LEFT WRIST  Final   Special Requests BOTTLES DRAWN AEROBIC AND ANAEROBIC  2CC  Final   Culture  Setup Time   Final    GRAM POSITIVE COCCI IN CLUSTERS AEROBIC BOTTLE ONLY CRITICAL RESULT CALLED TO, READ BACK BY AND VERIFIED WITH: JASON ROBBINS AT 0347 ON 10/31/15 CTJ    Culture STAPHYLOCOCCUS AUREUS AEROBIC BOTTLE ONLY   Final   Report Status 11/04/2015 FINAL  Final   Organism ID, Bacteria STAPHYLOCOCCUS AUREUS  Final      Susceptibility   Staphylococcus aureus - MIC*    CIPROFLOXACIN <=0.5 SENSITIVE Sensitive     ERYTHROMYCIN 0.5 SENSITIVE Sensitive     GENTAMICIN <=0.5 SENSITIVE Sensitive     OXACILLIN <=0.25 SENSITIVE Sensitive     TRIMETH/SULFA <=10 SENSITIVE Sensitive     CLINDAMYCIN <=0.25 SENSITIVE Sensitive     CEFOXITIN SCREEN NEGATIVE Sensitive     Inducible Clindamycin NEGATIVE Sensitive     TETRACYCLINE Value in next row Sensitive      SENSITIVE<=1    * STAPHYLOCOCCUS AUREUS  Blood Culture ID Panel (Reflexed)     Status: Abnormal   Collection Time: 10/30/15  4:44 PM  Result Value Ref Range Status   Enterococcus species NOT DETECTED NOT DETECTED Final   Listeria monocytogenes NOT DETECTED NOT DETECTED Final   Staphylococcus species DETECTED (A) NOT DETECTED Corrected    Comment: CORRECTED ON 12/27 AT 0801: PREVIOUSLY REPORTED AS NOT DETECTED   Staphylococcus aureus DETECTED (A) NOT DETECTED Final     Comment: CRITICAL RESULT CALLED TO, READ BACK BY AND VERIFIED WITH: JASON ROBBINS AT 1710 ON 10/31/15 CTJ    Streptococcus species NOT DETECTED NOT DETECTED Final   Streptococcus agalactiae NOT DETECTED NOT DETECTED Final   Streptococcus pneumoniae NOT DETECTED NOT DETECTED Final   Streptococcus pyogenes NOT DETECTED NOT DETECTED Final   Acinetobacter baumannii NOT DETECTED NOT DETECTED Final   Enterobacteriaceae species NOT DETECTED NOT DETECTED Final   Enterobacter cloacae complex NOT DETECTED NOT DETECTED Final   Escherichia  coli NOT DETECTED NOT DETECTED Final   Klebsiella oxytoca NOT DETECTED NOT DETECTED Final   Klebsiella pneumoniae NOT DETECTED NOT DETECTED Final   Proteus species NOT DETECTED NOT DETECTED Final   Serratia marcescens NOT DETECTED NOT DETECTED Final   Haemophilus influenzae NOT DETECTED NOT DETECTED Final   Neisseria meningitidis NOT DETECTED NOT DETECTED Final   Pseudomonas aeruginosa NOT DETECTED NOT DETECTED Final   Candida albicans NOT DETECTED NOT DETECTED Final   Candida glabrata NOT DETECTED NOT DETECTED Final   Candida krusei NOT DETECTED NOT DETECTED Final   Candida parapsilosis NOT DETECTED NOT DETECTED Final   Candida tropicalis NOT DETECTED NOT DETECTED Final   Carbapenem resistance NOT DETECTED NOT DETECTED Final   Methicillin resistance NOT DETECTED NOT DETECTED Final   Vancomycin resistance NOT DETECTED NOT DETECTED Final  Culture, blood (routine x 2)     Status: None   Collection Time: 10/30/15  5:31 PM  Result Value Ref Range Status   Specimen Description BLOOD LEFT HAND  Final   Special Requests BOTTLES DRAWN AEROBIC AND ANAEROBIC  2CC  Final   Culture  Setup Time   Final    GRAM POSITIVE COCCI IN CHAINS IN BOTH AEROBIC AND ANAEROBIC BOTTLES CRITICAL RESULT CALLED TO, READ BACK BY AND VERIFIED WITH: KAREN HAYES AT 6270 10/31/15 CTJ    Culture   Final    VIRIDANS STREPTOCOCCUS IN BOTH AEROBIC AND ANAEROBIC BOTTLES MULTIPLE SPECIES  PRESENT Results consistent with contamination.    Report Status 11/02/2015 FINAL  Final  Blood Culture ID Panel (Reflexed)     Status: Abnormal   Collection Time: 10/30/15  5:31 PM  Result Value Ref Range Status   Enterococcus species NOT DETECTED NOT DETECTED Final   Listeria monocytogenes NOT DETECTED NOT DETECTED Final   Staphylococcus species NOT DETECTED NOT DETECTED Final   Staphylococcus aureus NOT DETECTED NOT DETECTED Final   Streptococcus species DETECTED (A) NOT DETECTED Final    Comment: CRITICAL RESULT CALLED TO, READ BACK BY AND VERIFIED WITH: KAREN HAYES AT 1045 ON 10/31/15 CTJ    Streptococcus agalactiae NOT DETECTED NOT DETECTED Final   Streptococcus pneumoniae NOT DETECTED NOT DETECTED Final   Streptococcus pyogenes NOT DETECTED NOT DETECTED Final   Acinetobacter baumannii NOT DETECTED NOT DETECTED Final   Enterobacteriaceae species NOT DETECTED NOT DETECTED Final   Enterobacter cloacae complex NOT DETECTED NOT DETECTED Final   Escherichia coli NOT DETECTED NOT DETECTED Final   Klebsiella oxytoca NOT DETECTED NOT DETECTED Final   Klebsiella pneumoniae NOT DETECTED NOT DETECTED Final   Proteus species NOT DETECTED NOT DETECTED Final   Serratia marcescens NOT DETECTED NOT DETECTED Final   Haemophilus influenzae NOT DETECTED NOT DETECTED Final   Neisseria meningitidis NOT DETECTED NOT DETECTED Final   Pseudomonas aeruginosa NOT DETECTED NOT DETECTED Final   Candida albicans NOT DETECTED NOT DETECTED Final   Candida glabrata NOT DETECTED NOT DETECTED Final   Candida krusei NOT DETECTED NOT DETECTED Final   Candida parapsilosis NOT DETECTED NOT DETECTED Final   Candida tropicalis NOT DETECTED NOT DETECTED Final   Carbapenem resistance NOT DETECTED NOT DETECTED Final   Methicillin resistance NOT DETECTED NOT DETECTED Final   Vancomycin resistance NOT DETECTED NOT DETECTED Final  CULTURE, BLOOD (ROUTINE X 2) w Reflex to PCR ID Panel     Status: None (Preliminary  result)   Collection Time: 10/31/15 11:35 AM  Result Value Ref Range Status   Specimen Description BLOOD RIGHT HAND  Final  Special Requests BOTTLES DRAWN AEROBIC AND ANAEROBIC 2CC  Final   Culture NO GROWTH 3 DAYS  Final   Report Status PENDING  Incomplete  CULTURE, BLOOD (ROUTINE X 2) w Reflex to PCR ID Panel     Status: None (Preliminary result)   Collection Time: 10/31/15 11:35 AM  Result Value Ref Range Status   Specimen Description BLOOD RIGHT ASSIST CONTROL  Final   Special Requests BOTTLES DRAWN AEROBIC AND ANAEROBIC 2CC  Final   Culture NO GROWTH 3 DAYS  Final   Report Status PENDING  Incomplete  Wound culture     Status: None   Collection Time: 10/31/15  2:12 PM  Result Value Ref Range Status   Specimen Description ULCER  Final   Special Requests NONE  Final   Gram Stain   Final    RARE WBC SEEN MODERATE GRAM POSITIVE COCCI IN PAIRS IN CLUSTERS RARE GRAM NEGATIVE RODS RARE GRAM POSITIVE RODS    Culture   Final    MODERATE GROWTH PROVIDENCIA RETTGERI HEAVY GROWTH STAPHYLOCOCCUS AUREUS    Report Status 11/03/2015 FINAL  Final   Organism ID, Bacteria PROVIDENCIA RETTGERI  Final   Organism ID, Bacteria STAPHYLOCOCCUS AUREUS  Final      Susceptibility   Staphylococcus aureus - MIC*    CIPROFLOXACIN <=0.5 SENSITIVE Sensitive     ERYTHROMYCIN <=0.25 SENSITIVE Sensitive     GENTAMICIN <=0.5 SENSITIVE Sensitive     OXACILLIN <=0.25 SENSITIVE Sensitive     TRIMETH/SULFA <=10 SENSITIVE Sensitive     CLINDAMYCIN <=0.25 SENSITIVE Sensitive     CEFOXITIN SCREEN NEGATIVE Sensitive     Inducible Clindamycin NEGATIVE Sensitive     TETRACYCLINE Value in next row Sensitive      SENSITIVE<=1    * HEAVY GROWTH STAPHYLOCOCCUS AUREUS   Providencia rettgeri - MIC*    AMPICILLIN Value in next row Resistant      SENSITIVE<=1    CEFTAZIDIME Value in next row Sensitive      SENSITIVE<=1    CEFAZOLIN Value in next row Resistant      SENSITIVE<=1    CEFTRIAXONE Value in next row  Sensitive      SENSITIVE<=1    CIPROFLOXACIN Value in next row Sensitive      SENSITIVE<=1    GENTAMICIN Value in next row Sensitive      SENSITIVE<=1    IMIPENEM Value in next row Sensitive      SENSITIVE<=1    TRIMETH/SULFA Value in next row Resistant      SENSITIVE<=1    PIP/TAZO Value in next row Sensitive      SENSITIVE<=4    AMPICILLIN/SULBACTAM Value in next row Sensitive      SENSITIVE<=2    * MODERATE GROWTH PROVIDENCIA RETTGERI  Culture, blood (Routine X 2) w Reflex to ID Panel     Status: None (Preliminary result)   Collection Time: 11/01/15  5:07 PM  Result Value Ref Range Status   Specimen Description BLOOD RIGHT ASSIST CONTROL  Final   Special Requests BOTTLES DRAWN AEROBIC AND ANAEROBIC 5CC  Final   Culture NO GROWTH 2 DAYS  Final   Report Status PENDING  Incomplete  Culture, blood (Routine X 2) w Reflex to ID Panel     Status: None (Preliminary result)   Collection Time: 11/01/15  5:07 PM  Result Value Ref Range Status   Specimen Description BLOOD RIGHT HAND  Final   Special Requests BOTTLES DRAWN AEROBIC AND ANAEROBIC 3CC  Final   Culture  NO GROWTH 2 DAYS  Final   Report Status PENDING  Incomplete    Studies/Results: No results found. ECHO  Study Conclusions  - Left ventricle: The cavity size was severely dilated. Systolic function was severely reduced. The estimated ejection fraction was in the range of 20% to 25%. Diffuse hypokinesis. - Aortic valve: Valve area (Vmax): 2.05 cm^2. - Mitral valve: There was mild regurgitation. - Left atrium: The atrium was mildly dilated. - Right atrium: The atrium was mildly dilated.  Assessment/Plan:  Daniel [name] is a 64 y.o. 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 Wound cx with heavy growth staph aureus and mod growth Providenceia. A1c 12.2. Vascular eval  pending. Denies smoking.  ESR 18, CRP pending. HIV NR.  FU BCX 12/27 NGTD Echo with EF 20-25%, no vegetations noted  Recommendations Dc vanco Cont  unasyn (I confirmed with micro that the providencia is sensitive) Will need at least 2 weeks IV abx for the S aureus bacteremia but I suspect will need 6 week total IV abx for the osteomyelitis. If bcx from 12/27 are negative can place picc 12/29. He seems to be unable to care for himself and now has some decreased vision- may benefit from SNF for IV abx  Thank you very much for the consult. Will follow with you.  [place], [place]   11/04/2015, 2:52 PM

## 2015-11-04 NOTE — Progress Notes (Signed)
F/u right great toe ulceration. Erythema continues to progress. Scheduled for re-vascularization and I & D by vasc surgery today. Will continue to follow and may need great toe amputation in future.

## 2015-11-04 NOTE — Anesthesia Postprocedure Evaluation (Signed)
Anesthesia Post Note  Patient: Daniel [name]  Procedure(s) Performed: Procedure(s) (LRB): DEBRIDEMENT WOUND (Right)  Patient location during evaluation: PACU Anesthesia Type: General Level of consciousness: awake and alert Pain management: pain level controlled Vital Signs Assessment: post-procedure vital signs reviewed and stable Respiratory status: spontaneous breathing and respiratory function stable Cardiovascular status: stable Anesthetic complications: no    Last Vitals:  Filed Vitals:   11/04/15 1534 11/04/15 2009  BP: 146/70 144/81  Pulse: 71 84  Temp: 36.8 C 36.4 C  Resp:  12    Last Pain:  Filed Vitals:   11/04/15 2011  PainSc: Asleep                 KEPHART,WILLIAM K

## 2015-11-05 ENCOUNTER — Inpatient Hospital Stay: Payer: Commercial Managed Care - HMO

## 2015-11-05 LAB — CULTURE, BLOOD (ROUTINE X 2)
CULTURE: NO GROWTH
CULTURE: NO GROWTH

## 2015-11-05 LAB — GLUCOSE, CAPILLARY
GLUCOSE-CAPILLARY: 201 mg/dL — AB (ref 65–99)
GLUCOSE-CAPILLARY: 215 mg/dL — AB (ref 65–99)
Glucose-Capillary: 157 mg/dL — ABNORMAL HIGH (ref 65–99)
Glucose-Capillary: 121 mg/dL — ABNORMAL HIGH (ref 65–99)

## 2015-11-05 NOTE — Progress Notes (Signed)
Subjective  - POD #1  Resting comfortably   Physical Exam:  Foot is warm Erythema appears unchanged Dressing changed earlier today       Assessment/Plan:  POD #1  Continue IV abx Further management of foot per podiatry  Durene Cal 11/05/2015 2:14 PM --  Filed Vitals:   11/05/15 0838 11/05/15 1143  BP: 157/91 136/78  Pulse: 75 70  Temp: 98 F (36.7 C) 98.1 F (36.7 C)  Resp: 18 18    Intake/Output Summary (Last 24 hours) at 11/05/15 1414 Last data filed at 11/05/15 0830  Gross per 24 hour  Intake    840 ml  Output   1300 ml  Net   -460 ml     Laboratory CBC    Component Value Date/Time   WBC 10.2 11/01/2015 0247   WBC 7.8 09/01/2014 0904   HGB 12.1* 11/01/2015 0247   HGB 16.1 09/01/2014 0904   HCT 36.1* 11/01/2015 0247   HCT 48.6 09/01/2014 0904   PLT 205 11/01/2015 0247   PLT 188 09/01/2014 0904    BMET    Component Value Date/Time   NA 137 11/01/2015 0247   NA 137 09/01/2014 0904   K 3.5 11/01/2015 0247   K 3.9 09/01/2014 0904   CL 99* 11/01/2015 0247   CL 103 09/01/2014 0904   CO2 26 11/01/2015 0247   CO2 24 09/01/2014 0904   GLUCOSE 187* 11/01/2015 0247   GLUCOSE 321* 09/01/2014 0904   BUN 41* 11/01/2015 0247   BUN 15 09/01/2014 0904   CREATININE 1.21 11/02/2015 0549   CREATININE 0.96 09/01/2014 0904   CALCIUM 8.0* 11/01/2015 0247   CALCIUM 8.1* 09/01/2014 0904   GFRNONAA >60 11/02/2015 0549   GFRNONAA >60 09/01/2014 0904   GFRNONAA 58* 04/23/2012 1940   GFRAA >60 11/02/2015 0549   GFRAA >60 09/01/2014 0904   GFRAA >60 04/23/2012 1940    COAG Lab Results  Component Value Date   INR 1.23 10/31/2015   INR 1.10 10/30/2015   No results found for: PTT  Antibiotics Anti-infectives    Start     Dose/Rate Route Frequency Ordered Stop   11/02/15 1800  Ampicillin-Sulbactam (UNASYN) 3 g in sodium chloride 0.9 % 100 mL IVPB     3 g 100 mL/hr over 60 Minutes Intravenous Every 6 hours 11/02/15 1522     11/02/15 1400   piperacillin-tazobactam (ZOSYN) IVPB 3.375 g  Status:  Discontinued     3.375 g 12.5 mL/hr over 240 Minutes Intravenous 3 times per day 11/02/15 1350 11/02/15 1444   10/31/15 1130  Ampicillin-Sulbactam (UNASYN) 3 g in sodium chloride 0.9 % 100 mL IVPB  Status:  Discontinued     3 g 100 mL/hr over 60 Minutes Intravenous Every 6 hours 10/31/15 1035 11/02/15 1334   10/31/15 0500  vancomycin (VANCOCIN) IVPB 1000 mg/200 mL premix  Status:  Discontinued     1,000 mg 200 mL/hr over 60 Minutes Intravenous Every 12 hours 10/30/15 2248 11/02/15 1333   10/30/15 2130  vancomycin (VANCOCIN) IVPB 1000 mg/200 mL premix  Status:  Discontinued     1,000 mg 200 mL/hr over 60 Minutes Intravenous  Once 10/30/15 2120 10/30/15 2141   10/30/15 1645  piperacillin-tazobactam (ZOSYN) IVPB 3.375 g     3.375 g 100 mL/hr over 30 Minutes Intravenous  Once 10/30/15 1633 10/30/15 2019   10/30/15 1645  vancomycin (VANCOCIN) IVPB 1000 mg/200 mL premix     1,000 mg 200 mL/hr over 60 Minutes  Intravenous  Once 10/30/15 1633 10/30/15 2019       V. Charlena CrossWells Brabham IV, M.D. [facility] Office: [phone] Pager:  [phone]

## 2015-11-06 ENCOUNTER — Encounter: Payer: Self-pay | Admitting: Vascular Surgery

## 2015-11-06 LAB — BASIC METABOLIC PANEL
Anion gap: 8 (ref 5–15)
BUN: 23 mg/dL — AB (ref 6–20)
CO2: 31 mmol/L (ref 22–32)
CREATININE: 0.79 mg/dL (ref 0.61–1.24)
Calcium: 8.2 mg/dL — ABNORMAL LOW (ref 8.9–10.3)
Chloride: 101 mmol/L (ref 101–111)
Glucose, Bld: 153 mg/dL — ABNORMAL HIGH (ref 65–99)
POTASSIUM: 2.7 mmol/L — AB (ref 3.5–5.1)
SODIUM: 140 mmol/L (ref 135–145)

## 2015-11-06 LAB — CBC
HEMATOCRIT: 34.5 % — AB (ref 40.0–52.0)
HEMOGLOBIN: 11.5 g/dL — AB (ref 13.0–18.0)
MCH: 29.3 pg (ref 26.0–34.0)
MCHC: 33.4 g/dL (ref 32.0–36.0)
MCV: 87.8 fL (ref 80.0–100.0)
Platelets: 249 10*3/uL (ref 150–440)
RBC: 3.93 MIL/uL — AB (ref 4.40–5.90)
RDW: 14.5 % (ref 11.5–14.5)
WBC: 8.6 10*3/uL (ref 3.8–10.6)

## 2015-11-06 LAB — CULTURE, BLOOD (ROUTINE X 2)
CULTURE: NO GROWTH
Culture: NO GROWTH

## 2015-11-06 LAB — GLUCOSE, CAPILLARY
GLUCOSE-CAPILLARY: 228 mg/dL — AB (ref 65–99)
GLUCOSE-CAPILLARY: 253 mg/dL — AB (ref 65–99)
Glucose-Capillary: 139 mg/dL — ABNORMAL HIGH (ref 65–99)
Glucose-Capillary: 209 mg/dL — ABNORMAL HIGH (ref 65–99)

## 2015-11-06 LAB — POTASSIUM: POTASSIUM: 3.8 mmol/L (ref 3.5–5.1)

## 2015-11-06 LAB — MAGNESIUM: Magnesium: 1.7 mg/dL (ref 1.7–2.4)

## 2015-11-06 MED ORDER — POTASSIUM CHLORIDE CRYS ER 20 MEQ PO TBCR
40.0000 meq | EXTENDED_RELEASE_TABLET | ORAL | Status: AC
  Administered 2015-11-06 (×2): 40 meq via ORAL
  Filled 2015-11-06 (×2): qty 2

## 2015-11-06 MED ORDER — POTASSIUM CHLORIDE 10 MEQ/100ML IV SOLN
10.0000 meq | INTRAVENOUS | Status: AC
  Administered 2015-11-06 (×2): 10 meq via INTRAVENOUS
  Filled 2015-11-06 (×2): qty 100

## 2015-11-06 MED ORDER — POTASSIUM CHLORIDE CRYS ER 20 MEQ PO TBCR
40.0000 meq | EXTENDED_RELEASE_TABLET | Freq: Two times a day (BID) | ORAL | Status: DC
  Administered 2015-11-06 – 2015-11-10 (×7): 40 meq via ORAL
  Filled 2015-11-06 (×7): qty 2

## 2015-11-06 NOTE — Progress Notes (Signed)
[facility] [place] [facility]                                                                                                                                                                                            Patient Demographics   Daniel [name], is a 65 y.o. male, DOB - 10/09/1951, ZOX:[id number]  Admit date - 10/30/2015   Admitting Physician Ramonita Lab, MD  Outpatient Primary MD for the patient is [name],[name], MD   LOS - 7  Subjective: Patient admitted with shortness of breath,  right toe erythema and pain, also left sided new eye visual difficulties. Continues to have pain right foot. SOB improved. No CP. Afebrile. Off Oxygen  Angio and I& D of right toe is done 11/04/15, less pain. As per podiatry will need amputation of toe.  Review of Systems:   CONSTITUTIONAL: No documented fever. No fatigue, weakness. No weight gain, no weight loss.  EYES: No blurry or double vision. Left eye visual difficulties ENT: No tinnitus. No postnasal drip. No redness of the oropharynx.  RESPIRATORY: No cough, no wheeze, no hemoptysis. Positive dyspnea.  CARDIOVASCULAR: No chest pain. No orthopnea. No palpitations. No syncope.  GASTROINTESTINAL: No nausea, no vomiting or diarrhea. No abdominal pain. No melena or hematochezia.  GENITOURINARY: No dysuria or hematuria.  ENDOCRINE: No polyuria or nocturia. No heat or cold intolerance.  HEMATOLOGY: No anemia. No bruising. No bleeding.  INTEGUMENTARY: No rashes. No lesions.  MUSCULOSKELETAL: No arthritis. No swelling. No gout. Right foot pain. NEUROLOGIC: No numbness, tingling, or ataxia. No seizure-type activity. Generalized weakness PSYCHIATRIC: No anxiety. No insomnia. No ADD.     Vitals:   Filed Vitals:   11/05/15 2006 11/06/15 0557 11/06/15 0831 11/06/15 1102  BP: 177/100 146/83 165/74 157/89  Pulse: 96 72 81 76  Temp: 98.2 F (36.8 C) 98.5 F (36.9 C) 98.7 F (37.1 C) 98 F (36.7 C)   TempSrc: Oral Oral Oral Oral  Resp: 18 18 17 18   Height:      Weight:  78.427 kg (172 lb 14.4 oz)    SpO2: 99% 91% 98% 95%    Wt Readings from Last 3 Encounters:  11/06/15 78.427 kg (172 lb 14.4 oz)  04/27/15 74.844 kg (165 lb)     Intake/Output Summary (Last 24 hours) at 11/06/15 1535 Last data filed at 11/06/15 1300  Gross per 24 hour  Intake    340 ml  Output   1250 ml  Net   -910 ml    Physical Exam:   GENERAL: Pleasant-appearing in no apparent distress.  HEAD, EYES, EARS, NOSE AND  THROAT: Atraumatic, normocephalic. Extraocular muscles are intact. Pupils equal and reactive to light. Sclerae anicteric. No conjunctival injection. No oro-pharyngeal erythema.  NECK: Supple. There is no jugular venous distention. No bruits, no lymphadenopathy, no thyromegaly.  HEART: Regular rate and rhythm,. No murmurs, no rubs, no clicks.  LUNGS: Bilateral crackles at the bases ABDOMEN: Soft, flat, nontender, nondistended. Has good bowel sounds. No hepatosplenomegaly appreciated.  EXTREMITIES: right foot redenss, with cynosis on great toe, with tenderness and swelling on whole foot. NEUROLOGIC: The patient is alert, awake, and oriented x3 with no focal motor deficits appreciated bilaterally. Decreased sensations feet. SKIN: Right great toe erythematous surrounding cellulitis- dressing present on right foot. Psych: Not anxious, depressed LN: No inguinal LN enlargement Right foot redness with gangrene great toe.   Antibiotics   Anti-infectives    Start     Dose/Rate Route Frequency Ordered Stop   11/02/15 1800  Ampicillin-Sulbactam (UNASYN) 3 g in sodium chloride 0.9 % 100 mL IVPB     3 g 100 mL/hr over 60 Minutes Intravenous Every 6 hours 11/02/15 1522     11/02/15 1400  piperacillin-tazobactam (ZOSYN) IVPB 3.375 g  Status:  Discontinued     3.375 g 12.5 mL/hr over 240 Minutes Intravenous 3 times per day 11/02/15 1350 11/02/15 1444   10/31/15 1130  Ampicillin-Sulbactam (UNASYN) 3 g in  sodium chloride 0.9 % 100 mL IVPB  Status:  Discontinued     3 g 100 mL/hr over 60 Minutes Intravenous Every 6 hours 10/31/15 1035 11/02/15 1334   10/31/15 0500  vancomycin (VANCOCIN) IVPB 1000 mg/200 mL premix  Status:  Discontinued     1,000 mg 200 mL/hr over 60 Minutes Intravenous Every 12 hours 10/30/15 2248 11/02/15 1333   10/30/15 2130  vancomycin (VANCOCIN) IVPB 1000 mg/200 mL premix  Status:  Discontinued     1,000 mg 200 mL/hr over 60 Minutes Intravenous  Once 10/30/15 2120 10/30/15 2141   10/30/15 1645  piperacillin-tazobactam (ZOSYN) IVPB 3.375 g     3.375 g 100 mL/hr over 30 Minutes Intravenous  Once 10/30/15 1633 10/30/15 2019   10/30/15 1645  vancomycin (VANCOCIN) IVPB 1000 mg/200 mL premix     1,000 mg 200 mL/hr over 60 Minutes Intravenous  Once 10/30/15 1633 10/30/15 2019      Medications   Scheduled Meds: . ampicillin-sulbactam (UNASYN) IV  3 g Intravenous Q6H  . aspirin EC  81 mg Oral Daily  . atorvastatin  40 mg Oral QHS  . enoxaparin (LOVENOX) injection  40 mg Subcutaneous Q24H  . furosemide  40 mg Oral BID  . glipiZIDE  10 mg Oral BID AC  . insulin aspart  0-9 Units Subcutaneous TID AC & HS  . insulin detemir  5 Units Subcutaneous QHS  . losartan  100 mg Oral Daily  . metoprolol tartrate  25 mg Oral BID  . pantoprazole  40 mg Oral QAC breakfast  . potassium chloride  40 mEq Oral BID  . pregabalin  75 mg Oral BID   Continuous Infusions:   PRN Meds:.acetaminophen, nitroGLYCERIN, ondansetron (ZOFRAN) IV, oxyCODONE   Data Review:   Micro Results Recent Results (from the past 240 hour(s))  Culture, blood (routine x 2)     Status: None   Collection Time: 10/30/15  4:44 PM  Result Value Ref Range Status   Specimen Description BLOOD LEFT WRIST  Final   Special Requests BOTTLES DRAWN AEROBIC AND ANAEROBIC  2CC  Final   Culture  Setup Time   Final  GRAM POSITIVE COCCI IN CLUSTERS AEROBIC BOTTLE ONLY CRITICAL RESULT CALLED TO, READ BACK BY AND VERIFIED  WITH: JASON ROBBINS AT 1710 ON 10/31/15 CTJ    Culture STAPHYLOCOCCUS AUREUS AEROBIC BOTTLE ONLY   Final   Report Status 11/04/2015 FINAL  Final   Organism ID, Bacteria STAPHYLOCOCCUS AUREUS  Final      Susceptibility   Staphylococcus aureus - MIC*    CIPROFLOXACIN <=0.5 SENSITIVE Sensitive     ERYTHROMYCIN 0.5 SENSITIVE Sensitive     GENTAMICIN <=0.5 SENSITIVE Sensitive     OXACILLIN <=0.25 SENSITIVE Sensitive     TRIMETH/SULFA <=10 SENSITIVE Sensitive     CLINDAMYCIN <=0.25 SENSITIVE Sensitive     CEFOXITIN SCREEN NEGATIVE Sensitive     Inducible Clindamycin NEGATIVE Sensitive     TETRACYCLINE Value in next row Sensitive      SENSITIVE<=1    * STAPHYLOCOCCUS AUREUS  Blood Culture ID Panel (Reflexed)     Status: Abnormal   Collection Time: 10/30/15  4:44 PM  Result Value Ref Range Status   Enterococcus species NOT DETECTED NOT DETECTED Final   Listeria monocytogenes NOT DETECTED NOT DETECTED Final   Staphylococcus species DETECTED (A) NOT DETECTED Corrected    Comment: CORRECTED ON 12/27 AT 0801: PREVIOUSLY REPORTED AS NOT DETECTED   Staphylococcus aureus DETECTED (A) NOT DETECTED Final    Comment: CRITICAL RESULT CALLED TO, READ BACK BY AND VERIFIED WITH: JASON ROBBINS AT 1710 ON 10/31/15 CTJ    Streptococcus species NOT DETECTED NOT DETECTED Final   Streptococcus agalactiae NOT DETECTED NOT DETECTED Final   Streptococcus pneumoniae NOT DETECTED NOT DETECTED Final   Streptococcus pyogenes NOT DETECTED NOT DETECTED Final   Acinetobacter baumannii NOT DETECTED NOT DETECTED Final   Enterobacteriaceae species NOT DETECTED NOT DETECTED Final   Enterobacter cloacae complex NOT DETECTED NOT DETECTED Final   Escherichia coli NOT DETECTED NOT DETECTED Final   Klebsiella oxytoca NOT DETECTED NOT DETECTED Final   Klebsiella pneumoniae NOT DETECTED NOT DETECTED Final   Proteus species NOT DETECTED NOT DETECTED Final   Serratia marcescens NOT DETECTED NOT DETECTED Final   Haemophilus  influenzae NOT DETECTED NOT DETECTED Final   Neisseria meningitidis NOT DETECTED NOT DETECTED Final   Pseudomonas aeruginosa NOT DETECTED NOT DETECTED Final   Candida albicans NOT DETECTED NOT DETECTED Final   Candida glabrata NOT DETECTED NOT DETECTED Final   Candida krusei NOT DETECTED NOT DETECTED Final   Candida parapsilosis NOT DETECTED NOT DETECTED Final   Candida tropicalis NOT DETECTED NOT DETECTED Final   Carbapenem resistance NOT DETECTED NOT DETECTED Final   Methicillin resistance NOT DETECTED NOT DETECTED Final   Vancomycin resistance NOT DETECTED NOT DETECTED Final  Culture, blood (routine x 2)     Status: None   Collection Time: 10/30/15  5:31 PM  Result Value Ref Range Status   Specimen Description BLOOD LEFT HAND  Final   Special Requests BOTTLES DRAWN AEROBIC AND ANAEROBIC  2CC  Final   Culture  Setup Time   Final    GRAM POSITIVE COCCI IN CHAINS IN BOTH AEROBIC AND ANAEROBIC BOTTLES CRITICAL RESULT CALLED TO, READ BACK BY AND VERIFIED WITH: KAREN HAYES AT 1045 10/31/15 CTJ    Culture   Final    VIRIDANS STREPTOCOCCUS IN BOTH AEROBIC AND ANAEROBIC BOTTLES MULTIPLE SPECIES PRESENT Results consistent with contamination.    Report Status 11/02/2015 FINAL  Final  Blood Culture ID Panel (Reflexed)     Status: Abnormal   Collection Time: 10/30/15  5:31 PM  Result Value Ref Range Status   Enterococcus species NOT DETECTED NOT DETECTED Final   Listeria monocytogenes NOT DETECTED NOT DETECTED Final   Staphylococcus species NOT DETECTED NOT DETECTED Final   Staphylococcus aureus NOT DETECTED NOT DETECTED Final   Streptococcus species DETECTED (A) NOT DETECTED Final    Comment: CRITICAL RESULT CALLED TO, READ BACK BY AND VERIFIED WITH: KAREN HAYES AT 1045 ON 10/31/15 CTJ    Streptococcus agalactiae NOT DETECTED NOT DETECTED Final   Streptococcus pneumoniae NOT DETECTED NOT DETECTED Final   Streptococcus pyogenes NOT DETECTED NOT DETECTED Final   Acinetobacter baumannii  NOT DETECTED NOT DETECTED Final   Enterobacteriaceae species NOT DETECTED NOT DETECTED Final   Enterobacter cloacae complex NOT DETECTED NOT DETECTED Final   Escherichia coli NOT DETECTED NOT DETECTED Final   Klebsiella oxytoca NOT DETECTED NOT DETECTED Final   Klebsiella pneumoniae NOT DETECTED NOT DETECTED Final   Proteus species NOT DETECTED NOT DETECTED Final   Serratia marcescens NOT DETECTED NOT DETECTED Final   Haemophilus influenzae NOT DETECTED NOT DETECTED Final   Neisseria meningitidis NOT DETECTED NOT DETECTED Final   Pseudomonas aeruginosa NOT DETECTED NOT DETECTED Final   Candida albicans NOT DETECTED NOT DETECTED Final   Candida glabrata NOT DETECTED NOT DETECTED Final   Candida krusei NOT DETECTED NOT DETECTED Final   Candida parapsilosis NOT DETECTED NOT DETECTED Final   Candida tropicalis NOT DETECTED NOT DETECTED Final   Carbapenem resistance NOT DETECTED NOT DETECTED Final   Methicillin resistance NOT DETECTED NOT DETECTED Final   Vancomycin resistance NOT DETECTED NOT DETECTED Final  CULTURE, BLOOD (ROUTINE X 2) w Reflex to PCR ID Panel     Status: None   Collection Time: 10/31/15 11:35 AM  Result Value Ref Range Status   Specimen Description BLOOD RIGHT HAND  Final   Special Requests BOTTLES DRAWN AEROBIC AND ANAEROBIC 2CC  Final   Culture NO GROWTH 5 DAYS  Final   Report Status 11/05/2015 FINAL  Final  CULTURE, BLOOD (ROUTINE X 2) w Reflex to PCR ID Panel     Status: None   Collection Time: 10/31/15 11:35 AM  Result Value Ref Range Status   Specimen Description BLOOD RIGHT ASSIST CONTROL  Final   Special Requests BOTTLES DRAWN AEROBIC AND ANAEROBIC 2CC  Final   Culture NO GROWTH 5 DAYS  Final   Report Status 11/05/2015 FINAL  Final  Wound culture     Status: None   Collection Time: 10/31/15  2:12 PM  Result Value Ref Range Status   Specimen Description ULCER  Final   Special Requests NONE  Final   Gram Stain   Final    RARE WBC SEEN MODERATE GRAM  POSITIVE COCCI IN PAIRS IN CLUSTERS RARE GRAM NEGATIVE RODS RARE GRAM POSITIVE RODS    Culture   Final    MODERATE GROWTH PROVIDENCIA RETTGERI HEAVY GROWTH STAPHYLOCOCCUS AUREUS    Report Status 11/03/2015 FINAL  Final   Organism ID, Bacteria PROVIDENCIA RETTGERI  Final   Organism ID, Bacteria STAPHYLOCOCCUS AUREUS  Final      Susceptibility   Staphylococcus aureus - MIC*    CIPROFLOXACIN <=0.5 SENSITIVE Sensitive     ERYTHROMYCIN <=0.25 SENSITIVE Sensitive     GENTAMICIN <=0.5 SENSITIVE Sensitive     OXACILLIN <=0.25 SENSITIVE Sensitive     TRIMETH/SULFA <=10 SENSITIVE Sensitive     CLINDAMYCIN <=0.25 SENSITIVE Sensitive     CEFOXITIN SCREEN NEGATIVE Sensitive     Inducible Clindamycin NEGATIVE Sensitive  TETRACYCLINE Value in next row Sensitive      SENSITIVE<=1    * HEAVY GROWTH STAPHYLOCOCCUS AUREUS   Providencia rettgeri - MIC*    AMPICILLIN Value in next row Resistant      SENSITIVE<=1    CEFTAZIDIME Value in next row Sensitive      SENSITIVE<=1    CEFAZOLIN Value in next row Resistant      SENSITIVE<=1    CEFTRIAXONE Value in next row Sensitive      SENSITIVE<=1    CIPROFLOXACIN Value in next row Sensitive      SENSITIVE<=1    GENTAMICIN Value in next row Sensitive      SENSITIVE<=1    IMIPENEM Value in next row Sensitive      SENSITIVE<=1    TRIMETH/SULFA Value in next row Resistant      SENSITIVE<=1    PIP/TAZO Value in next row Sensitive      SENSITIVE<=4    AMPICILLIN/SULBACTAM Value in next row Sensitive      SENSITIVE<=2    * MODERATE GROWTH PROVIDENCIA RETTGERI  Culture, blood (Routine X 2) w Reflex to ID Panel     Status: None   Collection Time: 11/01/15  5:07 PM  Result Value Ref Range Status   Specimen Description BLOOD RIGHT ASSIST CONTROL  Final   Special Requests BOTTLES DRAWN AEROBIC AND ANAEROBIC 5CC  Final   Culture NO GROWTH 5 DAYS  Final   Report Status 11/06/2015 FINAL  Final  Culture, blood (Routine X 2) w Reflex to ID Panel      Status: None   Collection Time: 11/01/15  5:07 PM  Result Value Ref Range Status   Specimen Description BLOOD RIGHT HAND  Final   Special Requests BOTTLES DRAWN AEROBIC AND ANAEROBIC 3CC  Final   Culture NO GROWTH 5 DAYS  Final   Report Status 11/06/2015 FINAL  Final  Anaerobic culture     Status: None (Preliminary result)   Collection Time: 11/04/15  7:20 PM  Result Value Ref Range Status   Specimen Description TOE RIGHT GREAT TOE  Final   Special Requests NONE  Final   Culture   Final    NO ANAEROBES ISOLATED; CULTURE IN PROGRESS FOR 5 DAYS   Report Status PENDING  Incomplete  Wound culture     Status: None (Preliminary result)   Collection Time: 11/04/15  7:20 PM  Result Value Ref Range Status   Specimen Description WOUND RIGHT GREAT TOE  Final   Special Requests NONE  Final   Gram Stain RARE WBC SEEN NO ORGANISMS SEEN   Final   Culture NO GROWTH 2 DAYS  Final   Report Status PENDING  Incomplete    Radiology Reports Ct Head Wo Contrast  10/30/2015  CLINICAL DATA:  Generalized weakness, diabetes, hypertension, right visual disturbance EXAM: CT HEAD WITHOUT CONTRAST TECHNIQUE: Contiguous axial images were obtained from the base of the skull through the vertex without contrast. COMPARISON:  03/01/2011 FINDINGS: Mild brain atrophy and chronic white matter microvascular ischemic changes throughout the periventricular white matter. These changes have slightly progressed. No acute intracranial hemorrhage, mass lesion, definite infarction, midline shift, herniation, hydrocephalus, or extra-axial fluid collection. Cisterns patent. No cerebellar abnormality. Skull intact. Mastoids and sinuses remain clear. Atherosclerosis of the intracranial vessels. IMPRESSION: Atrophy and slight progression of chronic white matter microvascular ischemic changes. No acute intracranial process by noncontrast CT. Electronically Signed   By: Judie Petit.  Shick M.D.   On: 10/30/2015 18:15   Mr Brain  W Wo  Contrast  11/01/2015  CLINICAL DATA:  65 year old male with progressive lower extremity weakness for 6 months. Initial encounter. Diabetes EXAM: MRI HEAD WITHOUT AND WITH CONTRAST TECHNIQUE: Multiplanar, multiecho pulse sequences of the brain and surrounding structures were obtained without and with intravenous contrast. CONTRAST:  16mL MULTIHANCE GADOBENATE DIMEGLUMINE 529 MG/ML IV SOLN COMPARISON:  Head CT without contrast 10/30/2015. [facility] postoperative cervical spine radiograph 07/19/2011. FINDINGS: Major intracranial vascular flow voids are preserved. Late subacute to early chronic appearing right corona radiata 10 mm focus of diffusion signal change (series 100, image 36) with T2 shine through. No restricted diffusion or evidence of acute infarction. Additional patchy bilateral cerebral white matter T2 and FLAIR hyperintensity. Chronic lacunar infarct in the *SCRATCH* SPECT chronic lacunar infarcts in the right thalamus. Mild for age patchy T2 hyperintensity in the pons, but with associated pontine chronic micro hemorrhage (series 9, image 8). No cortical encephalomalacia. No midline shift, mass effect, evidence of mass lesion, ventriculomegaly, extra-axial collection or acute intracranial hemorrhage. Cervicomedullary junction and pituitary are within normal limits. Negative visualized cervical spine aside from hardware susceptibility artifact. No abnormal enhancement identified. Incidental anterior right frontal lobe developmental venous anomaly (normal anatomic variant). Visible internal auditory structures appear normal. Mastoids and paranasal sinuses are clear. Postoperative changes to the globes. Otherwise negative scalp and orbits soft tissues. Normal bone marrow signal. IMPRESSION: No acute intracranial abnormality. Moderate for age chronic small vessel disease with late subacute lacune in the right corona radiata. Electronically Signed   By: Odessa Fleming M.D.   On:  11/01/2015 10:19   Mr Foot Right Wo Contrast  11/01/2015  CLINICAL DATA:  Ulceration in a about 7 mm along the medial right great toe with purulence noted. Erythema extends dorsally in the foot. EXAM: MRI OF THE RIGHT FOREFOOT WITHOUT CONTRAST TECHNIQUE: Multiplanar, multisequence MR imaging was performed. No intravenous contrast was administered. COMPARISON:  10/30/2015 FINDINGS: There is subcutaneous edema diffusely in the foot especially dorsally, and extending into the ankle both medially and laterally. Low-level edema extends into the toes. On image 32 series 4 there is a defect in the skin and subcutaneous tissues medially along the great toe approximately at the level of the distal phalanx. The on images 23-25 of series 7 there seems to be abnormal increased inversion recovery weighted signal in the base of the distal phalanx suspicious for early osteomyelitis given the immediately adjacent ulceration. Sharply defined erosions or cystic lesions are present along the Lisfranc joint is, including adjacent to the expected attachment site of the Lisfranc ligament to the second metatarsal base. However, there is no overt malalignment at the Lisfranc joint. Erosions are present at the articulation of the navicular and medial cuneiform. There is thickening of the medial band of the plantar fascia proximally. IMPRESSION: 1. Ulceration medially along the great toe, with underlying edema signal in the marrow of the base of the distal phalanx suspicious for early osteomyelitis. No other osteomyelitis identified. 2. Cystic or erosive lesions along significant portions of the Lisfranc joint and at the articulation of the distal navicular with the medial cuneiform, suspicious for erosive arthropathy such as rheumatoid arthropathy. An alternative would be early Charcot joint. 3. Edema tracking throughout the foot and ankle, especially dorsally, potentially from cellulitis. Electronically Signed   By: Gaylyn Rong  M.D.   On: 11/01/2015 10:37   Dg Chest Port 1 View  11/05/2015  CLINICAL DATA:  PICC line placement. EXAM: PORTABLE CHEST 1 VIEW COMPARISON:  10/30/2015 FINDINGS: Right-sided PICC line has been placed, tip overlying the level of the lower superior vena cava. The heart is enlarged. There is mild elevation of the right hemidiaphragm which is stable. Right basilar atelectasis is present. There is no pneumothorax. Cervical hardware consistent with anterior fusion. IMPRESSION: Interval placement of right-sided PICC line. Cardiomegaly. Electronically Signed   By: Norva Pavlov M.D.   On: 11/05/2015 12:01   Dg Chest Port 1 View  10/30/2015  CLINICAL DATA:  Generalized weakness and elevated blood glucose today. EXAM: PORTABLE CHEST 1 VIEW COMPARISON:  CT chest 04/27/2015. PA and lateral chest 04/27/2015 and 01/22/2015. FINDINGS: Heart size is normal. The lungs are clear. No pneumothorax or pleural effusion. IMPRESSION: No acute disease. Electronically Signed   By: Drusilla Kanner M.D.   On: 10/30/2015 15:14   Dg Toe Great Right  10/30/2015  CLINICAL DATA:  Right toe swelling for 2 weeks.  Diabetic patient. EXAM: RIGHT GREAT TOE COMPARISON:  None. FINDINGS: There is no evidence of fracture or dislocation. There is no evidence of cortical erosions to suggest osteomyelitis radiographically. Heterogeneous appearance of the bony matrix of the first phalanx is noted, with uncertain significance. There is soft tissue swelling of the dorsal and palmar aspect of the mid and forefoot. IMPRESSION: No evidence of fracture or subluxation. No definitive radiographic evidence of osteomyelitis. Heterogeneous appearance of the bony matrix of the first digit with uncertain significance. Electronically Signed   By: Ted Mcalpine M.D.   On: 10/30/2015 13:51     CBC  Recent Labs Lab 10/30/15 1732 10/31/15 0540 11/01/15 0247 11/06/15 0420  WBC 12.0* 10.3 10.2 8.6  HGB 13.5 12.5* 12.1* 11.5*  HCT 40.3 36.8*  36.1* 34.5*  PLT 207 184 205 249  MCV 90.8 89.0 89.0 87.8  MCH 30.4 30.2 29.8 29.3  MCHC 33.5 34.0 33.5 33.4  RDW 14.2 14.5 14.5 14.5  LYMPHSABS 0.9* 1.4  --   --   MONOABS 1.1* 1.3*  --   --   EOSABS 0.0 0.0  --   --   BASOSABS 0.0 0.0  --   --     Chemistries   Recent Labs Lab 10/31/15 0540 11/01/15 0247 11/02/15 0549 11/06/15 0420 11/06/15 1224  NA 138 137  --  140  --   K 3.8 3.5  --  2.7* 3.8  CL 101 99*  --  101  --   CO2 28 26  --  31  --   GLUCOSE 215* 187*  --  153*  --   BUN 27* 41*  --  23*  --   CREATININE 1.49* 1.34* 1.21 0.79  --   CALCIUM 8.1* 8.0*  --  8.2*  --   MG  --   --   --   --  1.7   ------------------------------------------------------------------------------------------------------------------ estimated creatinine clearance is 91.4 mL/min (by C-G formula based on Cr of 0.79). ------------------------------------------------------------------------------------------------------------------ No results for input(s): HGBA1C in the last 72 hours. ------------------------------------------------------------------------------------------------------------------ No results for input(s): CHOL, HDL, LDLCALC, TRIG, CHOLHDL, LDLDIRECT in the last 72 hours. ------------------------------------------------------------------------------------------------------------------ No results for input(s): TSH, T4TOTAL, T3FREE, THYROIDAB in the last 72 hours.  Invalid input(s): FREET3 ------------------------------------------------------------------------------------------------------------------ No results for input(s): VITAMINB12, FOLATE, FERRITIN, TIBC, IRON, RETICCTPCT in the last 72 hours.  Coagulation profile  Recent Labs Lab 10/31/15 0540  INR 1.23    No results for input(s): DDIMER in the last 72 hours.  Cardiac Enzymes  Recent Labs Lab 10/30/15 2253 10/31/15 0540 10/31/15 1135  TROPONINI  0.66* 0.82* 0.71*    ------------------------------------------------------------------------------------------------------------------ Invalid input(s): POCBNP    Assessment & Plan   # PAD Angiogram on 11/04/2015.   Angioplasty done by vascular.  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 On IV unasyn now.  Podiatry consulted vascular for PAD ID consult appreciated. Vascular did Angiogram and I& D of right toe on 11/04/15 He may need long course of IV abx - as had osteomyelitis and bacteremia.  Wound culture sent from i& D.  as per podiatry- will need Amputation of toe and metatarsal tomorrow.  # Elevated troponin likely from CHF and Infection. No further workup advised by cardiology. ASA, Statin, BB.  # Acute hypoxic respiratory failure secondary to acute on chronic systolic chf - EF 20-25% Changed lasix to PO- stable respi status. Monitor daily weights and intake and output Continue aspirin, beta blocker and statin No acute findings.  # hypokalemia   Replace oral, check Mg.  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 Shaune Pollack has discussed with on-call ophthalmologist Dr. Duke Salvia, who has recommended outpatient follow-up after discharge  # Diabetes mellitus Sliding scale insulin blood sugars elevated. On glipizide. Added Levemir.   Held metformin due to need for angiogram.  blood sugar under control.     Code Status Orders        Start     Ordered   10/30/15 2249  Full code   Continuous     10/30/15 2248    Advance Directive Documentation        Most Recent Value   Type of Advance Directive  Healthcare Power of Attorney   Pre-existing out of facility DNR order (yellow form or pink MOST form)     "MOST" Form in Place?        Consults  Cardiology  DVT Prophylaxis  heparin  Lab Results  Component Value Date   PLT 249 11/06/2015     Time Spent in minutes   35  minutes  Altamese Dilling M.D on 11/06/2015 at 3:35 PM  Between 7am to 6pm - Pager - [phone]  After 6pm go to [email or web address] - password [email or web address] [facility]  [facility]   Office  [phone]

## 2015-11-06 NOTE — Progress Notes (Signed)
Informed Dr. Elpidio AnisSudini about Patient's potassium of 2.7. New order: Potassium 10meq IV D1X2. And PO potassium 40meq every 4hrs. Will continue to monitor.

## 2015-11-06 NOTE — Progress Notes (Signed)
Received report from [place]. And resumed care for patient 4:39 PM. Patient is sitting in chair watching tv, no complaints of pain or SOB. Pt stated that he is missing his wallet that had personal information and money. Last time patient had wallet was last night. No wallet was found in room. Will continue to monitor patient. Daniel [name]

## 2015-11-06 NOTE — Progress Notes (Signed)
[facility] - [place] at [facility]                                                                                                                                                                                            Patient Demographics   Daniel [name], is a 65 y.o. male, DOB - Nov 19, 1950, WUJ:[id number]  Admit date - 10/30/2015   Admitting Physician Ramonita Lab, MD  Outpatient Primary MD for the patient is [name],[name], MD   LOS - 7  Subjective: Patient admitted with shortness of breath,  right toe erythema and pain, also left sided new eye visual difficulties. Continues to have pain right foot. SOB improved. No CP. Afebrile. Off Oxygen  Angio and I& D of right toe is done 11/04/15, less pain.  Review of Systems:   CONSTITUTIONAL: No documented fever. No fatigue, weakness. No weight gain, no weight loss.  EYES: No blurry or double vision. Left eye visual difficulties ENT: No tinnitus. No postnasal drip. No redness of the oropharynx.  RESPIRATORY: No cough, no wheeze, no hemoptysis. Positive dyspnea.  CARDIOVASCULAR: No chest pain. No orthopnea. No palpitations. No syncope.  GASTROINTESTINAL: No nausea, no vomiting or diarrhea. No abdominal pain. No melena or hematochezia.  GENITOURINARY: No dysuria or hematuria.  ENDOCRINE: No polyuria or nocturia. No heat or cold intolerance.  HEMATOLOGY: No anemia. No bruising. No bleeding.  INTEGUMENTARY: No rashes. No lesions.  MUSCULOSKELETAL: No arthritis. No swelling. No gout. Right foot pain. NEUROLOGIC: No numbness, tingling, or ataxia. No seizure-type activity. Generalized weakness PSYCHIATRIC: No anxiety. No insomnia. No ADD.     Vitals:   Filed Vitals:   11/05/15 2006 11/06/15 0557 11/06/15 0831 11/06/15 1102  BP: 177/100 146/83 165/74 157/89  Pulse: 96 72 81 76  Temp: 98.2 F (36.8 C) 98.5 F (36.9 C) 98.7 F (37.1 C) 98 F (36.7 C)  TempSrc: Oral Oral Oral Oral  Resp: 18 18 17 18    Height:      Weight:  78.427 kg (172 lb 14.4 oz)    SpO2: 99% 91% 98% 95%    Wt Readings from Last 3 Encounters:  11/06/15 78.427 kg (172 lb 14.4 oz)  04/27/15 74.844 kg (165 lb)     Intake/Output Summary (Last 24 hours) at 11/06/15 1530 Last data filed at 11/06/15 1300  Gross per 24 hour  Intake    340 ml  Output   1250 ml  Net   -910 ml    Physical Exam:   GENERAL: Pleasant-appearing in no apparent distress.  HEAD, EYES, EARS, NOSE AND THROAT: Atraumatic, normocephalic. Extraocular muscles are intact. Pupils  equal and reactive to light. Sclerae anicteric. No conjunctival injection. No oro-pharyngeal erythema.  NECK: Supple. There is no jugular venous distention. No bruits, no lymphadenopathy, no thyromegaly.  HEART: Regular rate and rhythm,. No murmurs, no rubs, no clicks.  LUNGS: Bilateral crackles at the bases ABDOMEN: Soft, flat, nontender, nondistended. Has good bowel sounds. No hepatosplenomegaly appreciated.  EXTREMITIES: right foot redenss, with cynosis on great toe, with tenderness and swelling on whole foot. NEUROLOGIC: The patient is alert, awake, and oriented x3 with no focal motor deficits appreciated bilaterally. Decreased sensations feet. SKIN: Right great toe erythematous surrounding cellulitis- dressing present on right foot. Psych: Not anxious, depressed LN: No inguinal LN enlargement Right foot redness with gangrene great toe.   Antibiotics   Anti-infectives    Start     Dose/Rate Route Frequency Ordered Stop   11/02/15 1800  Ampicillin-Sulbactam (UNASYN) 3 g in sodium chloride 0.9 % 100 mL IVPB     3 g 100 mL/hr over 60 Minutes Intravenous Every 6 hours 11/02/15 1522     11/02/15 1400  piperacillin-tazobactam (ZOSYN) IVPB 3.375 g  Status:  Discontinued     3.375 g 12.5 mL/hr over 240 Minutes Intravenous 3 times per day 11/02/15 1350 11/02/15 1444   10/31/15 1130  Ampicillin-Sulbactam (UNASYN) 3 g in sodium chloride 0.9 % 100 mL IVPB  Status:   Discontinued     3 g 100 mL/hr over 60 Minutes Intravenous Every 6 hours 10/31/15 1035 11/02/15 1334   10/31/15 0500  vancomycin (VANCOCIN) IVPB 1000 mg/200 mL premix  Status:  Discontinued     1,000 mg 200 mL/hr over 60 Minutes Intravenous Every 12 hours 10/30/15 2248 11/02/15 1333   10/30/15 2130  vancomycin (VANCOCIN) IVPB 1000 mg/200 mL premix  Status:  Discontinued     1,000 mg 200 mL/hr over 60 Minutes Intravenous  Once 10/30/15 2120 10/30/15 2141   10/30/15 1645  piperacillin-tazobactam (ZOSYN) IVPB 3.375 g     3.375 g 100 mL/hr over 30 Minutes Intravenous  Once 10/30/15 1633 10/30/15 2019   10/30/15 1645  vancomycin (VANCOCIN) IVPB 1000 mg/200 mL premix     1,000 mg 200 mL/hr over 60 Minutes Intravenous  Once 10/30/15 1633 10/30/15 2019      Medications   Scheduled Meds: . ampicillin-sulbactam (UNASYN) IV  3 g Intravenous Q6H  . aspirin EC  81 mg Oral Daily  . atorvastatin  40 mg Oral QHS  . enoxaparin (LOVENOX) injection  40 mg Subcutaneous Q24H  . furosemide  40 mg Oral BID  . glipiZIDE  10 mg Oral BID AC  . insulin aspart  0-9 Units Subcutaneous TID AC & HS  . insulin detemir  5 Units Subcutaneous QHS  . losartan  100 mg Oral Daily  . metoprolol tartrate  25 mg Oral BID  . pantoprazole  40 mg Oral QAC breakfast  . potassium chloride  40 mEq Oral BID  . pregabalin  75 mg Oral BID   Continuous Infusions:   PRN Meds:.acetaminophen, nitroGLYCERIN, ondansetron (ZOFRAN) IV, oxyCODONE   Data Review:   Micro Results Recent Results (from the past 240 hour(s))  Culture, blood (routine x 2)     Status: None   Collection Time: 10/30/15  4:44 PM  Result Value Ref Range Status   Specimen Description BLOOD LEFT WRIST  Final   Special Requests BOTTLES DRAWN AEROBIC AND ANAEROBIC  2CC  Final   Culture  Setup Time   Final    GRAM POSITIVE COCCI IN CLUSTERS  AEROBIC BOTTLE ONLY CRITICAL RESULT CALLED TO, READ BACK BY AND VERIFIED WITH: JASON ROBBINS AT 1710 ON 10/31/15  CTJ    Culture STAPHYLOCOCCUS AUREUS AEROBIC BOTTLE ONLY   Final   Report Status 11/04/2015 FINAL  Final   Organism ID, Bacteria STAPHYLOCOCCUS AUREUS  Final      Susceptibility   Staphylococcus aureus - MIC*    CIPROFLOXACIN <=0.5 SENSITIVE Sensitive     ERYTHROMYCIN 0.5 SENSITIVE Sensitive     GENTAMICIN <=0.5 SENSITIVE Sensitive     OXACILLIN <=0.25 SENSITIVE Sensitive     TRIMETH/SULFA <=10 SENSITIVE Sensitive     CLINDAMYCIN <=0.25 SENSITIVE Sensitive     CEFOXITIN SCREEN NEGATIVE Sensitive     Inducible Clindamycin NEGATIVE Sensitive     TETRACYCLINE Value in next row Sensitive      SENSITIVE<=1    * STAPHYLOCOCCUS AUREUS  Blood Culture ID Panel (Reflexed)     Status: Abnormal   Collection Time: 10/30/15  4:44 PM  Result Value Ref Range Status   Enterococcus species NOT DETECTED NOT DETECTED Final   Listeria monocytogenes NOT DETECTED NOT DETECTED Final   Staphylococcus species DETECTED (A) NOT DETECTED Corrected    Comment: CORRECTED ON 12/27 AT 0801: PREVIOUSLY REPORTED AS NOT DETECTED   Staphylococcus aureus DETECTED (A) NOT DETECTED Final    Comment: CRITICAL RESULT CALLED TO, READ BACK BY AND VERIFIED WITH: JASON ROBBINS AT 1710 ON 10/31/15 CTJ    Streptococcus species NOT DETECTED NOT DETECTED Final   Streptococcus agalactiae NOT DETECTED NOT DETECTED Final   Streptococcus pneumoniae NOT DETECTED NOT DETECTED Final   Streptococcus pyogenes NOT DETECTED NOT DETECTED Final   Acinetobacter baumannii NOT DETECTED NOT DETECTED Final   Enterobacteriaceae species NOT DETECTED NOT DETECTED Final   Enterobacter cloacae complex NOT DETECTED NOT DETECTED Final   Escherichia coli NOT DETECTED NOT DETECTED Final   Klebsiella oxytoca NOT DETECTED NOT DETECTED Final   Klebsiella pneumoniae NOT DETECTED NOT DETECTED Final   Proteus species NOT DETECTED NOT DETECTED Final   Serratia marcescens NOT DETECTED NOT DETECTED Final   Haemophilus influenzae NOT DETECTED NOT DETECTED  Final   Neisseria meningitidis NOT DETECTED NOT DETECTED Final   Pseudomonas aeruginosa NOT DETECTED NOT DETECTED Final   Candida albicans NOT DETECTED NOT DETECTED Final   Candida glabrata NOT DETECTED NOT DETECTED Final   Candida krusei NOT DETECTED NOT DETECTED Final   Candida parapsilosis NOT DETECTED NOT DETECTED Final   Candida tropicalis NOT DETECTED NOT DETECTED Final   Carbapenem resistance NOT DETECTED NOT DETECTED Final   Methicillin resistance NOT DETECTED NOT DETECTED Final   Vancomycin resistance NOT DETECTED NOT DETECTED Final  Culture, blood (routine x 2)     Status: None   Collection Time: 10/30/15  5:31 PM  Result Value Ref Range Status   Specimen Description BLOOD LEFT HAND  Final   Special Requests BOTTLES DRAWN AEROBIC AND ANAEROBIC  2CC  Final   Culture  Setup Time   Final    GRAM POSITIVE COCCI IN CHAINS IN BOTH AEROBIC AND ANAEROBIC BOTTLES CRITICAL RESULT CALLED TO, READ BACK BY AND VERIFIED WITH: KAREN HAYES AT 1045 10/31/15 CTJ    Culture   Final    VIRIDANS STREPTOCOCCUS IN BOTH AEROBIC AND ANAEROBIC BOTTLES MULTIPLE SPECIES PRESENT Results consistent with contamination.    Report Status 11/02/2015 FINAL  Final  Blood Culture ID Panel (Reflexed)     Status: Abnormal   Collection Time: 10/30/15  5:31 PM  Result Value Ref Range  Status   Enterococcus species NOT DETECTED NOT DETECTED Final   Listeria monocytogenes NOT DETECTED NOT DETECTED Final   Staphylococcus species NOT DETECTED NOT DETECTED Final   Staphylococcus aureus NOT DETECTED NOT DETECTED Final   Streptococcus species DETECTED (A) NOT DETECTED Final    Comment: CRITICAL RESULT CALLED TO, READ BACK BY AND VERIFIED WITH: KAREN HAYES AT 1045 ON 10/31/15 CTJ    Streptococcus agalactiae NOT DETECTED NOT DETECTED Final   Streptococcus pneumoniae NOT DETECTED NOT DETECTED Final   Streptococcus pyogenes NOT DETECTED NOT DETECTED Final   Acinetobacter baumannii NOT DETECTED NOT DETECTED Final    Enterobacteriaceae species NOT DETECTED NOT DETECTED Final   Enterobacter cloacae complex NOT DETECTED NOT DETECTED Final   Escherichia coli NOT DETECTED NOT DETECTED Final   Klebsiella oxytoca NOT DETECTED NOT DETECTED Final   Klebsiella pneumoniae NOT DETECTED NOT DETECTED Final   Proteus species NOT DETECTED NOT DETECTED Final   Serratia marcescens NOT DETECTED NOT DETECTED Final   Haemophilus influenzae NOT DETECTED NOT DETECTED Final   Neisseria meningitidis NOT DETECTED NOT DETECTED Final   Pseudomonas aeruginosa NOT DETECTED NOT DETECTED Final   Candida albicans NOT DETECTED NOT DETECTED Final   Candida glabrata NOT DETECTED NOT DETECTED Final   Candida krusei NOT DETECTED NOT DETECTED Final   Candida parapsilosis NOT DETECTED NOT DETECTED Final   Candida tropicalis NOT DETECTED NOT DETECTED Final   Carbapenem resistance NOT DETECTED NOT DETECTED Final   Methicillin resistance NOT DETECTED NOT DETECTED Final   Vancomycin resistance NOT DETECTED NOT DETECTED Final  CULTURE, BLOOD (ROUTINE X 2) w Reflex to PCR ID Panel     Status: None   Collection Time: 10/31/15 11:35 AM  Result Value Ref Range Status   Specimen Description BLOOD RIGHT HAND  Final   Special Requests BOTTLES DRAWN AEROBIC AND ANAEROBIC 2CC  Final   Culture NO GROWTH 5 DAYS  Final   Report Status 11/05/2015 FINAL  Final  CULTURE, BLOOD (ROUTINE X 2) w Reflex to PCR ID Panel     Status: None   Collection Time: 10/31/15 11:35 AM  Result Value Ref Range Status   Specimen Description BLOOD RIGHT ASSIST CONTROL  Final   Special Requests BOTTLES DRAWN AEROBIC AND ANAEROBIC 2CC  Final   Culture NO GROWTH 5 DAYS  Final   Report Status 11/05/2015 FINAL  Final  Wound culture     Status: None   Collection Time: 10/31/15  2:12 PM  Result Value Ref Range Status   Specimen Description ULCER  Final   Special Requests NONE  Final   Gram Stain   Final    RARE WBC SEEN MODERATE GRAM POSITIVE COCCI IN PAIRS IN CLUSTERS RARE  GRAM NEGATIVE RODS RARE GRAM POSITIVE RODS    Culture   Final    MODERATE GROWTH PROVIDENCIA RETTGERI HEAVY GROWTH STAPHYLOCOCCUS AUREUS    Report Status 11/03/2015 FINAL  Final   Organism ID, Bacteria PROVIDENCIA RETTGERI  Final   Organism ID, Bacteria STAPHYLOCOCCUS AUREUS  Final      Susceptibility   Staphylococcus aureus - MIC*    CIPROFLOXACIN <=0.5 SENSITIVE Sensitive     ERYTHROMYCIN <=0.25 SENSITIVE Sensitive     GENTAMICIN <=0.5 SENSITIVE Sensitive     OXACILLIN <=0.25 SENSITIVE Sensitive     TRIMETH/SULFA <=10 SENSITIVE Sensitive     CLINDAMYCIN <=0.25 SENSITIVE Sensitive     CEFOXITIN SCREEN NEGATIVE Sensitive     Inducible Clindamycin NEGATIVE Sensitive     TETRACYCLINE Value  in next row Sensitive      SENSITIVE<=1    * HEAVY GROWTH STAPHYLOCOCCUS AUREUS   Providencia rettgeri - MIC*    AMPICILLIN Value in next row Resistant      SENSITIVE<=1    CEFTAZIDIME Value in next row Sensitive      SENSITIVE<=1    CEFAZOLIN Value in next row Resistant      SENSITIVE<=1    CEFTRIAXONE Value in next row Sensitive      SENSITIVE<=1    CIPROFLOXACIN Value in next row Sensitive      SENSITIVE<=1    GENTAMICIN Value in next row Sensitive      SENSITIVE<=1    IMIPENEM Value in next row Sensitive      SENSITIVE<=1    TRIMETH/SULFA Value in next row Resistant      SENSITIVE<=1    PIP/TAZO Value in next row Sensitive      SENSITIVE<=4    AMPICILLIN/SULBACTAM Value in next row Sensitive      SENSITIVE<=2    * MODERATE GROWTH PROVIDENCIA RETTGERI  Culture, blood (Routine X 2) w Reflex to ID Panel     Status: None   Collection Time: 11/01/15  5:07 PM  Result Value Ref Range Status   Specimen Description BLOOD RIGHT ASSIST CONTROL  Final   Special Requests BOTTLES DRAWN AEROBIC AND ANAEROBIC 5CC  Final   Culture NO GROWTH 5 DAYS  Final   Report Status 11/06/2015 FINAL  Final  Culture, blood (Routine X 2) w Reflex to ID Panel     Status: None   Collection Time: 11/01/15  5:07  PM  Result Value Ref Range Status   Specimen Description BLOOD RIGHT HAND  Final   Special Requests BOTTLES DRAWN AEROBIC AND ANAEROBIC 3CC  Final   Culture NO GROWTH 5 DAYS  Final   Report Status 11/06/2015 FINAL  Final  Anaerobic culture     Status: None (Preliminary result)   Collection Time: 11/04/15  7:20 PM  Result Value Ref Range Status   Specimen Description TOE RIGHT GREAT TOE  Final   Special Requests NONE  Final   Culture   Final    NO ANAEROBES ISOLATED; CULTURE IN PROGRESS FOR 5 DAYS   Report Status PENDING  Incomplete  Wound culture     Status: None (Preliminary result)   Collection Time: 11/04/15  7:20 PM  Result Value Ref Range Status   Specimen Description WOUND RIGHT GREAT TOE  Final   Special Requests NONE  Final   Gram Stain RARE WBC SEEN NO ORGANISMS SEEN   Final   Culture NO GROWTH 2 DAYS  Final   Report Status PENDING  Incomplete    Radiology Reports Ct Head Wo Contrast  10/30/2015  CLINICAL DATA:  Generalized weakness, diabetes, hypertension, right visual disturbance EXAM: CT HEAD WITHOUT CONTRAST TECHNIQUE: Contiguous axial images were obtained from the base of the skull through the vertex without contrast. COMPARISON:  03/01/2011 FINDINGS: Mild brain atrophy and chronic white matter microvascular ischemic changes throughout the periventricular white matter. These changes have slightly progressed. No acute intracranial hemorrhage, mass lesion, definite infarction, midline shift, herniation, hydrocephalus, or extra-axial fluid collection. Cisterns patent. No cerebellar abnormality. Skull intact. Mastoids and sinuses remain clear. Atherosclerosis of the intracranial vessels. IMPRESSION: Atrophy and slight progression of chronic white matter microvascular ischemic changes. No acute intracranial process by noncontrast CT. Electronically Signed   By: Judie Petit.  Shick M.D.   On: 10/30/2015 18:15   Mr Laqueta Jean ZO  Contrast  11/01/2015  CLINICAL DATA:  65 year old male with  progressive lower extremity weakness for 6 months. Initial encounter. Diabetes EXAM: MRI HEAD WITHOUT AND WITH CONTRAST TECHNIQUE: Multiplanar, multiecho pulse sequences of the brain and surrounding structures were obtained without and with intravenous contrast. CONTRAST:  16mL MULTIHANCE GADOBENATE DIMEGLUMINE 529 MG/ML IV SOLN COMPARISON:  Head CT without contrast 10/30/2015. [facility] postoperative cervical spine radiograph 07/19/2011. FINDINGS: Major intracranial vascular flow voids are preserved. Late subacute to early chronic appearing right corona radiata 10 mm focus of diffusion signal change (series 100, image 36) with T2 shine through. No restricted diffusion or evidence of acute infarction. Additional patchy bilateral cerebral white matter T2 and FLAIR hyperintensity. Chronic lacunar infarct in the *SCRATCH* SPECT chronic lacunar infarcts in the right thalamus. Mild for age patchy T2 hyperintensity in the pons, but with associated pontine chronic micro hemorrhage (series 9, image 8). No cortical encephalomalacia. No midline shift, mass effect, evidence of mass lesion, ventriculomegaly, extra-axial collection or acute intracranial hemorrhage. Cervicomedullary junction and pituitary are within normal limits. Negative visualized cervical spine aside from hardware susceptibility artifact. No abnormal enhancement identified. Incidental anterior right frontal lobe developmental venous anomaly (normal anatomic variant). Visible internal auditory structures appear normal. Mastoids and paranasal sinuses are clear. Postoperative changes to the globes. Otherwise negative scalp and orbits soft tissues. Normal bone marrow signal. IMPRESSION: No acute intracranial abnormality. Moderate for age chronic small vessel disease with late subacute lacune in the right corona radiata. Electronically Signed   By: Odessa Fleming M.D.   On: 11/01/2015 10:19   Mr Foot Right Wo Contrast  11/01/2015  CLINICAL  DATA:  Ulceration in a about 7 mm along the medial right great toe with purulence noted. Erythema extends dorsally in the foot. EXAM: MRI OF THE RIGHT FOREFOOT WITHOUT CONTRAST TECHNIQUE: Multiplanar, multisequence MR imaging was performed. No intravenous contrast was administered. COMPARISON:  10/30/2015 FINDINGS: There is subcutaneous edema diffusely in the foot especially dorsally, and extending into the ankle both medially and laterally. Low-level edema extends into the toes. On image 32 series 4 there is a defect in the skin and subcutaneous tissues medially along the great toe approximately at the level of the distal phalanx. The on images 23-25 of series 7 there seems to be abnormal increased inversion recovery weighted signal in the base of the distal phalanx suspicious for early osteomyelitis given the immediately adjacent ulceration. Sharply defined erosions or cystic lesions are present along the Lisfranc joint is, including adjacent to the expected attachment site of the Lisfranc ligament to the second metatarsal base. However, there is no overt malalignment at the Lisfranc joint. Erosions are present at the articulation of the navicular and medial cuneiform. There is thickening of the medial band of the plantar fascia proximally. IMPRESSION: 1. Ulceration medially along the great toe, with underlying edema signal in the marrow of the base of the distal phalanx suspicious for early osteomyelitis. No other osteomyelitis identified. 2. Cystic or erosive lesions along significant portions of the Lisfranc joint and at the articulation of the distal navicular with the medial cuneiform, suspicious for erosive arthropathy such as rheumatoid arthropathy. An alternative would be early Charcot joint. 3. Edema tracking throughout the foot and ankle, especially dorsally, potentially from cellulitis. Electronically Signed   By: Gaylyn Rong M.D.   On: 11/01/2015 10:37   Dg Chest Port 1 View  11/05/2015   CLINICAL DATA:  PICC line placement. EXAM: PORTABLE CHEST 1 VIEW COMPARISON:  10/30/2015  FINDINGS: Right-sided PICC line has been placed, tip overlying the level of the lower superior vena cava. The heart is enlarged. There is mild elevation of the right hemidiaphragm which is stable. Right basilar atelectasis is present. There is no pneumothorax. Cervical hardware consistent with anterior fusion. IMPRESSION: Interval placement of right-sided PICC line. Cardiomegaly. Electronically Signed   By: Norva Pavlov M.D.   On: 11/05/2015 12:01   Dg Chest Port 1 View  10/30/2015  CLINICAL DATA:  Generalized weakness and elevated blood glucose today. EXAM: PORTABLE CHEST 1 VIEW COMPARISON:  CT chest 04/27/2015. PA and lateral chest 04/27/2015 and 01/22/2015. FINDINGS: Heart size is normal. The lungs are clear. No pneumothorax or pleural effusion. IMPRESSION: No acute disease. Electronically Signed   By: Drusilla Kanner M.D.   On: 10/30/2015 15:14   Dg Toe Great Right  10/30/2015  CLINICAL DATA:  Right toe swelling for 2 weeks.  Diabetic patient. EXAM: RIGHT GREAT TOE COMPARISON:  None. FINDINGS: There is no evidence of fracture or dislocation. There is no evidence of cortical erosions to suggest osteomyelitis radiographically. Heterogeneous appearance of the bony matrix of the first phalanx is noted, with uncertain significance. There is soft tissue swelling of the dorsal and palmar aspect of the mid and forefoot. IMPRESSION: No evidence of fracture or subluxation. No definitive radiographic evidence of osteomyelitis. Heterogeneous appearance of the bony matrix of the first digit with uncertain significance. Electronically Signed   By: Ted Mcalpine M.D.   On: 10/30/2015 13:51     CBC  Recent Labs Lab 10/30/15 1732 10/31/15 0540 11/01/15 0247 11/06/15 0420  WBC 12.0* 10.3 10.2 8.6  HGB 13.5 12.5* 12.1* 11.5*  HCT 40.3 36.8* 36.1* 34.5*  PLT 207 184 205 249  MCV 90.8 89.0 89.0 87.8  MCH 30.4  30.2 29.8 29.3  MCHC 33.5 34.0 33.5 33.4  RDW 14.2 14.5 14.5 14.5  LYMPHSABS 0.9* 1.4  --   --   MONOABS 1.1* 1.3*  --   --   EOSABS 0.0 0.0  --   --   BASOSABS 0.0 0.0  --   --     Chemistries   Recent Labs Lab 10/31/15 0540 11/01/15 0247 11/02/15 0549 11/06/15 0420 11/06/15 1224  NA 138 137  --  140  --   K 3.8 3.5  --  2.7* 3.8  CL 101 99*  --  101  --   CO2 28 26  --  31  --   GLUCOSE 215* 187*  --  153*  --   BUN 27* 41*  --  23*  --   CREATININE 1.49* 1.34* 1.21 0.79  --   CALCIUM 8.1* 8.0*  --  8.2*  --   MG  --   --   --   --  1.7   ------------------------------------------------------------------------------------------------------------------ estimated creatinine clearance is 91.4 mL/min (by C-G formula based on Cr of 0.79). ------------------------------------------------------------------------------------------------------------------ No results for input(s): HGBA1C in the last 72 hours. ------------------------------------------------------------------------------------------------------------------ No results for input(s): CHOL, HDL, LDLCALC, TRIG, CHOLHDL, LDLDIRECT in the last 72 hours. ------------------------------------------------------------------------------------------------------------------ No results for input(s): TSH, T4TOTAL, T3FREE, THYROIDAB in the last 72 hours.  Invalid input(s): FREET3 ------------------------------------------------------------------------------------------------------------------ No results for input(s): VITAMINB12, FOLATE, FERRITIN, TIBC, IRON, RETICCTPCT in the last 72 hours.  Coagulation profile  Recent Labs Lab 10/31/15 0540  INR 1.23    No results for input(s): DDIMER in the last 72 hours.  Cardiac Enzymes  Recent Labs Lab 10/30/15 2253 10/31/15 0540 10/31/15 1135  TROPONINI 0.66*  0.82* 0.71*    ------------------------------------------------------------------------------------------------------------------ Invalid input(s): POCBNP    Assessment & Plan   # PAD Angiogram on 11/04/2015.   Angioplasty done by vascular.  # Elevated troponin likely from CHF and Infection. No further workup advised by cardiology. ASA, Statin, BB.  # Acute hypoxic respiratory failure secondary to acute on chronic systolic chf - EF 20-25% Changed lasix to PO- stable respi status. Monitor daily weights and intake and output Continue aspirin, beta blocker and statin No acute findings.  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 On IV unasyn now.  Podiatry consulted vascular for PAD ID consult appreciated. Vascular did Angiogram and I& D of right toe on 11/04/15 He may need long course of IV abx - as had osteomyelitis and bacteremia.  Wound culture sent from i& D.  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 Shaune Pollack has discussed with on-call ophthalmologist Dr. Duke Salvia, who has recommended outpatient follow-up after discharge  # Diabetes mellitus Sliding scale insulin blood sugars elevated. On glipizide. Added Levemir.   Held metformin due to need for angiogram.  blood sugar under control.     Code Status Orders        Start     Ordered   10/30/15 2249  Full code   Continuous     10/30/15 2248    Advance Directive Documentation        Most Recent Value   Type of Advance Directive  Healthcare Power of Attorney   Pre-existing out of facility DNR order (yellow form or pink MOST form)     "MOST" Form in Place?        Consults  Cardiology  DVT Prophylaxis  heparin  Lab Results  Component Value Date   PLT 249 11/06/2015     Time Spent in minutes   35 minutes  Altamese Dilling M.D on 11/06/2015 at 3:30 PM  Between 7am to 6pm - Pager - [phone]  After  6pm go to [email or web address] - password [email or web address] [facility]  [facility]   Office  [phone]

## 2015-11-06 NOTE — Progress Notes (Signed)
2 Days Post-Op  Subjective: Patient seen. Still having some significant pain in his right foot and great toe area.  Objective: Vital signs in last 24 hours: Temp:  [98 F (36.7 C)-98.7 F (37.1 C)] 98 F (36.7 C) (01/01 1102) Pulse Rate:  [72-96] 76 (01/01 1102) Resp:  [17-18] 18 (01/01 1102) BP: (146-177)/(74-100) 157/89 mmHg (01/01 1102) SpO2:  [91 %-99 %] 95 % (01/01 1102) Weight:  [78.427 kg (172 lb 14.4 oz)] 78.427 kg (172 lb 14.4 oz) (01/01 0557) Last BM Date: 10/30/15  Intake/Output from previous day: 12/31 0701 - 01/01 0700 In: 1171 [P.O.:1071; IV Piggyback:100] Out: 1100 [Urine:1100] Intake/Output this shift: Total I/O In: 100 [IV Piggyback:100] Out: 400 [Urine:400]  Upon removal of the bandage there is progressive cyanosis and gangrenous changes to the entire right hallux. Full-thickness ulcerative area along the medial aspect which appears to probe down to the bone level. Incision along the medial first metatarsal from vascular surgery I&D is well coapted and stable with no significant drainage some mottled ecchymotic areas are noted in the distal forefoot and midfoot region mostly around the medial aspect of the foot. Significant pain on any physical palpation or motion  Lab Results:   Recent Labs  11/06/15 0420  WBC 8.6  HGB 11.5*  HCT 34.5*  PLT 249   BMET  Recent Labs  11/06/15 0420  NA 140  K 2.7*  CL 101  CO2 31  GLUCOSE 153*  BUN 23*  CREATININE 0.79  CALCIUM 8.2*   PT/INR No results for input(s): LABPROT, INR in the last 72 hours. ABG No results for input(s): PHART, HCO3 in the last 72 hours.  Invalid input(s): PCO2, PO2  Studies/Results: Dg Chest Port 1 View  11/05/2015  CLINICAL DATA:  PICC line placement. EXAM: PORTABLE CHEST 1 VIEW COMPARISON:  10/30/2015 FINDINGS: Right-sided PICC line has been placed, tip overlying the level of the lower superior vena cava. The heart is enlarged. There is mild elevation of the right hemidiaphragm  which is stable. Right basilar atelectasis is present. There is no pneumothorax. Cervical hardware consistent with anterior fusion. IMPRESSION: Interval placement of right-sided PICC line. Cardiomegaly. Electronically Signed   By: Elizabeth  Brown M.D.   On: 11/05/2015 12:01    Anti-infectives: Anti-infectives    Start     Dose/Rate Route Frequency Ordered Stop   11/02/15 1800  Ampicillin-Sulbactam (UNASYN) 3 g in sodium chloride 0.9 % 100 mL IVPB     3 g 100 mL/hr over 60 Minutes Intravenous Every 6 hours 11/02/15 1522     11/02/15 1400  piperacillin-tazobactam (ZOSYN) IVPB 3.375 g  Status:  Discontinued     3.375 g 12.5 mL/hr over 240 Minutes Intravenous 3 times per day 11/02/15 1350 11/02/15 1444   10/31/15 1130  Ampicillin-Sulbactam (UNASYN) 3 g in sodium chloride 0.9 % 100 mL IVPB  Status:  Discontinued     3 g 100 mL/hr over 60 Minutes Intravenous Every 6 hours 10/31/15 1035 11/02/15 1334   10/31/15 0500  vancomycin (VANCOCIN) IVPB 1000 mg/200 mL premix  Status:  Discontinued     1,000 mg 200 mL/hr over 60 Minutes Intravenous Every 12 hours 10/30/15 2248 11/02/15 1333   10/30/15 2130  vancomycin (VANCOCIN) IVPB 1000 mg/200 mL premix  Status:  Discontinued     1,000 mg 200 mL/hr over 60 Minutes Intravenous  Once 10/30/15 2120 10/30/15 2141   10/30/15 1645  piperacillin-tazobactam (ZOSYN) IVPB 3.375 g     3.375 g 100 mL/hr over 30   Minutes Intravenous  Once 10/30/15 1633 10/30/15 2019   10/30/15 1645  vancomycin (VANCOCIN) IVPB 1000 mg/200 mL premix     1,000 mg 200 mL/hr over 60 Minutes Intravenous  Once 10/30/15 1633 10/30/15 2019      Assessment/Plan: s/p Procedure(s): DEBRIDEMENT WOUND (Right) Assessment: Gangrenous changes right hallux status post vascular intervention   Plan: Discussed with the patient the need for amputation of his right great toe at this point. We also discussed the possibility of having to remove some of the first metatarsal to be able to get the wound  closed due to the amount of devitalized tissue. Discussed with the patient risks and complications including the main risk of nonhealing due to his vascular status or diabetes. Also discussed the possibility of further amputation if it does not heal. We will obtain a consent for amputation of the right great toe with possible partial first ray resection. Nothing by mouth after midnight. We will plan on surgery tomorrow morning.  LOS: 7 days    Ritu Gagliardo W. 11/06/2015  

## 2015-11-07 ENCOUNTER — Inpatient Hospital Stay: Payer: Commercial Managed Care - HMO | Admitting: Anesthesiology

## 2015-11-07 ENCOUNTER — Encounter
Admission: EM | Disposition: A | Discharge status: Skilled Nursing Facility | Payer: Self-pay | Source: Home / Self Care | Attending: Internal Medicine

## 2015-11-07 HISTORY — PX: AMPUTATION TOE: SHX6595

## 2015-11-07 LAB — BASIC METABOLIC PANEL
Anion gap: 7 (ref 5–15)
BUN: 18 mg/dL (ref 6–20)
CALCIUM: 8.2 mg/dL — AB (ref 8.9–10.3)
CO2: 30 mmol/L (ref 22–32)
CREATININE: 0.75 mg/dL (ref 0.61–1.24)
Chloride: 103 mmol/L (ref 101–111)
GFR calc Af Amer: >60 mL/min (ref >60)
GLUCOSE: 141 mg/dL — AB (ref 65–99)
Potassium: 3.7 mmol/L (ref 3.5–5.1)
Sodium: 140 mmol/L (ref 135–145)

## 2015-11-07 LAB — WOUND CULTURE: CULTURE: NO GROWTH

## 2015-11-07 LAB — GLUCOSE, CAPILLARY
GLUCOSE-CAPILLARY: 178 mg/dL — AB (ref 65–99)
Glucose-Capillary: 101 mg/dL — ABNORMAL HIGH (ref 65–99)
Glucose-Capillary: 202 mg/dL — ABNORMAL HIGH (ref 65–99)
Glucose-Capillary: 181 mg/dL — ABNORMAL HIGH (ref 65–99)

## 2015-11-07 SURGERY — AMPUTATION, TOE
Anesthesia: Monitor Anesthesia Care | Site: Foot | Laterality: Right

## 2015-11-07 MED ORDER — LABETALOL HCL 5 MG/ML IV SOLN
5.0000 mg | Freq: Once | INTRAVENOUS | Status: AC
  Administered 2015-11-07: 5 mg via INTRAVENOUS

## 2015-11-07 MED ORDER — MORPHINE SULFATE (PF) 2 MG/ML IV SOLN
2.0000 mg | INTRAVENOUS | Status: DC | PRN
  Administered 2015-11-07 (×2): 2 mg via INTRAVENOUS
  Filled 2015-11-07 (×2): qty 1

## 2015-11-07 MED ORDER — SODIUM CHLORIDE 0.9 % IV SOLN: 250.0000 mL | INTRAVENOUS | Status: DC | PRN

## 2015-11-07 MED ORDER — INSULIN ASPART 100 UNIT/ML ~~LOC~~ SOLN
5.0000 [IU] | Freq: Three times a day (TID) | SUBCUTANEOUS | Status: DC
  Administered 2015-11-07 – 2015-11-08 (×3): 5 [IU] via SUBCUTANEOUS
  Filled 2015-11-07 (×3): qty 5

## 2015-11-07 MED ORDER — SODIUM CHLORIDE 0.9 % IJ SOLN
3.0000 mL | INTRAMUSCULAR | Status: DC | PRN
  Administered 2015-11-07 – 2015-11-09 (×3): 3 mL via INTRAVENOUS
  Filled 2015-11-07 (×3): qty 10

## 2015-11-07 MED ORDER — OXYCODONE HCL 5 MG PO TABS
10.0000 mg | ORAL_TABLET | ORAL | Status: DC | PRN
  Administered 2015-11-07 – 2015-11-08 (×4): 10 mg via ORAL
  Filled 2015-11-07 (×5): qty 2

## 2015-11-07 MED ORDER — SODIUM CHLORIDE 0.9 % IJ SOLN
3.0000 mL | Freq: Two times a day (BID) | INTRAMUSCULAR | Status: DC
  Administered 2015-11-07 – 2015-11-08 (×3): 3 mL via INTRAVENOUS

## 2015-11-07 MED ORDER — MORPHINE SULFATE (PF) 2 MG/ML IV SOLN: 2.0000 mg | INTRAVENOUS | Status: DC | PRN

## 2015-11-07 MED ORDER — DOCUSATE SODIUM 100 MG PO CAPS
100.0000 mg | ORAL_CAPSULE | Freq: Two times a day (BID) | ORAL | Status: DC
  Administered 2015-11-07 – 2015-11-14 (×13): 100 mg via ORAL
  Filled 2015-11-07 (×13): qty 1

## 2015-11-07 MED ORDER — MORPHINE SULFATE (PF) 4 MG/ML IV SOLN
4.0000 mg | INTRAVENOUS | Status: DC | PRN
  Administered 2015-11-08 (×2): 4 mg via INTRAVENOUS
  Filled 2015-11-07 (×2): qty 1

## 2015-11-07 MED ORDER — FENTANYL CITRATE (PF) 100 MCG/2ML IJ SOLN: 25.0000 ug | INTRAMUSCULAR | Status: DC | PRN

## 2015-11-07 MED ORDER — PROPOFOL 10 MG/ML IV BOLUS
INTRAVENOUS | Status: DC | PRN
  Administered 2015-11-07 (×4): 50 mg via INTRAVENOUS

## 2015-11-07 MED ORDER — AMLODIPINE BESYLATE 5 MG PO TABS
5.0000 mg | ORAL_TABLET | Freq: Every day | ORAL | Status: DC
  Administered 2015-11-07 – 2015-11-14 (×8): 5 mg via ORAL
  Filled 2015-11-07 (×8): qty 1

## 2015-11-07 MED ORDER — METFORMIN HCL 500 MG PO TABS
500.0000 mg | ORAL_TABLET | Freq: Two times a day (BID) | ORAL | Status: DC
  Administered 2015-11-07 – 2015-11-14 (×12): 500 mg via ORAL
  Filled 2015-11-07 (×12): qty 1

## 2015-11-07 MED ORDER — ONDANSETRON HCL 4 MG/2ML IJ SOLN: 4.0000 mg | Freq: Once | INTRAMUSCULAR | Status: DC | PRN

## 2015-11-07 MED ORDER — MORPHINE BOLUS VIA INFUSION: 2.0000 mg | INTRAVENOUS | Status: DC | PRN

## 2015-11-07 MED ORDER — LACTATED RINGERS IV SOLN
INTRAVENOUS | Status: DC | PRN
  Administered 2015-11-07: 08:00:00 via INTRAVENOUS

## 2015-11-07 MED ORDER — MORPHINE SULFATE (PF) 4 MG/ML IV SOLN
4.0000 mg | Freq: Once | INTRAVENOUS | Status: AC
  Administered 2015-11-07: 4 mg via INTRAVENOUS
  Filled 2015-11-07: qty 1

## 2015-11-07 MED ORDER — NEOMYCIN-POLYMYXIN B GU 40-200000 IR SOLN
Status: DC | PRN
  Administered 2015-11-07: 2 mL

## 2015-11-07 MED ORDER — POLYETHYLENE GLYCOL 3350 17 G PO PACK
17.0000 g | PACK | Freq: Every day | ORAL | Status: DC
  Administered 2015-11-07 – 2015-11-12 (×3): 17 g via ORAL
  Filled 2015-11-07 (×6): qty 1

## 2015-11-07 MED ORDER — BUPIVACAINE HCL 0.5 % IJ SOLN
INTRAMUSCULAR | Status: DC | PRN
  Administered 2015-11-07: 17 mL

## 2015-11-07 SURGICAL SUPPLY — 42 items
BANDAGE ACE 4X5 VEL STRL LF (GAUZE/BANDAGES/DRESSINGS) ×3 IMPLANT
BLADE MED AGGRESSIVE (BLADE) ×3 IMPLANT
BLADE OSC/SAGITTAL MD 5.5X18 (BLADE) IMPLANT
BLADE SURG 15 STRL LF DISP TIS (BLADE) ×3 IMPLANT
BLADE SURG 15 STRL SS (BLADE) ×6
BLADE SURG MINI STRL (BLADE) IMPLANT
BNDG ESMARK 4X12 TAN STRL LF (GAUZE/BANDAGES/DRESSINGS) IMPLANT
BNDG GAUZE 4.5X4.1 6PLY STRL (MISCELLANEOUS) ×3 IMPLANT
CANISTER SUCT 1200ML W/VALVE (MISCELLANEOUS) ×3 IMPLANT
CLOSURE WOUND 1/4X4 (GAUZE/BANDAGES/DRESSINGS) ×1
CUFF TOURN 18 STER (MISCELLANEOUS) ×3 IMPLANT
CUFF TOURN DUAL PL 12 NO SLV (MISCELLANEOUS) IMPLANT
DRAPE FLUOR MINI C-ARM 54X84 (DRAPES) IMPLANT
DURAPREP 26ML APPLICATOR (WOUND CARE) IMPLANT
GAUZE PETRO XEROFOAM 1X8 (MISCELLANEOUS) ×3 IMPLANT
GAUZE SPONGE 4X4 12PLY STRL (GAUZE/BANDAGES/DRESSINGS) ×3 IMPLANT
GAUZE STRETCH 2X75IN STRL (MISCELLANEOUS) ×3 IMPLANT
GLOVE BIO SURGEON STRL SZ7.5 (GLOVE) ×6 IMPLANT
GLOVE INDICATOR 8.0 STRL GRN (GLOVE) ×6 IMPLANT
GOWN STRL REUS W/ TWL LRG LVL3 (GOWN DISPOSABLE) ×2 IMPLANT
GOWN STRL REUS W/TWL LRG LVL3 (GOWN DISPOSABLE) ×4
HANDPIECE VERSAJET DEBRIDEMENT (MISCELLANEOUS) ×3 IMPLANT
KIT RM TURNOVER STRD PROC AR (KITS) ×3 IMPLANT
LABEL OR SOLS (LABEL) ×3 IMPLANT
NEEDLE FILTER BLUNT 18X 1/2SAF (NEEDLE) ×2
NEEDLE FILTER BLUNT 18X1 1/2 (NEEDLE) ×1 IMPLANT
NEEDLE HYPO 25X1 1.5 SAFETY (NEEDLE) ×6 IMPLANT
NS IRRIG 500ML POUR BTL (IV SOLUTION) ×3 IMPLANT
PACK EXTREMITY ARMC (MISCELLANEOUS) ×3 IMPLANT
PAD GROUND ADULT SPLIT (MISCELLANEOUS) ×3 IMPLANT
SOL .9 NS 3000ML IRR  AL (IV SOLUTION) ×2
SOL .9 NS 3000ML IRR UROMATIC (IV SOLUTION) ×1 IMPLANT
SOL PREP PVP 2OZ (MISCELLANEOUS) ×6
SOLUTION PREP PVP 2OZ (MISCELLANEOUS) ×2 IMPLANT
STOCKINETTE STRL 6IN 960660 (GAUZE/BANDAGES/DRESSINGS) ×3 IMPLANT
STRIP CLOSURE SKIN 1/4X4 (GAUZE/BANDAGES/DRESSINGS) ×2 IMPLANT
SUT ETHILON 4-0 (SUTURE) ×4
SUT ETHILON 4-0 FS2 18XMFL BLK (SUTURE) ×2
SUT ETHILON 5-0 FS-2 18 BLK (SUTURE) ×3 IMPLANT
SUTURE ETHLN 4-0 FS2 18XMF BLK (SUTURE) ×2 IMPLANT
SWAB DUAL CULTURE TRANS RED ST (MISCELLANEOUS) ×3 IMPLANT
SYRINGE 10CC LL (SYRINGE) ×3 IMPLANT

## 2015-11-07 NOTE — H&P (View-Only) (Signed)
2 Days Post-Op  Subjective: Patient seen. Still having some significant pain in his right foot and great toe area.  Objective: Vital signs in last 24 hours: Temp:  [98 F (36.7 C)-98.7 F (37.1 C)] 98 F (36.7 C) (01/01 1102) Pulse Rate:  [72-96] 76 (01/01 1102) Resp:  [17-18] 18 (01/01 1102) BP: (146-177)/(74-100) 157/89 mmHg (01/01 1102) SpO2:  [91 %-99 %] 95 % (01/01 1102) Weight:  [78.427 kg (172 lb 14.4 oz)] 78.427 kg (172 lb 14.4 oz) (01/01 0557) Last BM Date: 10/30/15  Intake/Output from previous day: 12/31 0701 - 01/01 0700 In: 1171 [P.O.:1071; IV Piggyback:100] Out: 1100 [Urine:1100] Intake/Output this shift: Total I/O In: 100 [IV Piggyback:100] Out: 400 [Urine:400]  Upon removal of the bandage there is progressive cyanosis and gangrenous changes to the entire right hallux. Full-thickness ulcerative area along the medial aspect which appears to probe down to the bone level. Incision along the medial first metatarsal from vascular surgery I&D is well coapted and stable with no significant drainage some mottled ecchymotic areas are noted in the distal forefoot and midfoot region mostly around the medial aspect of the foot. Significant pain on any physical palpation or motion  Lab Results:   Recent Labs  11/06/15 0420  WBC 8.6  HGB 11.5*  HCT 34.5*  PLT 249   BMET  Recent Labs  11/06/15 0420  NA 140  K 2.7*  CL 101  CO2 31  GLUCOSE 153*  BUN 23*  CREATININE 0.79  CALCIUM 8.2*   PT/INR No results for input(s): LABPROT, INR in the last 72 hours. ABG No results for input(s): PHART, HCO3 in the last 72 hours.  Invalid input(s): PCO2, PO2  Studies/Results: Dg Chest Port 1 View  11/05/2015  CLINICAL DATA:  PICC line placement. EXAM: PORTABLE CHEST 1 VIEW COMPARISON:  10/30/2015 FINDINGS: Right-sided PICC line has been placed, tip overlying the level of the lower superior vena cava. The heart is enlarged. There is mild elevation of the right hemidiaphragm  which is stable. Right basilar atelectasis is present. There is no pneumothorax. Cervical hardware consistent with anterior fusion. IMPRESSION: Interval placement of right-sided PICC line. Cardiomegaly. Electronically Signed   By: Norva Pavlov M.D.   On: 11/05/2015 12:01    Anti-infectives: Anti-infectives    Start     Dose/Rate Route Frequency Ordered Stop   11/02/15 1800  Ampicillin-Sulbactam (UNASYN) 3 g in sodium chloride 0.9 % 100 mL IVPB     3 g 100 mL/hr over 60 Minutes Intravenous Every 6 hours 11/02/15 1522     11/02/15 1400  piperacillin-tazobactam (ZOSYN) IVPB 3.375 g  Status:  Discontinued     3.375 g 12.5 mL/hr over 240 Minutes Intravenous 3 times per day 11/02/15 1350 11/02/15 1444   10/31/15 1130  Ampicillin-Sulbactam (UNASYN) 3 g in sodium chloride 0.9 % 100 mL IVPB  Status:  Discontinued     3 g 100 mL/hr over 60 Minutes Intravenous Every 6 hours 10/31/15 1035 11/02/15 1334   10/31/15 0500  vancomycin (VANCOCIN) IVPB 1000 mg/200 mL premix  Status:  Discontinued     1,000 mg 200 mL/hr over 60 Minutes Intravenous Every 12 hours 10/30/15 2248 11/02/15 1333   10/30/15 2130  vancomycin (VANCOCIN) IVPB 1000 mg/200 mL premix  Status:  Discontinued     1,000 mg 200 mL/hr over 60 Minutes Intravenous  Once 10/30/15 2120 10/30/15 2141   10/30/15 1645  piperacillin-tazobactam (ZOSYN) IVPB 3.375 g     3.375 g 100 mL/hr over 30  Minutes Intravenous  Once 10/30/15 1633 10/30/15 2019   10/30/15 1645  vancomycin (VANCOCIN) IVPB 1000 mg/200 mL premix     1,000 mg 200 mL/hr over 60 Minutes Intravenous  Once 10/30/15 1633 10/30/15 2019      Assessment/Plan: s/p Procedure(s): DEBRIDEMENT WOUND (Right) Assessment: Gangrenous changes right hallux status post vascular intervention   Plan: Discussed with the patient the need for amputation of his right great toe at this point. We also discussed the possibility of having to remove some of the first metatarsal to be able to get the wound  closed due to the amount of devitalized tissue. Discussed with the patient risks and complications including the main risk of nonhealing due to his vascular status or diabetes. Also discussed the possibility of further amputation if it does not heal. We will obtain a consent for amputation of the right great toe with possible partial first ray resection. Nothing by mouth after midnight. We will plan on surgery tomorrow morning.  LOS: 7 days    Jerid Catherman W. 11/06/2015

## 2015-11-07 NOTE — Clinical Social Work Note (Signed)
Patient had amputation today. CSW has updated Selena BattenKim at [facility]. CSW has left message for Amy with [employer] to update her as their office is closed today. York SpanielMonica Legend Tumminello MSW,LCSW [phone]

## 2015-11-07 NOTE — Progress Notes (Signed)
Pt returned from PACU S/P rt great toe amputation.  Drowsy, but oriented x 3.  Dressing to rt foot dry and intact.  Family at bedside.  CB in reach, SR up x3, bed alarm on.

## 2015-11-07 NOTE — Progress Notes (Signed)
TO OR via bed.

## 2015-11-07 NOTE — Consult Note (Signed)
ANTIBIOTIC CONSULT NOTE -FOLLOW UP   Pharmacy Consult for ampicillin/sulbactam  Indication: MSSA Bacteremia and Providencia in Wound   Allergies  Allergen Reactions  . Dilaudid [Hydromorphone] Other (See Comments)    Agitation   Patient Measurements: Height: 5\' 8"  (172.7 cm) Weight: 171 lb 12.5 oz (77.92 kg) IBW/kg (Calculated) : 68.4  Vital Signs: Temp: 97.5 F (36.4 C) (01/02 0916) Temp Source: Oral (01/02 0720) BP: 140/94 mmHg (01/02 0916) Pulse Rate: 75 (01/02 0916)  Recent Labs  11/06/15 0420 11/07/15 0516  WBC 8.6  --   HGB 11.5*  --   PLT 249  --   CREATININE 0.79 0.75   Estimated Creatinine Clearance: 91.4 mL/min (by C-G formula based on Cr of 0.75).  Assessment: Pharmacy is dosing ampicillin/sulbactam in this in 65 year old male being treated for MSSA bacteremia as well as a lower extremity wound infection growing MSSA and providencia, all of which is susceptible to ampicillin/sulbactam. ID is following the patient and anticipates a 2-6 week course of antibiotics depending on if treated osteomyelitis. Patient underwent angioplasty on 12/30 and is undergoing amputation of right great toe today (11/07/15).   Patient is currently prescribed ampicillin/sulbactam 3 g IV q 6 hours and is on day #9 of antibiotics. CrCl ~ 91 mL/min.  Plan:  Continue current regimen of ampicillin/sulbactam 3 g IV q 6 hours   Pharmacy will continue to monitor, thank you for the consult.   Cindi CarbonMary M Draya Felker, PharmD  11/07/2015,9:44 AM

## 2015-11-07 NOTE — Care Management Important Message (Signed)
Important Message  Patient Details  Name: Daniel [name] MRN: [id number] Date of Birth: 11/01/1951   Medicare Important Message Given:  Yes    Olegario MessierKathy A Rishab Stoudt 11/07/2015, 10:04 AM

## 2015-11-07 NOTE — Interval H&P Note (Signed)
History and Physical Interval Note:  11/07/2015 9:13 AM  Daniel [name]  has presented today for surgery, with the diagnosis of right great toe infection  The various methods of treatment have been discussed with the patient and family. After consideration of risks, benefits and other options for treatment, the patient has consented to  Procedure(s): AMPUTATION TOE and 1st ray amputation (Right) as a surgical intervention .  The patient's history has been reviewed, patient examined, no change in status, stable for surgery.  I have reviewed the patient's chart and labs.  Questions were answered to the patient's satisfaction.     Lalitha Ilyas W.

## 2015-11-07 NOTE — Anesthesia Postprocedure Evaluation (Signed)
Anesthesia Post Note  Patient: Glennon HamiltonAllen Cousin  Procedure(s) Performed: Procedure(s) (LRB): AMPUTATION TOE and 1st ray amputation (Right)  Patient location during evaluation: PACU Anesthesia Type: MAC Level of consciousness: awake Pain management: pain level controlled Vital Signs Assessment: post-procedure vital signs reviewed and stable Respiratory status: respiratory function stable Cardiovascular status: stable Anesthetic complications: no    Last Vitals:  Filed Vitals:   11/07/15 0915 11/07/15 0916  BP:  140/94  Pulse:  75  Temp: 36.4 C 36.4 C  Resp:  13    Last Pain:  Filed Vitals:   11/07/15 0922  PainSc: Asleep                 VAN STAVEREN,Samyia Motter

## 2015-11-07 NOTE — Progress Notes (Signed)
Inpatient Diabetes Program Recommendations  AACE/ADA: New Consensus Statement on Inpatient Glycemic Control (2015)  Target Ranges:  Prepandial:   less than 140 mg/dL      Peak postprandial:   less than 180 mg/dL (1-2 hours)      Critically ill patients:  140 - 180 mg/dL  Results for Glennon HamiltonBARNETTE, Joron (MRN [id number]) as of 11/07/2015 11:09  Ref. Range 11/06/2015 07:17 11/06/2015 11:03 11/06/2015 16:40 11/06/2015 20:51 11/07/2015 10:30  Glucose-Capillary Latest Ref Range: 65-99 mg/dL 782139 (H) 956209 (H) 213228 (H) 253 (H) 101 (H)   Review of Glycemic Control  Current orders for Inpatient glycemic control: Glipizide 10 mg BID, Levemir 5 units QHS, Novolog 0-9 units ACHS  Inpatient Diabetes Program Recommendations: Insulin - Meal Coverage: Post prandial glucose is consistently elevated. Please consider ordering Novolog 5 units TID with meals for meal coverage.  Thanks, Orlando PennerMarie Shaketta Rill, RN, MSN, CDE Diabetes Coordinator Inpatient Diabetes Program [phone] (Team Pager from 8am to 5pm) [phone] ([facility] office) [phone] [facility] office) [phone] [facility] office)

## 2015-11-07 NOTE — Op Note (Signed)
Date of operation: 11/07/2015.  Surgeon: Ricci Barkerodd W Maryhelen Lindler DPM.  Preoperative diagnosis: Gangrene with osteomyelitis right hallux.  Postoperative diagnosis: Same.  Procedure: Amputation right great toe with partial first ray resection.  Anesthesia: Local Mac.  Hemostasis: None.  Estimated blood loss: 25 cc.  Pathology: Right great toe and distal first metatarsal.  Cultures: Bone cultures right great toe.  Implants: None.  Complications: None apparent.  Operative indications: This is a 65 year old male admitted last week for cellulitis in his right foot with suspected infection in the great toe. He had a chronic ulceration on the great toe. He did undergo revascularization last week as well as an I&D of the forefoot to evaluate for abscess. The right great toe continued to turn dusky and gangrenous with a full-thickness ulceration on the great toe and decision was made for amputation with partial ray resection of the first metatarsal.  Operative procedure: Patient was taken to the operating room and placed on the table in the supine position. Following satisfactory sedation the right foot was anesthetized with 10 cc of 0.5% Sensorcaine plain. The foot was then prepped and draped in the usual sterile fashion. Attention was then directed to the distal aspect of the right foot where the right great toe was noted to be obviously gangrenous. An elliptical incision was made from lateral to medial around the base of the great toe at the juncture of the healthy and gangrenous tissue. The incision was carried sharply down to the level of bone and was connected to the medial incision from the previous I&D. 2 of the previous vertical mattress sutures were removed 4 more exposure of the bone. Minimal bleeding was encountered during the initial portion of the procedure. The great toe was then disarticulated at the metatarsophalangeal joint and removed in toto. Using a pair of ronguer's a bone culture was  taken from the medial aspect of the proximal phalanx area on the great toe. At this point there was noted to be significant purulence with some devitalized tissue along the plantar aspect of the foot tracking along the flexor tendon sheath area. Skin closure was also unable to be performed over the first metatarsal head. Using a sagittal saw the distal aspect of the first metatarsal was transected and removed in toto. Good healthy proximal bone was noted on probing. Using a versa jet debrider on a setting of 6 the devitalized tissues were then redirected from the plantar aspect of the wound to a level of good healthy bleeding tissues. The wound was then copiously irrigated and debrided with the versa jet debrider on a setting of 3 as well as a bulb syringe with sterile saline. The wound was then reapproximated and closed using 4-0 nylon vertical mattress and simple interrupted sutures. Xeroform and a sterile gauze bandage was applied to the right foot and lower extremity followed by an Ace wrap. Patient tolerated the procedure and anesthesia well and was transported to the PACU with vital signs stable and in good condition.

## 2015-11-07 NOTE — Transfer of Care (Signed)
Immediate Anesthesia Transfer of Care Note  Patient: Daniel [name]  Procedure(s) Performed: Procedure(s): AMPUTATION TOE and 1st ray amputation (Right)  Patient Location: PACU  Anesthesia Type:MAC  Level of Consciousness: awake and alert   Airway & Oxygen Therapy: Patient Spontanous Breathing  Post-op Assessment: Report given to RN  Post vital signs: stable  Last Vitals:  Filed Vitals:   11/07/15 0418 11/07/15 0720  BP: 142/81 164/87  Pulse: 76 80  Temp: 37.4 C 37 C  Resp: 18 17    Complications: No apparent anesthesia complications

## 2015-11-07 NOTE — Anesthesia Preprocedure Evaluation (Signed)
Anesthesia Evaluation  Patient identified by MRN, date of birth, ID band Patient awake    Airway Mallampati: II       Dental no notable dental hx.    Pulmonary neg pulmonary ROS,    breath sounds clear to auscultation       Cardiovascular hypertension, + angina + Past MI and + Cardiac Stents   Rhythm:Regular     Neuro/Psych    GI/Hepatic negative GI ROS, Neg liver ROS,   Endo/Other  diabetes, Type 1, Insulin Dependent  Renal/GU negative Renal ROS     Musculoskeletal negative musculoskeletal ROS (+)   Abdominal (+) + obese,   Peds  Hematology  (+) anemia ,   Anesthesia Other Findings   Reproductive/Obstetrics negative OB ROS                             Anesthesia Physical Anesthesia Plan  ASA: III  Anesthesia Plan: MAC   Post-op Pain Management:    Induction: Intravenous  Airway Management Planned: Nasal Cannula  Additional Equipment:   Intra-op Plan:   Post-operative Plan:   Informed Consent: I have reviewed the patients History and Physical, chart, labs and discussed the procedure including the risks, benefits and alternatives for the proposed anesthesia with the patient or authorized representative who has indicated his/her understanding and acceptance.     Plan Discussed with: CRNA  Anesthesia Plan Comments:         Anesthesia Quick Evaluation

## 2015-11-07 NOTE — Progress Notes (Signed)
[facility] [place] [facility]                                                                                                                                                                                            Patient Demographics   Daniel Hamiltonllen Koenig, is a 65 y.o. male, DOB - 06/24/1951, ZOX:[id number]  Admit date - 10/30/2015   Admitting Physician Ramonita LabAruna Gouru, MD  Outpatient Primary MD for the patient is [name],[name], MD   LOS - 8  Subjective: Patient admitted with shortness of breath,  right toe erythema and pain, also left sided new eye visual difficulties. Continues to have pain right foot. SOB improved. No CP. Afebrile. Off Oxygen  Angio and I& D of right toe is done 11/04/15, less pain. 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  Review of Systems:   CONSTITUTIONAL: No documented fever. No fatigue, weakness. No weight gain, no weight loss.  EYES: No blurry or double vision. Left eye visual difficulties ENT: No tinnitus. No postnasal drip. No redness of the oropharynx.  RESPIRATORY: No cough, no wheeze, no hemoptysis. Positive dyspnea.  CARDIOVASCULAR: No chest pain. No orthopnea. No palpitations. No syncope.  GASTROINTESTINAL: No nausea, no vomiting or diarrhea. No abdominal pain. No melena or hematochezia.  GENITOURINARY: No dysuria or hematuria.  ENDOCRINE: No polyuria or nocturia. No heat or cold intolerance.  HEMATOLOGY: No anemia. No bruising. No bleeding.  INTEGUMENTARY: No rashes. No lesions.  MUSCULOSKELETAL: No arthritis. No swelling. No gout. Right foot pain. NEUROLOGIC: No numbness, tingling, or ataxia. No seizure-type activity. Generalized weakness PSYCHIATRIC: No anxiety. No insomnia. No ADD.     Vitals:   Filed Vitals:   11/07/15 1001 11/07/15 1007 11/07/15 1018 11/07/15 1158  BP:  179/98 183/90 157/81  Pulse:   81 87  Temp: 99 F (37.2 C)  98.2 F (36.8 C) 97.8 F (36.6 C)  TempSrc:   Oral   Resp:   16 20   Height:      Weight:      SpO2:   96% 94%    Wt Readings from Last 3 Encounters:  11/07/15 77.92 kg (171 lb 12.5 oz)  04/27/15 74.844 kg (165 lb)     Intake/Output Summary (Last 24 hours) at 11/07/15 1519 Last data filed at 11/07/15 1416  Gross per 24 hour  Intake    843 ml  Output   1700 ml  Net   -857 ml    Physical Exam:   GENERAL: Pleasant-appearing in no apparent distress.  HEAD, EYES, EARS, NOSE AND THROAT: Atraumatic, normocephalic. Extraocular muscles are intact. Pupils equal  and reactive to light. Sclerae anicteric. No conjunctival injection. No oro-pharyngeal erythema.  NECK: Supple. There is no jugular venous distention. No bruits, no lymphadenopathy, no thyromegaly.  HEART: Regular rate and rhythm,. No murmurs, no rubs, no clicks.  LUNGS: Bilateral crackles at the bases ABDOMEN: Soft, flat, nontender, nondistended. Has good bowel sounds. No hepatosplenomegaly appreciated.  EXTREMITIES: right foot redenss, with cynosis on great toe, with tenderness and swelling on whole foot. NEUROLOGIC: The patient is alert, awake, and oriented x3 with no focal motor deficits appreciated bilaterally. Decreased sensations feet. SKIN: Right great toe erythematous surrounding cellulitis- dressing present on right foot. Psych: Not anxious, depressed LN: No inguinal LN enlargement Right foot redness with gangrene great toe.   Antibiotics   Anti-infectives    Start     Dose/Rate Route Frequency Ordered Stop   11/02/15 1800  Ampicillin-Sulbactam (UNASYN) 3 g in sodium chloride 0.9 % 100 mL IVPB     3 g 100 mL/hr over 60 Minutes Intravenous Every 6 hours 11/02/15 1522     11/02/15 1400  piperacillin-tazobactam (ZOSYN) IVPB 3.375 g  Status:  Discontinued     3.375 g 12.5 mL/hr over 240 Minutes Intravenous 3 times per day 11/02/15 1350 11/02/15 1444   10/31/15 1130  Ampicillin-Sulbactam (UNASYN) 3 g in sodium chloride 0.9 % 100 mL IVPB  Status:  Discontinued     3 g 100 mL/hr over 60  Minutes Intravenous Every 6 hours 10/31/15 1035 11/02/15 1334   10/31/15 0500  vancomycin (VANCOCIN) IVPB 1000 mg/200 mL premix  Status:  Discontinued     1,000 mg 200 mL/hr over 60 Minutes Intravenous Every 12 hours 10/30/15 2248 11/02/15 1333   10/30/15 2130  vancomycin (VANCOCIN) IVPB 1000 mg/200 mL premix  Status:  Discontinued     1,000 mg 200 mL/hr over 60 Minutes Intravenous  Once 10/30/15 2120 10/30/15 2141   10/30/15 1645  piperacillin-tazobactam (ZOSYN) IVPB 3.375 g     3.375 g 100 mL/hr over 30 Minutes Intravenous  Once 10/30/15 1633 10/30/15 2019   10/30/15 1645  vancomycin (VANCOCIN) IVPB 1000 mg/200 mL premix     1,000 mg 200 mL/hr over 60 Minutes Intravenous  Once 10/30/15 1633 10/30/15 2019      Medications   Scheduled Meds: . amLODipine  5 mg Oral Daily  . ampicillin-sulbactam (UNASYN) IV  3 g Intravenous Q6H  . aspirin EC  81 mg Oral Daily  . atorvastatin  40 mg Oral QHS  . docusate sodium  100 mg Oral BID  . enoxaparin (LOVENOX) injection  40 mg Subcutaneous Q24H  . furosemide  40 mg Oral BID  . glipiZIDE  10 mg Oral BID AC  . insulin aspart  0-9 Units Subcutaneous TID AC & HS  . insulin aspart  5 Units Subcutaneous TID WC  . insulin detemir  5 Units Subcutaneous QHS  . losartan  100 mg Oral Daily  . metoprolol tartrate  25 mg Oral BID  . pantoprazole  40 mg Oral QAC breakfast  . polyethylene glycol  17 g Oral Daily  . potassium chloride  40 mEq Oral BID  . pregabalin  75 mg Oral BID  . sodium chloride  3 mL Intravenous Q12H   Continuous Infusions:   PRN Meds:.sodium chloride, acetaminophen, morphine injection, nitroGLYCERIN, ondansetron (ZOFRAN) IV, oxyCODONE, sodium chloride   Data Review:   Micro Results Recent Results (from the past 240 hour(s))  Culture, blood (routine x 2)     Status: None   Collection  Time: 10/30/15  4:44 PM  Result Value Ref Range Status   Specimen Description BLOOD LEFT WRIST  Final   Special Requests BOTTLES DRAWN  AEROBIC AND ANAEROBIC  2CC  Final   Culture  Setup Time   Final    GRAM POSITIVE COCCI IN CLUSTERS AEROBIC BOTTLE ONLY CRITICAL RESULT CALLED TO, READ BACK BY AND VERIFIED WITH: JASON ROBBINS AT 1710 ON 10/31/15 CTJ    Culture STAPHYLOCOCCUS AUREUS AEROBIC BOTTLE ONLY   Final   Report Status 11/04/2015 FINAL  Final   Organism ID, Bacteria STAPHYLOCOCCUS AUREUS  Final      Susceptibility   Staphylococcus aureus - MIC*    CIPROFLOXACIN <=0.5 SENSITIVE Sensitive     ERYTHROMYCIN 0.5 SENSITIVE Sensitive     GENTAMICIN <=0.5 SENSITIVE Sensitive     OXACILLIN <=0.25 SENSITIVE Sensitive     TRIMETH/SULFA <=10 SENSITIVE Sensitive     CLINDAMYCIN <=0.25 SENSITIVE Sensitive     CEFOXITIN SCREEN NEGATIVE Sensitive     Inducible Clindamycin NEGATIVE Sensitive     TETRACYCLINE Value in next row Sensitive      SENSITIVE<=1    * STAPHYLOCOCCUS AUREUS  Blood Culture ID Panel (Reflexed)     Status: Abnormal   Collection Time: 10/30/15  4:44 PM  Result Value Ref Range Status   Enterococcus species NOT DETECTED NOT DETECTED Final   Listeria monocytogenes NOT DETECTED NOT DETECTED Final   Staphylococcus species DETECTED (A) NOT DETECTED Corrected    Comment: CORRECTED ON 12/27 AT 0801: PREVIOUSLY REPORTED AS NOT DETECTED   Staphylococcus aureus DETECTED (A) NOT DETECTED Final    Comment: CRITICAL RESULT CALLED TO, READ BACK BY AND VERIFIED WITH: JASON ROBBINS AT 1710 ON 10/31/15 CTJ    Streptococcus species NOT DETECTED NOT DETECTED Final   Streptococcus agalactiae NOT DETECTED NOT DETECTED Final   Streptococcus pneumoniae NOT DETECTED NOT DETECTED Final   Streptococcus pyogenes NOT DETECTED NOT DETECTED Final   Acinetobacter baumannii NOT DETECTED NOT DETECTED Final   Enterobacteriaceae species NOT DETECTED NOT DETECTED Final   Enterobacter cloacae complex NOT DETECTED NOT DETECTED Final   Escherichia coli NOT DETECTED NOT DETECTED Final   Klebsiella oxytoca NOT DETECTED NOT DETECTED Final    Klebsiella pneumoniae NOT DETECTED NOT DETECTED Final   Proteus species NOT DETECTED NOT DETECTED Final   Serratia marcescens NOT DETECTED NOT DETECTED Final   Haemophilus influenzae NOT DETECTED NOT DETECTED Final   Neisseria meningitidis NOT DETECTED NOT DETECTED Final   Pseudomonas aeruginosa NOT DETECTED NOT DETECTED Final   Candida albicans NOT DETECTED NOT DETECTED Final   Candida glabrata NOT DETECTED NOT DETECTED Final   Candida krusei NOT DETECTED NOT DETECTED Final   Candida parapsilosis NOT DETECTED NOT DETECTED Final   Candida tropicalis NOT DETECTED NOT DETECTED Final   Carbapenem resistance NOT DETECTED NOT DETECTED Final   Methicillin resistance NOT DETECTED NOT DETECTED Final   Vancomycin resistance NOT DETECTED NOT DETECTED Final  Culture, blood (routine x 2)     Status: None   Collection Time: 10/30/15  5:31 PM  Result Value Ref Range Status   Specimen Description BLOOD LEFT HAND  Final   Special Requests BOTTLES DRAWN AEROBIC AND ANAEROBIC  2CC  Final   Culture  Setup Time   Final    GRAM POSITIVE COCCI IN CHAINS IN BOTH AEROBIC AND ANAEROBIC BOTTLES CRITICAL RESULT CALLED TO, READ BACK BY AND VERIFIED WITH: KAREN HAYES AT 1045 10/31/15 CTJ    Culture   Final  VIRIDANS STREPTOCOCCUS IN BOTH AEROBIC AND ANAEROBIC BOTTLES MULTIPLE SPECIES PRESENT Results consistent with contamination.    Report Status 11/02/2015 FINAL  Final  Blood Culture ID Panel (Reflexed)     Status: Abnormal   Collection Time: 10/30/15  5:31 PM  Result Value Ref Range Status   Enterococcus species NOT DETECTED NOT DETECTED Final   Listeria monocytogenes NOT DETECTED NOT DETECTED Final   Staphylococcus species NOT DETECTED NOT DETECTED Final   Staphylococcus aureus NOT DETECTED NOT DETECTED Final   Streptococcus species DETECTED (A) NOT DETECTED Final    Comment: CRITICAL RESULT CALLED TO, READ BACK BY AND VERIFIED WITH: KAREN HAYES AT 1045 ON 10/31/15 CTJ    Streptococcus agalactiae  NOT DETECTED NOT DETECTED Final   Streptococcus pneumoniae NOT DETECTED NOT DETECTED Final   Streptococcus pyogenes NOT DETECTED NOT DETECTED Final   Acinetobacter baumannii NOT DETECTED NOT DETECTED Final   Enterobacteriaceae species NOT DETECTED NOT DETECTED Final   Enterobacter cloacae complex NOT DETECTED NOT DETECTED Final   Escherichia coli NOT DETECTED NOT DETECTED Final   Klebsiella oxytoca NOT DETECTED NOT DETECTED Final   Klebsiella pneumoniae NOT DETECTED NOT DETECTED Final   Proteus species NOT DETECTED NOT DETECTED Final   Serratia marcescens NOT DETECTED NOT DETECTED Final   Haemophilus influenzae NOT DETECTED NOT DETECTED Final   Neisseria meningitidis NOT DETECTED NOT DETECTED Final   Pseudomonas aeruginosa NOT DETECTED NOT DETECTED Final   Candida albicans NOT DETECTED NOT DETECTED Final   Candida glabrata NOT DETECTED NOT DETECTED Final   Candida krusei NOT DETECTED NOT DETECTED Final   Candida parapsilosis NOT DETECTED NOT DETECTED Final   Candida tropicalis NOT DETECTED NOT DETECTED Final   Carbapenem resistance NOT DETECTED NOT DETECTED Final   Methicillin resistance NOT DETECTED NOT DETECTED Final   Vancomycin resistance NOT DETECTED NOT DETECTED Final  CULTURE, BLOOD (ROUTINE X 2) w Reflex to PCR ID Panel     Status: None   Collection Time: 10/31/15 11:35 AM  Result Value Ref Range Status   Specimen Description BLOOD RIGHT HAND  Final   Special Requests BOTTLES DRAWN AEROBIC AND ANAEROBIC 2CC  Final   Culture NO GROWTH 5 DAYS  Final   Report Status 11/05/2015 FINAL  Final  CULTURE, BLOOD (ROUTINE X 2) w Reflex to PCR ID Panel     Status: None   Collection Time: 10/31/15 11:35 AM  Result Value Ref Range Status   Specimen Description BLOOD RIGHT ASSIST CONTROL  Final   Special Requests BOTTLES DRAWN AEROBIC AND ANAEROBIC 2CC  Final   Culture NO GROWTH 5 DAYS  Final   Report Status 11/05/2015 FINAL  Final  Wound culture     Status: None   Collection Time:  10/31/15  2:12 PM  Result Value Ref Range Status   Specimen Description ULCER  Final   Special Requests NONE  Final   Gram Stain   Final    RARE WBC SEEN MODERATE GRAM POSITIVE COCCI IN PAIRS IN CLUSTERS RARE GRAM NEGATIVE RODS RARE GRAM POSITIVE RODS    Culture   Final    MODERATE GROWTH PROVIDENCIA RETTGERI HEAVY GROWTH STAPHYLOCOCCUS AUREUS    Report Status 11/03/2015 FINAL  Final   Organism ID, Bacteria PROVIDENCIA RETTGERI  Final   Organism ID, Bacteria STAPHYLOCOCCUS AUREUS  Final      Susceptibility   Staphylococcus aureus - MIC*    CIPROFLOXACIN <=0.5 SENSITIVE Sensitive     ERYTHROMYCIN <=0.25 SENSITIVE Sensitive     GENTAMICIN <=  0.5 SENSITIVE Sensitive     OXACILLIN <=0.25 SENSITIVE Sensitive     TRIMETH/SULFA <=10 SENSITIVE Sensitive     CLINDAMYCIN <=0.25 SENSITIVE Sensitive     CEFOXITIN SCREEN NEGATIVE Sensitive     Inducible Clindamycin NEGATIVE Sensitive     TETRACYCLINE Value in next row Sensitive      SENSITIVE<=1    * HEAVY GROWTH STAPHYLOCOCCUS AUREUS   Providencia rettgeri - MIC*    AMPICILLIN Value in next row Resistant      SENSITIVE<=1    CEFTAZIDIME Value in next row Sensitive      SENSITIVE<=1    CEFAZOLIN Value in next row Resistant      SENSITIVE<=1    CEFTRIAXONE Value in next row Sensitive      SENSITIVE<=1    CIPROFLOXACIN Value in next row Sensitive      SENSITIVE<=1    GENTAMICIN Value in next row Sensitive      SENSITIVE<=1    IMIPENEM Value in next row Sensitive      SENSITIVE<=1    TRIMETH/SULFA Value in next row Resistant      SENSITIVE<=1    PIP/TAZO Value in next row Sensitive      SENSITIVE<=4    AMPICILLIN/SULBACTAM Value in next row Sensitive      SENSITIVE<=2    * MODERATE GROWTH PROVIDENCIA RETTGERI  Culture, blood (Routine X 2) w Reflex to ID Panel     Status: None   Collection Time: 11/01/15  5:07 PM  Result Value Ref Range Status   Specimen Description BLOOD RIGHT ASSIST CONTROL  Final   Special Requests BOTTLES  DRAWN AEROBIC AND ANAEROBIC 5CC  Final   Culture NO GROWTH 5 DAYS  Final   Report Status 11/06/2015 FINAL  Final  Culture, blood (Routine X 2) w Reflex to ID Panel     Status: None   Collection Time: 11/01/15  5:07 PM  Result Value Ref Range Status   Specimen Description BLOOD RIGHT HAND  Final   Special Requests BOTTLES DRAWN AEROBIC AND ANAEROBIC 3CC  Final   Culture NO GROWTH 5 DAYS  Final   Report Status 11/06/2015 FINAL  Final  Anaerobic culture     Status: None (Preliminary result)   Collection Time: 11/04/15  7:20 PM  Result Value Ref Range Status   Specimen Description TOE RIGHT GREAT TOE  Final   Special Requests NONE  Final   Culture   Final    NO ANAEROBES ISOLATED; CULTURE IN PROGRESS FOR 5 DAYS   Report Status PENDING  Incomplete  Wound culture     Status: None   Collection Time: 11/04/15  7:20 PM  Result Value Ref Range Status   Specimen Description WOUND RIGHT GREAT TOE  Final   Special Requests NONE  Final   Gram Stain RARE WBC SEEN NO ORGANISMS SEEN   Final   Culture NO GROWTH 3 DAYS  Final   Report Status 11/07/2015 FINAL  Final    Radiology Reports Ct Head Wo Contrast  10/30/2015  CLINICAL DATA:  Generalized weakness, diabetes, hypertension, right visual disturbance EXAM: CT HEAD WITHOUT CONTRAST TECHNIQUE: Contiguous axial images were obtained from the base of the skull through the vertex without contrast. COMPARISON:  03/01/2011 FINDINGS: Mild brain atrophy and chronic white matter microvascular ischemic changes throughout the periventricular white matter. These changes have slightly progressed. No acute intracranial hemorrhage, mass lesion, definite infarction, midline shift, herniation, hydrocephalus, or extra-axial fluid collection. Cisterns patent. No cerebellar abnormality. Skull intact. Mastoids  and sinuses remain clear. Atherosclerosis of the intracranial vessels. IMPRESSION: Atrophy and slight progression of chronic white matter microvascular ischemic  changes. No acute intracranial process by noncontrast CT. Electronically Signed   By: Judie Petit.  Shick M.D.   On: 10/30/2015 18:15   Mr Laqueta Jean ZO Contrast  11/01/2015  CLINICAL DATA:  65 year old male with progressive lower extremity weakness for 6 months. Initial encounter. Diabetes EXAM: MRI HEAD WITHOUT AND WITH CONTRAST TECHNIQUE: Multiplanar, multiecho pulse sequences of the brain and surrounding structures were obtained without and with intravenous contrast. CONTRAST:  16mL MULTIHANCE GADOBENATE DIMEGLUMINE 529 MG/ML IV SOLN COMPARISON:  Head CT without contrast 10/30/2015. [facility] postoperative cervical spine radiograph 07/19/2011. FINDINGS: Major intracranial vascular flow voids are preserved. Late subacute to early chronic appearing right corona radiata 10 mm focus of diffusion signal change (series 100, image 36) with T2 shine through. No restricted diffusion or evidence of acute infarction. Additional patchy bilateral cerebral white matter T2 and FLAIR hyperintensity. Chronic lacunar infarct in the *SCRATCH* SPECT chronic lacunar infarcts in the right thalamus. Mild for age patchy T2 hyperintensity in the pons, but with associated pontine chronic micro hemorrhage (series 9, image 8). No cortical encephalomalacia. No midline shift, mass effect, evidence of mass lesion, ventriculomegaly, extra-axial collection or acute intracranial hemorrhage. Cervicomedullary junction and pituitary are within normal limits. Negative visualized cervical spine aside from hardware susceptibility artifact. No abnormal enhancement identified. Incidental anterior right frontal lobe developmental venous anomaly (normal anatomic variant). Visible internal auditory structures appear normal. Mastoids and paranasal sinuses are clear. Postoperative changes to the globes. Otherwise negative scalp and orbits soft tissues. Normal bone marrow signal. IMPRESSION: No acute intracranial abnormality. Moderate for  age chronic small vessel disease with late subacute lacune in the right corona radiata. Electronically Signed   By: Odessa Fleming M.D.   On: 11/01/2015 10:19   Mr Foot Right Wo Contrast  11/01/2015  CLINICAL DATA:  Ulceration in a about 7 mm along the medial right great toe with purulence noted. Erythema extends dorsally in the foot. EXAM: MRI OF THE RIGHT FOREFOOT WITHOUT CONTRAST TECHNIQUE: Multiplanar, multisequence MR imaging was performed. No intravenous contrast was administered. COMPARISON:  10/30/2015 FINDINGS: There is subcutaneous edema diffusely in the foot especially dorsally, and extending into the ankle both medially and laterally. Low-level edema extends into the toes. On image 32 series 4 there is a defect in the skin and subcutaneous tissues medially along the great toe approximately at the level of the distal phalanx. The on images 23-25 of series 7 there seems to be abnormal increased inversion recovery weighted signal in the base of the distal phalanx suspicious for early osteomyelitis given the immediately adjacent ulceration. Sharply defined erosions or cystic lesions are present along the Lisfranc joint is, including adjacent to the expected attachment site of the Lisfranc ligament to the second metatarsal base. However, there is no overt malalignment at the Lisfranc joint. Erosions are present at the articulation of the navicular and medial cuneiform. There is thickening of the medial band of the plantar fascia proximally. IMPRESSION: 1. Ulceration medially along the great toe, with underlying edema signal in the marrow of the base of the distal phalanx suspicious for early osteomyelitis. No other osteomyelitis identified. 2. Cystic or erosive lesions along significant portions of the Lisfranc joint and at the articulation of the distal navicular with the medial cuneiform, suspicious for erosive arthropathy such as rheumatoid arthropathy. An alternative would be early Charcot joint. 3. Edema  tracking  throughout the foot and ankle, especially dorsally, potentially from cellulitis. Electronically Signed   By: Gaylyn Rong M.D.   On: 11/01/2015 10:37   Dg Chest Port 1 View  11/05/2015  CLINICAL DATA:  PICC line placement. EXAM: PORTABLE CHEST 1 VIEW COMPARISON:  10/30/2015 FINDINGS: Right-sided PICC line has been placed, tip overlying the level of the lower superior vena cava. The heart is enlarged. There is mild elevation of the right hemidiaphragm which is stable. Right basilar atelectasis is present. There is no pneumothorax. Cervical hardware consistent with anterior fusion. IMPRESSION: Interval placement of right-sided PICC line. Cardiomegaly. Electronically Signed   By: Norva Pavlov M.D.   On: 11/05/2015 12:01   Dg Chest Port 1 View  10/30/2015  CLINICAL DATA:  Generalized weakness and elevated blood glucose today. EXAM: PORTABLE CHEST 1 VIEW COMPARISON:  CT chest 04/27/2015. PA and lateral chest 04/27/2015 and 01/22/2015. FINDINGS: Heart size is normal. The lungs are clear. No pneumothorax or pleural effusion. IMPRESSION: No acute disease. Electronically Signed   By: Drusilla Kanner M.D.   On: 10/30/2015 15:14   Dg Toe Great Right  10/30/2015  CLINICAL DATA:  Right toe swelling for 2 weeks.  Diabetic patient. EXAM: RIGHT GREAT TOE COMPARISON:  None. FINDINGS: There is no evidence of fracture or dislocation. There is no evidence of cortical erosions to suggest osteomyelitis radiographically. Heterogeneous appearance of the bony matrix of the first phalanx is noted, with uncertain significance. There is soft tissue swelling of the dorsal and palmar aspect of the mid and forefoot. IMPRESSION: No evidence of fracture or subluxation. No definitive radiographic evidence of osteomyelitis. Heterogeneous appearance of the bony matrix of the first digit with uncertain significance. Electronically Signed   By: Ted Mcalpine M.D.   On: 10/30/2015 13:51     CBC  Recent Labs Lab  11/01/15 0247 11/06/15 0420  WBC 10.2 8.6  HGB 12.1* 11.5*  HCT 36.1* 34.5*  PLT 205 249  MCV 89.0 87.8  MCH 29.8 29.3  MCHC 33.5 33.4  RDW 14.5 14.5    Chemistries   Recent Labs Lab 11/01/15 0247 11/02/15 0549 11/06/15 0420 11/06/15 1224 11/07/15 0516  NA 137  --  140  --  140  K 3.5  --  2.7* 3.8 3.7  CL 99*  --  101  --  103  CO2 26  --  31  --  30  GLUCOSE 187*  --  153*  --  141*  BUN 41*  --  23*  --  18  CREATININE 1.34* 1.21 0.79  --  0.75  CALCIUM 8.0*  --  8.2*  --  8.2*  MG  --   --   --  1.7  --    ------------------------------------------------------------------------------------------------------------------ estimated creatinine clearance is 91.4 mL/min (by C-G formula based on Cr of 0.75). ------------------------------------------------------------------------------------------------------------------ No results for input(s): HGBA1C in the last 72 hours. ------------------------------------------------------------------------------------------------------------------ No results for input(s): CHOL, HDL, LDLCALC, TRIG, CHOLHDL, LDLDIRECT in the last 72 hours. ------------------------------------------------------------------------------------------------------------------ No results for input(s): TSH, T4TOTAL, T3FREE, THYROIDAB in the last 72 hours.  Invalid input(s): FREET3 ------------------------------------------------------------------------------------------------------------------ No results for input(s): VITAMINB12, FOLATE, FERRITIN, TIBC, IRON, RETICCTPCT in the last 72 hours.  Coagulation profile No results for input(s): INR, PROTIME in the last 168 hours.  No results for input(s): DDIMER in the last 72 hours.  Cardiac Enzymes No results for input(s): CKMB, TROPONINI, MYOGLOBIN in the last 168 hours.  Invalid input(s):  CK ------------------------------------------------------------------------------------------------------------------ Invalid input(s): POCBNP    Assessment &  Plan   # PAD Angiogram on 11/04/2015.   Angioplasty done by vascular.  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 On IV unasyn now.  Podiatry consulted vascular for PAD ID consult appreciated. Vascular did Angiogram and I& D of right toe on 11/04/15 He may need long course of IV abx - as had osteomyelitis and bacteremia.  Wound culture sent from i& D.  Podiatry did amputation of first toe 11/07/15- now he may not need long course of ABx.   Will wait till surgical clearance.  # Elevated troponin likely from CHF and Infection. No further workup advised by cardiology. ASA, Statin, BB.  # Acute hypoxic respiratory failure secondary to acute on chronic systolic chf - EF 20-25% Changed lasix to PO- stable respi status. Monitor daily weights and intake and output Continue aspirin, beta blocker and statin No acute findings.  # hypokalemia   Replace oral, check Mg.  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 Shaune Pollack has discussed with on-call ophthalmologist Dr. Duke Salvia, who has recommended outpatient follow-up after discharge  # Diabetes mellitus Sliding scale insulin blood sugars elevated. On glipizide. Added Levemir.   Held metformin due to need for angiogram.  blood sugar under control.   Now resume metformin.     Code Status Orders        Start     Ordered   10/30/15 2249  Full code   Continuous     10/30/15 2248    Advance Directive Documentation        Most Recent Value   Type of Advance Directive  Healthcare Power of Attorney   Pre-existing out of facility DNR order (yellow form or pink MOST form)     "MOST" Form in Place?        Consults  Cardiology Likely d/c tomorrow- if clear from  surgical team.  DVT Prophylaxis  heparin  Lab Results  Component Value Date   PLT 249 11/06/2015     Time Spent in minutes   35 minutes  Altamese Dilling M.D on 11/07/2015 at 3:19 PM  Between 7am to 6pm - Pager - [phone]  After 6pm go to [email or web address] - password [email or web address] [facility]  [facility]   Office  [phone]

## 2015-11-07 NOTE — Progress Notes (Signed)
PT Cancellation Note  Patient Details Name: Daniel [name] MRN: [id number] DOB: 07/20/1951   Cancelled Treatment:    Reason Eval/Treat Not Completed: Patient at procedure or test/unavailable (Treatment re-attempted.  Patient currently off unit for R great toe/metatarsal amputation.  Will require new orders post-procedure.  Please re-consult as medically appropriate.)  Kellyn Mansfield H. Manson PasseyBrown, PT, DPT, NCS 11/07/2015, 8:24 AM [phone]

## 2015-11-08 ENCOUNTER — Encounter: Payer: Self-pay | Admitting: Vascular Surgery

## 2015-11-08 LAB — CBC WITH DIFFERENTIAL/PLATELET
Basophils Absolute: 0.2 10*3/uL — ABNORMAL HIGH (ref 0–0.1)
Basophils Relative: 1 %
EOS ABS: 0.1 10*3/uL (ref 0–0.7)
Eosinophils Relative: 1 %
HCT: 35.4 % — ABNORMAL LOW (ref 40.0–52.0)
HEMOGLOBIN: 11.6 g/dL — AB (ref 13.0–18.0)
LYMPHS ABS: 1.8 10*3/uL (ref 1.0–3.6)
LYMPHS PCT: 15 %
MCH: 28.7 pg (ref 26.0–34.0)
MCHC: 32.7 g/dL (ref 32.0–36.0)
MCV: 87.6 fL (ref 80.0–100.0)
MONOS PCT: 8 %
Monocytes Absolute: 0.9 10*3/uL (ref 0.2–1.0)
NEUTROS PCT: 75 %
Neutro Abs: 8.8 10*3/uL — ABNORMAL HIGH (ref 1.4–6.5)
Platelets: 297 10*3/uL (ref 150–440)
RBC: 4.04 MIL/uL — ABNORMAL LOW (ref 4.40–5.90)
RDW: 14.8 % — ABNORMAL HIGH (ref 11.5–14.5)
WBC: 11.8 10*3/uL — ABNORMAL HIGH (ref 3.8–10.6)

## 2015-11-08 LAB — GLUCOSE, CAPILLARY
GLUCOSE-CAPILLARY: 83 mg/dL (ref 65–99)
GLUCOSE-CAPILLARY: 69 mg/dL (ref 65–99)
GLUCOSE-CAPILLARY: 84 mg/dL (ref 65–99)
Glucose-Capillary: 130 mg/dL — ABNORMAL HIGH (ref 65–99)
Glucose-Capillary: 62 mg/dL — ABNORMAL LOW (ref 65–99)
Glucose-Capillary: 204 mg/dL — ABNORMAL HIGH (ref 65–99)

## 2015-11-08 LAB — ANAEROBIC CULTURE

## 2015-11-08 MED ORDER — SODIUM CHLORIDE 0.9 % IV SOLN
Freq: Four times a day (QID) | INTRAVENOUS | Status: DC
  Administered 2015-11-08: 12:00:00 via INTRAVENOUS
  Filled 2015-11-08 (×3): qty 100

## 2015-11-08 MED ORDER — CIPROFLOXACIN HCL 500 MG PO TABS
500.0000 mg | ORAL_TABLET | Freq: Two times a day (BID) | ORAL | Status: DC
  Administered 2015-11-08 – 2015-11-14 (×11): 500 mg via ORAL
  Filled 2015-11-08 (×11): qty 1

## 2015-11-08 MED ORDER — OXYCODONE HCL ER 10 MG PO T12A
10.0000 mg | EXTENDED_RELEASE_TABLET | Freq: Two times a day (BID) | ORAL | Status: DC
  Administered 2015-11-08 – 2015-11-14 (×12): 10 mg via ORAL
  Filled 2015-11-08 (×12): qty 1

## 2015-11-08 MED ORDER — OXYCODONE HCL 5 MG PO TABS
5.0000 mg | ORAL_TABLET | ORAL | Status: DC | PRN
  Administered 2015-11-09 – 2015-11-14 (×11): 5 mg via ORAL
  Filled 2015-11-08 (×11): qty 1

## 2015-11-08 MED ORDER — GABAPENTIN 300 MG PO CAPS: 600.0000 mg | ORAL_CAPSULE | Freq: Three times a day (TID) | ORAL | Status: DC

## 2015-11-08 MED ORDER — LACTULOSE 10 GM/15ML PO SOLN
20.0000 g | Freq: Once | ORAL | Status: AC
  Administered 2015-11-08: 20 g via ORAL
  Filled 2015-11-08: qty 30

## 2015-11-08 MED ORDER — CEFAZOLIN SODIUM-DEXTROSE 2-3 GM-% IV SOLR
2.0000 g | Freq: Three times a day (TID) | INTRAVENOUS | Status: DC
  Administered 2015-11-08 – 2015-11-14 (×19): 2 g via INTRAVENOUS
  Filled 2015-11-08 (×22): qty 50

## 2015-11-08 MED ORDER — MORPHINE SULFATE (PF) 2 MG/ML IV SOLN
2.0000 mg | INTRAVENOUS | Status: DC | PRN
  Administered 2015-11-11 – 2015-11-12 (×3): 2 mg via INTRAVENOUS
  Filled 2015-11-08 (×3): qty 1

## 2015-11-08 NOTE — Progress Notes (Addendum)
Per MD patient is not medically stable for D/C today. Per RN in progression rounds patient has a PICC and will be on IV Abx. [facility]. [facility] is aware of above. CSW will continue to follow and assist as needed.   Jetta LoutBailey Morgan, LCSWA [phone]

## 2015-11-08 NOTE — Progress Notes (Signed)
Infectious Disease Long Term IV Antibiotic Orders  Diagnosis: MSSA bacteremia, DM foot infection, PVD  Culture results MSSA, Providencia  Allergies:  Allergies  Allergen Reactions  . Dilaudid [Hydromorphone] Other (See Comments)    Agitation    Discharge antibiotics Cefazolin    2 grams every 8 hours Ciprofloxacin 500 mg po bid   PICC Care per protocol - FAX weekly labs to [phone] Labs weekly while on IV antibiotics      CBC w diff   Comprehensive met panel CRP   Planned duration of antibiotics 2 weeks from 1/3  Stop date  11/22/15  Follow up clinic date TBD    Leonel Ramsay, MD

## 2015-11-08 NOTE — Progress Notes (Signed)
Nutrition Follow-up     INTERVENTION:   Education: re-attempted verbal education on diabetic diet on visit today; again pt very groggy, falling asleep during education. Pt not appropriate for diet education at this time due to mental status, discharge plan. Noted plan for discharge to SNF, pt will be discharged on diabetic diet; recommend outpatient follow-up   NUTRITION DIAGNOSIS:   Food and nutrition related knowledge deficit related to limited prior education as evidenced by  (consult for diet education).  GOAL:   Patient will meet greater than or equal to 90% of their needs  MONITOR:    (Energy intake, Anthropometrics, Glucose Profile, Electrolyte/Reanl Profile)  REASON FOR ASSESSMENT:   Consult Diet education  ASSESSMENT:    Pt s/p amputation of right great toe and first ray resection, pt with MSSA bactermia, receiving IV antibiotics  Diet Order:  Diet Carb Modified Fluid consistency:: Thin; Room service appropriate?: Yes   Energy Intake: recorded po intake 75-100% of meals  Electrolyte and Renal Profile:  Recent Labs Lab 11/02/15 0549 11/06/15 0420 11/06/15 1224 11/07/15 0516  BUN  --  23*  --  18  CREATININE 1.21 0.79  --  0.75  NA  --  140  --  140  K  --  2.7* 3.8 3.7  MG  --   --  1.7  --    Glucose Profile:  Recent Labs  11/07/15 2127 11/08/15 0750 11/08/15 1124  GLUCAP 181* 83 130*   Meds: ss novolog, levemir, lactulose, aspart, glucophage  Height:   Ht Readings from Last 1 Encounters:  10/30/15 5\' 8"  (1.727 m)    Weight:   Wt Readings from Last 1 Encounters:  11/08/15 178 lb 6.4 oz (80.922 kg)    Filed Weights   11/06/15 0557 11/07/15 0418 11/08/15 0532  Weight: 172 lb 14.4 oz (78.427 kg) 171 lb 12.5 oz (77.92 kg) 178 lb 6.4 oz (80.922 kg)  '  BMI:  Body mass index is 27.13 kg/(m^2).  LOW Care Level  Romelle StarcherCate Rickey Farrier MS, IowaRD, LDN [phone] Pager  [phone] Weekend/On-Call Pager

## 2015-11-08 NOTE — Progress Notes (Signed)
1 Day Post-Op  Subjective: Patient seen. Some throbbing yesterday but states today it feels some better.  Objective: Vital signs in last 24 hours: Temp:  [97.5 F (36.4 C)-99 F (37.2 C)] 98.5 F (36.9 C) (01/03 0748) Pulse Rate:  [68-89] 73 (01/03 0748) Resp:  [12-20] 16 (01/03 0748) BP: (137-190)/(74-106) 138/76 mmHg (01/03 0748) SpO2:  [94 %-100 %] 96 % (01/03 0748) Weight:  [80.922 kg (178 lb 6.4 oz)] 80.922 kg (178 lb 6.4 oz) (01/03 0532) Last BM Date: 10/29/16  Intake/Output from previous day: 01/02 0701 - 01/03 0700 In: 1363 [P.O.:560; I.V.:403; IV Piggyback:400] Out: 2750 [Urine:2750] Intake/Output this shift:    The bandages dry and intact on the right foot. No evidence of any bleeding through the bandage  Lab Results:   Recent Labs  11/06/15 0420 11/08/15 0425  WBC 8.6 11.8*  HGB 11.5* 11.6*  HCT 34.5* 35.4*  PLT 249 297   BMET  Recent Labs  11/06/15 0420 11/06/15 1224 11/07/15 0516  NA 140  --  140  K 2.7* 3.8 3.7  CL 101  --  103  CO2 31  --  30  GLUCOSE 153*  --  141*  BUN 23*  --  18  CREATININE 0.79  --  0.75  CALCIUM 8.2*  --  8.2*   PT/INR No results for input(s): LABPROT, INR in the last 72 hours. ABG No results for input(s): PHART, HCO3 in the last 72 hours.  Invalid input(s): PCO2, PO2  Studies/Results: No results found.  Anti-infectives: Anti-infectives    Start     Dose/Rate Route Frequency Ordered Stop   11/02/15 1800  Ampicillin-Sulbactam (UNASYN) 3 g in sodium chloride 0.9 % 100 mL IVPB     3 g 100 mL/hr over 60 Minutes Intravenous Every 6 hours 11/02/15 1522     11/02/15 1400  piperacillin-tazobactam (ZOSYN) IVPB 3.375 g  Status:  Discontinued     3.375 g 12.5 mL/hr over 240 Minutes Intravenous 3 times per day 11/02/15 1350 11/02/15 1444   10/31/15 1130  Ampicillin-Sulbactam (UNASYN) 3 g in sodium chloride 0.9 % 100 mL IVPB  Status:  Discontinued     3 g 100 mL/hr over 60 Minutes Intravenous Every 6 hours 10/31/15  1035 11/02/15 1334   10/31/15 0500  vancomycin (VANCOCIN) IVPB 1000 mg/200 mL premix  Status:  Discontinued     1,000 mg 200 mL/hr over 60 Minutes Intravenous Every 12 hours 10/30/15 2248 11/02/15 1333   10/30/15 2130  vancomycin (VANCOCIN) IVPB 1000 mg/200 mL premix  Status:  Discontinued     1,000 mg 200 mL/hr over 60 Minutes Intravenous  Once 10/30/15 2120 10/30/15 2141   10/30/15 1645  piperacillin-tazobactam (ZOSYN) IVPB 3.375 g     3.375 g 100 mL/hr over 30 Minutes Intravenous  Once 10/30/15 1633 10/30/15 2019   10/30/15 1645  vancomycin (VANCOCIN) IVPB 1000 mg/200 mL premix     1,000 mg 200 mL/hr over 60 Minutes Intravenous  Once 10/30/15 1633 10/30/15 2019      Assessment/Plan: s/p Procedure(s): AMPUTATION TOE and 1st ray amputation (Right) Assessment: 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 We will plan for dressing change and evaluation of the wound tomorrow  LOS: 9 days    Daniel Attar W. 11/08/2015

## 2015-11-08 NOTE — Care Management (Signed)
Anticipate discharge to skilled nursing 1/4.

## 2015-11-08 NOTE — Progress Notes (Signed)
Per MD Elisabeth PigeonVachhani hold glipizide in setting of hypoglycemia, ok to give metformin, he will DC 5 units novolog AC

## 2015-11-08 NOTE — Progress Notes (Signed)
PT Cancellation Note  Patient Details Name: Daniel [name] MRN: [id number] DOB: 09/17/1951   Cancelled Treatment:    Reason Eval/Treat Not Completed: Other (comment). New order received and pt is not s/p 1st toe amputation. Need new WB order from podiatry prior to initiation of therapy evaluation. Hold this date, until cleared for mobility.   Addysin Porco 11/08/2015, 1:18 PM Elizabeth PalauStephanie Ahni Bradwell, PT, DPT [phone]

## 2015-11-08 NOTE — Progress Notes (Signed)
[facility] INFECTIOUS DISEASE PROGRESS NOTE Date of Admission:  10/30/2015     ID: Daniel [name] is a 64 y.o. male with MSSA bacteremia, viridan strep bacteremia (possible contaminant), wound infection.    Active Problems:   Unstable angina (HCC)  Subjective: Underwent amputation 1/2 with Amputation right great toe with partial first ray resection.  ROS  Eleven systems are reviewed and negative except per hpi  Medications:  Antibiotics Given (last 72 hours)    Date/Time Action Medication Dose Rate   11/05/15 1702 Given   Ampicillin-Sulbactam (UNASYN) 3 g in sodium chloride 0.9 % 100 mL IVPB 3 g 100 mL/hr   11/06/15 0000 Given   Ampicillin-Sulbactam (UNASYN) 3 g in sodium chloride 0.9 % 100 mL IVPB 3 g 100 mL/hr   11/06/15 0615 Given   Ampicillin-Sulbactam (UNASYN) 3 g in sodium chloride 0.9 % 100 mL IVPB 3 g 100 mL/hr   11/06/15 1142 Given   Ampicillin-Sulbactam (UNASYN) 3 g in sodium chloride 0.9 % 100 mL IVPB 3 g 100 mL/hr   11/06/15 1706 Given   Ampicillin-Sulbactam (UNASYN) 3 g in sodium chloride 0.9 % 100 mL IVPB 3 g 100 mL/hr   11/07/15 0000 Given   Ampicillin-Sulbactam (UNASYN) 3 g in sodium chloride 0.9 % 100 mL IVPB 3 g 100 mL/hr   11/07/15 0536 Given   Ampicillin-Sulbactam (UNASYN) 3 g in sodium chloride 0.9 % 100 mL IVPB 3 g 100 mL/hr   11/07/15 1201 Given   Ampicillin-Sulbactam (UNASYN) 3 g in sodium chloride 0.9 % 100 mL IVPB 3 g 100 mL/hr   11/07/15 1719 Given   Ampicillin-Sulbactam (UNASYN) 3 g in sodium chloride 0.9 % 100 mL IVPB 3 g 100 mL/hr   11/08/15 0034 Given   Ampicillin-Sulbactam (UNASYN) 3 g in sodium chloride 0.9 % 100 mL IVPB 3 g 100 mL/hr   11/08/15 0544 Given   Ampicillin-Sulbactam (UNASYN) 3 g in sodium chloride 0.9 % 100 mL IVPB 3 g 100 mL/hr     . amLODipine  5 mg Oral Daily  . aspirin EC  81 mg Oral Daily  . atorvastatin  40 mg Oral QHS  . docusate sodium  100 mg Oral BID  . enoxaparin (LOVENOX) injection  40 mg Subcutaneous Q24H  .  furosemide  40 mg Oral BID  . glipiZIDE  10 mg Oral BID AC  . insulin aspart  0-9 Units Subcutaneous TID AC & HS  . insulin aspart  5 Units Subcutaneous TID WC  . insulin detemir  5 Units Subcutaneous QHS  . losartan  100 mg Oral Daily  . metFORMIN  500 mg Oral BID WC  . metoprolol tartrate  25 mg Oral BID  . pantoprazole  40 mg Oral QAC breakfast  . polyethylene glycol  17 g Oral Daily  . potassium chloride  40 mEq Oral BID  . pregabalin  75 mg Oral BID  . sodium chloride 0.9 % 100 mL with ampicillin-sulbactam (UNASYN) 3 g infusion   Intravenous 4 times per day  . sodium chloride  3 mL Intravenous Q12H    Objective: Vital signs in last 24 hours: Temp:  [97.5 F (36.4 C)-98.5 F (36.9 C)] 97.8 F (36.6 C) (01/03 1124) Pulse Rate:  [63-89] 63 (01/03 1124) Resp:  [16-20] 19 (01/03 1124) BP: (106-190)/(64-88) 106/64 mmHg (01/03 1124) SpO2:  [96 %-98 %] 97 % (01/03 1124) Weight:  [80.922 kg (178 lb 6.4 oz)] 80.922 kg (178 lb 6.4 oz) (01/03 0532) Constitutional: He is oriented to   person, place, and time. dishelved HENT: PERRLA, EOMI, anicteric Mouth/Throat: Oropharynx is clear and moist. No oropharyngeal exudate.  Cardiovascular: Normal rate, regular rhythm and normal heart sounds.  Pulmonary/Chest: Effort normal and breath sounds normal. No respiratory distress. He has no wheezes.  Abdominal: Soft. Bowel sounds are normal. He exhibits no distension. There is no tenderness.  Lymphadenopathy: He has no cervical adenopathy.  Neurological: He is alert and oriented to person, place, and time.  Skin: R foot wrapped post op Psychiatric: He has a normal mood and affect. His behavior is normal.   Lab Results  Recent Labs  11/06/15 0420 11/06/15 1224 11/07/15 0516 11/08/15 0425  WBC 8.6  --   --  11.8*  HGB 11.5*  --   --  11.6*  HCT 34.5*  --   --  35.4*  NA 140  --  140  --   K 2.7* 3.8 3.7  --   CL 101  --  103  --   CO2 31  --  30  --   BUN 23*  --  18  --   CREATININE  0.79  --  0.75  --     Microbiology: Results for orders placed or performed during the hospital encounter of 10/30/15  Culture, blood (routine x 2)     Status: None   Collection Time: 10/30/15  4:44 PM  Result Value Ref Range Status   Specimen Description BLOOD LEFT WRIST  Final   Special Requests BOTTLES DRAWN AEROBIC AND ANAEROBIC  2CC  Final   Culture  Setup Time   Final    GRAM POSITIVE COCCI IN CLUSTERS AEROBIC BOTTLE ONLY CRITICAL RESULT CALLED TO, READ BACK BY AND VERIFIED WITH: JASON ROBBINS AT 7253 ON 10/31/15 CTJ    Culture STAPHYLOCOCCUS AUREUS AEROBIC BOTTLE ONLY   Final   Report Status 11/04/2015 FINAL  Final   Organism ID, Bacteria STAPHYLOCOCCUS AUREUS  Final      Susceptibility   Staphylococcus aureus - MIC*    CIPROFLOXACIN <=0.5 SENSITIVE Sensitive     ERYTHROMYCIN 0.5 SENSITIVE Sensitive     GENTAMICIN <=0.5 SENSITIVE Sensitive     OXACILLIN <=0.25 SENSITIVE Sensitive     TRIMETH/SULFA <=10 SENSITIVE Sensitive     CLINDAMYCIN <=0.25 SENSITIVE Sensitive     CEFOXITIN SCREEN NEGATIVE Sensitive     Inducible Clindamycin NEGATIVE Sensitive     TETRACYCLINE Value in next row Sensitive      SENSITIVE<=1    * STAPHYLOCOCCUS AUREUS  Blood Culture ID Panel (Reflexed)     Status: Abnormal   Collection Time: 10/30/15  4:44 PM  Result Value Ref Range Status   Enterococcus species NOT DETECTED NOT DETECTED Final   Listeria monocytogenes NOT DETECTED NOT DETECTED Final   Staphylococcus species DETECTED (A) NOT DETECTED Corrected    Comment: CORRECTED ON 12/27 AT 0801: PREVIOUSLY REPORTED AS NOT DETECTED   Staphylococcus aureus DETECTED (A) NOT DETECTED Final    Comment: CRITICAL RESULT CALLED TO, READ BACK BY AND VERIFIED WITH: JASON ROBBINS AT 1710 ON 10/31/15 CTJ    Streptococcus species NOT DETECTED NOT DETECTED Final   Streptococcus agalactiae NOT DETECTED NOT DETECTED Final   Streptococcus pneumoniae NOT DETECTED NOT DETECTED Final   Streptococcus pyogenes NOT  DETECTED NOT DETECTED Final   Acinetobacter baumannii NOT DETECTED NOT DETECTED Final   Enterobacteriaceae species NOT DETECTED NOT DETECTED Final   Enterobacter cloacae complex NOT DETECTED NOT DETECTED Final   Escherichia coli NOT DETECTED NOT DETECTED Final  Klebsiella oxytoca NOT DETECTED NOT DETECTED Final   Klebsiella pneumoniae NOT DETECTED NOT DETECTED Final   Proteus species NOT DETECTED NOT DETECTED Final   Serratia marcescens NOT DETECTED NOT DETECTED Final   Haemophilus influenzae NOT DETECTED NOT DETECTED Final   Neisseria meningitidis NOT DETECTED NOT DETECTED Final   Pseudomonas aeruginosa NOT DETECTED NOT DETECTED Final   Candida albicans NOT DETECTED NOT DETECTED Final   Candida glabrata NOT DETECTED NOT DETECTED Final   Candida krusei NOT DETECTED NOT DETECTED Final   Candida parapsilosis NOT DETECTED NOT DETECTED Final   Candida tropicalis NOT DETECTED NOT DETECTED Final   Carbapenem resistance NOT DETECTED NOT DETECTED Final   Methicillin resistance NOT DETECTED NOT DETECTED Final   Vancomycin resistance NOT DETECTED NOT DETECTED Final  Culture, blood (routine x 2)     Status: None   Collection Time: 10/30/15  5:31 PM  Result Value Ref Range Status   Specimen Description BLOOD LEFT HAND  Final   Special Requests BOTTLES DRAWN AEROBIC AND ANAEROBIC  2CC  Final   Culture  Setup Time   Final    GRAM POSITIVE COCCI IN CHAINS IN BOTH AEROBIC AND ANAEROBIC BOTTLES CRITICAL RESULT CALLED TO, READ BACK BY AND VERIFIED WITH: KAREN HAYES AT 1045 10/31/15 CTJ    Culture   Final    VIRIDANS STREPTOCOCCUS IN BOTH AEROBIC AND ANAEROBIC BOTTLES MULTIPLE SPECIES PRESENT Results consistent with contamination.    Report Status 11/02/2015 FINAL  Final  Blood Culture ID Panel (Reflexed)     Status: Abnormal   Collection Time: 10/30/15  5:31 PM  Result Value Ref Range Status   Enterococcus species NOT DETECTED NOT DETECTED Final   Listeria monocytogenes NOT DETECTED NOT  DETECTED Final   Staphylococcus species NOT DETECTED NOT DETECTED Final   Staphylococcus aureus NOT DETECTED NOT DETECTED Final   Streptococcus species DETECTED (A) NOT DETECTED Final    Comment: CRITICAL RESULT CALLED TO, READ BACK BY AND VERIFIED WITH: KAREN HAYES AT 1045 ON 10/31/15 CTJ    Streptococcus agalactiae NOT DETECTED NOT DETECTED Final   Streptococcus pneumoniae NOT DETECTED NOT DETECTED Final   Streptococcus pyogenes NOT DETECTED NOT DETECTED Final   Acinetobacter baumannii NOT DETECTED NOT DETECTED Final   Enterobacteriaceae species NOT DETECTED NOT DETECTED Final   Enterobacter cloacae complex NOT DETECTED NOT DETECTED Final   Escherichia coli NOT DETECTED NOT DETECTED Final   Klebsiella oxytoca NOT DETECTED NOT DETECTED Final   Klebsiella pneumoniae NOT DETECTED NOT DETECTED Final   Proteus species NOT DETECTED NOT DETECTED Final   Serratia marcescens NOT DETECTED NOT DETECTED Final   Haemophilus influenzae NOT DETECTED NOT DETECTED Final   Neisseria meningitidis NOT DETECTED NOT DETECTED Final   Pseudomonas aeruginosa NOT DETECTED NOT DETECTED Final   Candida albicans NOT DETECTED NOT DETECTED Final   Candida glabrata NOT DETECTED NOT DETECTED Final   Candida krusei NOT DETECTED NOT DETECTED Final   Candida parapsilosis NOT DETECTED NOT DETECTED Final   Candida tropicalis NOT DETECTED NOT DETECTED Final   Carbapenem resistance NOT DETECTED NOT DETECTED Final   Methicillin resistance NOT DETECTED NOT DETECTED Final   Vancomycin resistance NOT DETECTED NOT DETECTED Final  CULTURE, BLOOD (ROUTINE X 2) w Reflex to PCR ID Panel     Status: None   Collection Time: 10/31/15 11:35 AM  Result Value Ref Range Status   Specimen Description BLOOD RIGHT HAND  Final   Special Requests BOTTLES DRAWN AEROBIC AND ANAEROBIC 2CC  Final     Culture NO GROWTH 5 DAYS  Final   Report Status 11/05/2015 FINAL  Final  CULTURE, BLOOD (ROUTINE X 2) w Reflex to PCR ID Panel     Status: None    Collection Time: 10/31/15 11:35 AM  Result Value Ref Range Status   Specimen Description BLOOD RIGHT ASSIST CONTROL  Final   Special Requests BOTTLES DRAWN AEROBIC AND ANAEROBIC 2CC  Final   Culture NO GROWTH 5 DAYS  Final   Report Status 11/05/2015 FINAL  Final  Wound culture     Status: None   Collection Time: 10/31/15  2:12 PM  Result Value Ref Range Status   Specimen Description ULCER  Final   Special Requests NONE  Final   Gram Stain   Final    RARE WBC SEEN MODERATE GRAM POSITIVE COCCI IN PAIRS IN CLUSTERS RARE GRAM NEGATIVE RODS RARE GRAM POSITIVE RODS    Culture   Final    MODERATE GROWTH PROVIDENCIA RETTGERI HEAVY GROWTH STAPHYLOCOCCUS AUREUS    Report Status 11/03/2015 FINAL  Final   Organism ID, Bacteria PROVIDENCIA RETTGERI  Final   Organism ID, Bacteria STAPHYLOCOCCUS AUREUS  Final      Susceptibility   Staphylococcus aureus - MIC*    CIPROFLOXACIN <=0.5 SENSITIVE Sensitive     ERYTHROMYCIN <=0.25 SENSITIVE Sensitive     GENTAMICIN <=0.5 SENSITIVE Sensitive     OXACILLIN <=0.25 SENSITIVE Sensitive     TRIMETH/SULFA <=10 SENSITIVE Sensitive     CLINDAMYCIN <=0.25 SENSITIVE Sensitive     CEFOXITIN SCREEN NEGATIVE Sensitive     Inducible Clindamycin NEGATIVE Sensitive     TETRACYCLINE Value in next row Sensitive      SENSITIVE<=1    * HEAVY GROWTH STAPHYLOCOCCUS AUREUS   Providencia rettgeri - MIC*    AMPICILLIN Value in next row Resistant      SENSITIVE<=1    CEFTAZIDIME Value in next row Sensitive      SENSITIVE<=1    CEFAZOLIN Value in next row Resistant      SENSITIVE<=1    CEFTRIAXONE Value in next row Sensitive      SENSITIVE<=1    CIPROFLOXACIN Value in next row Sensitive      SENSITIVE<=1    GENTAMICIN Value in next row Sensitive      SENSITIVE<=1    IMIPENEM Value in next row Sensitive      SENSITIVE<=1    TRIMETH/SULFA Value in next row Resistant      SENSITIVE<=1    PIP/TAZO Value in next row Sensitive      SENSITIVE<=4     AMPICILLIN/SULBACTAM Value in next row Sensitive      SENSITIVE<=2    * MODERATE GROWTH PROVIDENCIA RETTGERI  Culture, blood (Routine X 2) w Reflex to ID Panel     Status: None   Collection Time: 11/01/15  5:07 PM  Result Value Ref Range Status   Specimen Description BLOOD RIGHT ASSIST CONTROL  Final   Special Requests BOTTLES DRAWN AEROBIC AND ANAEROBIC 5CC  Final   Culture NO GROWTH 5 DAYS  Final   Report Status 11/06/2015 FINAL  Final  Culture, blood (Routine X 2) w Reflex to ID Panel     Status: None   Collection Time: 11/01/15  5:07 PM  Result Value Ref Range Status   Specimen Description BLOOD RIGHT HAND  Final   Special Requests BOTTLES DRAWN AEROBIC AND ANAEROBIC 3CC  Final   Culture NO GROWTH 5 DAYS  Final   Report Status 11/06/2015 FINAL  Final  Anaerobic culture     Status: None   Collection Time: 11/04/15  7:20 PM  Result Value Ref Range Status   Specimen Description TOE RIGHT GREAT TOE  Final   Special Requests NONE  Final   Culture NO ANAEROBES ISOLATED  Final   Report Status 11/08/2015 FINAL  Final  Wound culture     Status: None   Collection Time: 11/04/15  7:20 PM  Result Value Ref Range Status   Specimen Description WOUND RIGHT GREAT TOE  Final   Special Requests NONE  Final   Gram Stain RARE WBC SEEN NO ORGANISMS SEEN   Final   Culture NO GROWTH 3 DAYS  Final   Report Status 11/07/2015 FINAL  Final  Wound culture     Status: None (Preliminary result)   Collection Time: 11/07/15  8:46 AM  Result Value Ref Range Status   Specimen Description WOUND  Final   Special Requests NONE  Final   Gram Stain   Final    RARE WBC SEEN FEW RED BLOOD CELLS RARE GRAM NEGATIVE RODS    Culture   Final    MODERATE GROWTH GRAM NEGATIVE RODS IDENTIFICATION AND SUSCEPTIBILITIES TO FOLLOW ISOLATING ADDITIONAL POSSIBLE PATHOGEN    Report Status PENDING  Incomplete    Studies/Results: No results found. ECHO  - Left ventricle: The cavity size was severely dilated.  Systolic function was severely reduced. The estimated ejection fraction was in the range of 20% to 25%. Diffuse hypokinesis. - Aortic valve: Valve area (Vmax): 2.05 cm^2. - Mitral valve: There was mild regurgitation. - Left atrium: The atrium was mildly dilated. - Right atrium: The atrium was mildly dilated.  Assessment/Plan:  Daniel [name] is a 64 y.o. 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 Wound cx with heavy growth staph aureus and mod growth Providenceia. A1c 12.2. Vascular eval s/p angioplasty 12/30 . Denies smoking.  ESR 18, CRP 24.9. HIV NR.  FU BCX 12/27 NGTD.  Tissue cx 1/2 with GNR pendign Echo with EF 20-25%, no vegetations noted. PICC placed  Recommendations Will need at least 2 weeks IV abx for the S aureus bacteremia - Now that toe is amputated he will likely not need 6 week total IV abx for the osteomyelitis. He is being discharged to SNF -  Will change to ancef 2 gm q 8 IV and oral cipro (for the providencia) See abx order sheet  Thank you very much for the consult. Will follow with you.  FITZGERALD, DAVID   11/08/2015, 2:14 PM      

## 2015-11-08 NOTE — Progress Notes (Signed)
[facility] - [place] at [facility]                                                                                                                                                                                            Patient Demographics   Daniel [name], is a 65 y.o. male, DOB - 12/19/1950, ZOX:[id number]  Admit date - 10/30/2015   Admitting Physician Ramonita Lab, MD  Outpatient Primary MD for the patient is [name],[name], MD   LOS - 9  Subjective: Patient admitted with shortness of breath,  right toe erythema and pain, also left sided new eye visual difficulties. Continues to have pain right foot. SOB improved. No CP. Afebrile. Off Oxygen  Angio and I& D of right toe is done 11/04/15, less pain. 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   Pain is under control, NO BM for last 7 days.  Review of Systems:   CONSTITUTIONAL: No documented fever. No fatigue, weakness. No weight gain, no weight loss.  EYES: No blurry or double vision. Left eye visual difficulties ENT: No tinnitus. No postnasal drip. No redness of the oropharynx.  RESPIRATORY: No cough, no wheeze, no hemoptysis. Positive dyspnea.  CARDIOVASCULAR: No chest pain. No orthopnea. No palpitations. No syncope.  GASTROINTESTINAL: No nausea, no vomiting or diarrhea. No abdominal pain. No melena or hematochezia. Have constipation. GENITOURINARY: No dysuria or hematuria.  ENDOCRINE: No polyuria or nocturia. No heat or cold intolerance.  HEMATOLOGY: No anemia. No bruising. No bleeding.  INTEGUMENTARY: No rashes. No lesions.  MUSCULOSKELETAL: No arthritis. No swelling. No gout. Right foot pain. NEUROLOGIC: No numbness, tingling, or ataxia. No seizure-type activity. Generalized weakness PSYCHIATRIC: No anxiety. No insomnia. No ADD.     Vitals:   Filed Vitals:   11/07/15 2035 11/08/15 0532 11/08/15 0748 11/08/15 1124  BP: 155/85 137/74 138/76 106/64  Pulse: 81 68 73 63  Temp: 98.2 F (36.8  C) 97.5 F (36.4 C) 98.5 F (36.9 C) 97.8 F (36.6 C)  TempSrc: Oral Oral Oral   Resp: 20 18 16 19   Height:      Weight:  80.922 kg (178 lb 6.4 oz)    SpO2: 96% 98% 96% 97%    Wt Readings from Last 3 Encounters:  11/08/15 80.922 kg (178 lb 6.4 oz)  04/27/15 74.844 kg (165 lb)     Intake/Output Summary (Last 24 hours) at 11/08/15 1658 Last data filed at 11/08/15 1538  Gross per 24 hour  Intake    860 ml  Output   1500 ml  Net   -640 ml    Physical Exam:  GENERAL: Pleasant-appearing in no apparent distress.  HEAD, EYES, EARS, NOSE AND THROAT: Atraumatic, normocephalic. Extraocular muscles are intact. Pupils equal and reactive to light. Sclerae anicteric. No conjunctival injection. No oro-pharyngeal erythema.  NECK: Supple. There is no jugular venous distention. No bruits, no lymphadenopathy, no thyromegaly.  HEART: Regular rate and rhythm,. No murmurs, no rubs, no clicks.  LUNGS: Bilateral crackles at the bases ABDOMEN: Soft, flat, nontender, nondistended. Has good bowel sounds. No hepatosplenomegaly appreciated.  EXTREMITIES: right foot redenss, with cynosis on great toe, with tenderness and swelling on whole foot. NEUROLOGIC: The patient is alert, awake, and oriented x3 with no focal motor deficits appreciated bilaterally. Decreased sensations feet. SKIN: Right great toe erythematous surrounding cellulitis- dressing present on right foot. Psych: Not anxious, depressed LN: No inguinal LN enlargement. 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   Antibiotics   Anti-infectives    Start     Dose/Rate Route Frequency Ordered Stop   11/08/15 2000  ciprofloxacin (CIPRO) tablet 500 mg     500 mg Oral 2 times daily 11/08/15 1432     11/08/15 1445  ceFAZolin (ANCEF) IVPB 2 g/50 mL premix     2 g 100 mL/hr over 30 Minutes Intravenous 3 times per day 11/08/15 1432     11/08/15 1200  sodium chloride 0.9 % 100 mL with ampicillin-sulbactam (UNASYN) 3 g infusion  Status:   Discontinued     100 mL/hr  Intravenous 4 times per day 11/08/15 1000 11/08/15 1432   11/02/15 1800  Ampicillin-Sulbactam (UNASYN) 3 g in sodium chloride 0.9 % 100 mL IVPB  Status:  Discontinued     3 g 100 mL/hr over 60 Minutes Intravenous Every 6 hours 11/02/15 1522 11/08/15 1005   11/02/15 1400  piperacillin-tazobactam (ZOSYN) IVPB 3.375 g  Status:  Discontinued     3.375 g 12.5 mL/hr over 240 Minutes Intravenous 3 times per day 11/02/15 1350 11/02/15 1444   10/31/15 1130  Ampicillin-Sulbactam (UNASYN) 3 g in sodium chloride 0.9 % 100 mL IVPB  Status:  Discontinued     3 g 100 mL/hr over 60 Minutes Intravenous Every 6 hours 10/31/15 1035 11/02/15 1334   10/31/15 0500  vancomycin (VANCOCIN) IVPB 1000 mg/200 mL premix  Status:  Discontinued     1,000 mg 200 mL/hr over 60 Minutes Intravenous Every 12 hours 10/30/15 2248 11/02/15 1333   10/30/15 2130  vancomycin (VANCOCIN) IVPB 1000 mg/200 mL premix  Status:  Discontinued     1,000 mg 200 mL/hr over 60 Minutes Intravenous  Once 10/30/15 2120 10/30/15 2141   10/30/15 1645  piperacillin-tazobactam (ZOSYN) IVPB 3.375 g     3.375 g 100 mL/hr over 30 Minutes Intravenous  Once 10/30/15 1633 10/30/15 2019   10/30/15 1645  vancomycin (VANCOCIN) IVPB 1000 mg/200 mL premix     1,000 mg 200 mL/hr over 60 Minutes Intravenous  Once 10/30/15 1633 10/30/15 2019      Medications   Scheduled Meds: . amLODipine  5 mg Oral Daily  . aspirin EC  81 mg Oral Daily  . atorvastatin  40 mg Oral QHS  .  ceFAZolin (ANCEF) IV  2 g Intravenous 3 times per day  . ciprofloxacin  500 mg Oral BID  . docusate sodium  100 mg Oral BID  . enoxaparin (LOVENOX) injection  40 mg Subcutaneous Q24H  . furosemide  40 mg Oral BID  . glipiZIDE  10 mg Oral BID AC  . insulin aspart  0-9 Units Subcutaneous TID AC &  HS  . insulin detemir  5 Units Subcutaneous QHS  . losartan  100 mg Oral Daily  . metFORMIN  500 mg Oral BID WC  . metoprolol tartrate  25 mg Oral BID  .  oxyCODONE  10 mg Oral Q12H  . pantoprazole  40 mg Oral QAC breakfast  . polyethylene glycol  17 g Oral Daily  . potassium chloride  40 mEq Oral BID  . pregabalin  75 mg Oral BID  . sodium chloride  3 mL Intravenous Q12H   Continuous Infusions:   PRN Meds:.sodium chloride, acetaminophen, morphine injection, nitroGLYCERIN, ondansetron (ZOFRAN) IV, oxyCODONE, sodium chloride   Data Review:   Micro Results Recent Results (from the past 240 hour(s))  Culture, blood (routine x 2)     Status: None   Collection Time: 10/30/15  4:44 PM  Result Value Ref Range Status   Specimen Description BLOOD LEFT WRIST  Final   Special Requests BOTTLES DRAWN AEROBIC AND ANAEROBIC  2CC  Final   Culture  Setup Time   Final    GRAM POSITIVE COCCI IN CLUSTERS AEROBIC BOTTLE ONLY CRITICAL RESULT CALLED TO, READ BACK BY AND VERIFIED WITH: JASON ROBBINS AT 1710 ON 10/31/15 CTJ    Culture STAPHYLOCOCCUS AUREUS AEROBIC BOTTLE ONLY   Final   Report Status 11/04/2015 FINAL  Final   Organism ID, Bacteria STAPHYLOCOCCUS AUREUS  Final      Susceptibility   Staphylococcus aureus - MIC*    CIPROFLOXACIN <=0.5 SENSITIVE Sensitive     ERYTHROMYCIN 0.5 SENSITIVE Sensitive     GENTAMICIN <=0.5 SENSITIVE Sensitive     OXACILLIN <=0.25 SENSITIVE Sensitive     TRIMETH/SULFA <=10 SENSITIVE Sensitive     CLINDAMYCIN <=0.25 SENSITIVE Sensitive     CEFOXITIN SCREEN NEGATIVE Sensitive     Inducible Clindamycin NEGATIVE Sensitive     TETRACYCLINE Value in next row Sensitive      SENSITIVE<=1    * STAPHYLOCOCCUS AUREUS  Blood Culture ID Panel (Reflexed)     Status: Abnormal   Collection Time: 10/30/15  4:44 PM  Result Value Ref Range Status   Enterococcus species NOT DETECTED NOT DETECTED Final   Listeria monocytogenes NOT DETECTED NOT DETECTED Final   Staphylococcus species DETECTED (A) NOT DETECTED Corrected    Comment: CORRECTED ON 12/27 AT 0801: PREVIOUSLY REPORTED AS NOT DETECTED   Staphylococcus aureus DETECTED  (A) NOT DETECTED Final    Comment: CRITICAL RESULT CALLED TO, READ BACK BY AND VERIFIED WITH: JASON ROBBINS AT 1710 ON 10/31/15 CTJ    Streptococcus species NOT DETECTED NOT DETECTED Final   Streptococcus agalactiae NOT DETECTED NOT DETECTED Final   Streptococcus pneumoniae NOT DETECTED NOT DETECTED Final   Streptococcus pyogenes NOT DETECTED NOT DETECTED Final   Acinetobacter baumannii NOT DETECTED NOT DETECTED Final   Enterobacteriaceae species NOT DETECTED NOT DETECTED Final   Enterobacter cloacae complex NOT DETECTED NOT DETECTED Final   Escherichia coli NOT DETECTED NOT DETECTED Final   Klebsiella oxytoca NOT DETECTED NOT DETECTED Final   Klebsiella pneumoniae NOT DETECTED NOT DETECTED Final   Proteus species NOT DETECTED NOT DETECTED Final   Serratia marcescens NOT DETECTED NOT DETECTED Final   Haemophilus influenzae NOT DETECTED NOT DETECTED Final   Neisseria meningitidis NOT DETECTED NOT DETECTED Final   Pseudomonas aeruginosa NOT DETECTED NOT DETECTED Final   Candida albicans NOT DETECTED NOT DETECTED Final   Candida glabrata NOT DETECTED NOT DETECTED Final   Candida krusei NOT DETECTED NOT DETECTED Final   Candida parapsilosis  NOT DETECTED NOT DETECTED Final   Candida tropicalis NOT DETECTED NOT DETECTED Final   Carbapenem resistance NOT DETECTED NOT DETECTED Final   Methicillin resistance NOT DETECTED NOT DETECTED Final   Vancomycin resistance NOT DETECTED NOT DETECTED Final  Culture, blood (routine x 2)     Status: None   Collection Time: 10/30/15  5:31 PM  Result Value Ref Range Status   Specimen Description BLOOD LEFT HAND  Final   Special Requests BOTTLES DRAWN AEROBIC AND ANAEROBIC  2CC  Final   Culture  Setup Time   Final    GRAM POSITIVE COCCI IN CHAINS IN BOTH AEROBIC AND ANAEROBIC BOTTLES CRITICAL RESULT CALLED TO, READ BACK BY AND VERIFIED WITH: KAREN HAYES AT 1045 10/31/15 CTJ    Culture   Final    VIRIDANS STREPTOCOCCUS IN BOTH AEROBIC AND ANAEROBIC  BOTTLES MULTIPLE SPECIES PRESENT Results consistent with contamination.    Report Status 11/02/2015 FINAL  Final  Blood Culture ID Panel (Reflexed)     Status: Abnormal   Collection Time: 10/30/15  5:31 PM  Result Value Ref Range Status   Enterococcus species NOT DETECTED NOT DETECTED Final   Listeria monocytogenes NOT DETECTED NOT DETECTED Final   Staphylococcus species NOT DETECTED NOT DETECTED Final   Staphylococcus aureus NOT DETECTED NOT DETECTED Final   Streptococcus species DETECTED (A) NOT DETECTED Final    Comment: CRITICAL RESULT CALLED TO, READ BACK BY AND VERIFIED WITH: KAREN HAYES AT 1045 ON 10/31/15 CTJ    Streptococcus agalactiae NOT DETECTED NOT DETECTED Final   Streptococcus pneumoniae NOT DETECTED NOT DETECTED Final   Streptococcus pyogenes NOT DETECTED NOT DETECTED Final   Acinetobacter baumannii NOT DETECTED NOT DETECTED Final   Enterobacteriaceae species NOT DETECTED NOT DETECTED Final   Enterobacter cloacae complex NOT DETECTED NOT DETECTED Final   Escherichia coli NOT DETECTED NOT DETECTED Final   Klebsiella oxytoca NOT DETECTED NOT DETECTED Final   Klebsiella pneumoniae NOT DETECTED NOT DETECTED Final   Proteus species NOT DETECTED NOT DETECTED Final   Serratia marcescens NOT DETECTED NOT DETECTED Final   Haemophilus influenzae NOT DETECTED NOT DETECTED Final   Neisseria meningitidis NOT DETECTED NOT DETECTED Final   Pseudomonas aeruginosa NOT DETECTED NOT DETECTED Final   Candida albicans NOT DETECTED NOT DETECTED Final   Candida glabrata NOT DETECTED NOT DETECTED Final   Candida krusei NOT DETECTED NOT DETECTED Final   Candida parapsilosis NOT DETECTED NOT DETECTED Final   Candida tropicalis NOT DETECTED NOT DETECTED Final   Carbapenem resistance NOT DETECTED NOT DETECTED Final   Methicillin resistance NOT DETECTED NOT DETECTED Final   Vancomycin resistance NOT DETECTED NOT DETECTED Final  CULTURE, BLOOD (ROUTINE X 2) w Reflex to PCR ID Panel      Status: None   Collection Time: 10/31/15 11:35 AM  Result Value Ref Range Status   Specimen Description BLOOD RIGHT HAND  Final   Special Requests BOTTLES DRAWN AEROBIC AND ANAEROBIC 2CC  Final   Culture NO GROWTH 5 DAYS  Final   Report Status 11/05/2015 FINAL  Final  CULTURE, BLOOD (ROUTINE X 2) w Reflex to PCR ID Panel     Status: None   Collection Time: 10/31/15 11:35 AM  Result Value Ref Range Status   Specimen Description BLOOD RIGHT ASSIST CONTROL  Final   Special Requests BOTTLES DRAWN AEROBIC AND ANAEROBIC 2CC  Final   Culture NO GROWTH 5 DAYS  Final   Report Status 11/05/2015 FINAL  Final  Wound culture  Status: None   Collection Time: 10/31/15  2:12 PM  Result Value Ref Range Status   Specimen Description ULCER  Final   Special Requests NONE  Final   Gram Stain   Final    RARE WBC SEEN MODERATE GRAM POSITIVE COCCI IN PAIRS IN CLUSTERS RARE GRAM NEGATIVE RODS RARE GRAM POSITIVE RODS    Culture   Final    MODERATE GROWTH PROVIDENCIA RETTGERI HEAVY GROWTH STAPHYLOCOCCUS AUREUS    Report Status 11/03/2015 FINAL  Final   Organism ID, Bacteria PROVIDENCIA RETTGERI  Final   Organism ID, Bacteria STAPHYLOCOCCUS AUREUS  Final      Susceptibility   Staphylococcus aureus - MIC*    CIPROFLOXACIN <=0.5 SENSITIVE Sensitive     ERYTHROMYCIN <=0.25 SENSITIVE Sensitive     GENTAMICIN <=0.5 SENSITIVE Sensitive     OXACILLIN <=0.25 SENSITIVE Sensitive     TRIMETH/SULFA <=10 SENSITIVE Sensitive     CLINDAMYCIN <=0.25 SENSITIVE Sensitive     CEFOXITIN SCREEN NEGATIVE Sensitive     Inducible Clindamycin NEGATIVE Sensitive     TETRACYCLINE Value in next row Sensitive      SENSITIVE<=1    * HEAVY GROWTH STAPHYLOCOCCUS AUREUS   Providencia rettgeri - MIC*    AMPICILLIN Value in next row Resistant      SENSITIVE<=1    CEFTAZIDIME Value in next row Sensitive      SENSITIVE<=1    CEFAZOLIN Value in next row Resistant      SENSITIVE<=1    CEFTRIAXONE Value in next row Sensitive       SENSITIVE<=1    CIPROFLOXACIN Value in next row Sensitive      SENSITIVE<=1    GENTAMICIN Value in next row Sensitive      SENSITIVE<=1    IMIPENEM Value in next row Sensitive      SENSITIVE<=1    TRIMETH/SULFA Value in next row Resistant      SENSITIVE<=1    PIP/TAZO Value in next row Sensitive      SENSITIVE<=4    AMPICILLIN/SULBACTAM Value in next row Sensitive      SENSITIVE<=2    * MODERATE GROWTH PROVIDENCIA RETTGERI  Culture, blood (Routine X 2) w Reflex to ID Panel     Status: None   Collection Time: 11/01/15  5:07 PM  Result Value Ref Range Status   Specimen Description BLOOD RIGHT ASSIST CONTROL  Final   Special Requests BOTTLES DRAWN AEROBIC AND ANAEROBIC 5CC  Final   Culture NO GROWTH 5 DAYS  Final   Report Status 11/06/2015 FINAL  Final  Culture, blood (Routine X 2) w Reflex to ID Panel     Status: None   Collection Time: 11/01/15  5:07 PM  Result Value Ref Range Status   Specimen Description BLOOD RIGHT HAND  Final   Special Requests BOTTLES DRAWN AEROBIC AND ANAEROBIC 3CC  Final   Culture NO GROWTH 5 DAYS  Final   Report Status 11/06/2015 FINAL  Final  Anaerobic culture     Status: None   Collection Time: 11/04/15  7:20 PM  Result Value Ref Range Status   Specimen Description TOE RIGHT GREAT TOE  Final   Special Requests NONE  Final   Culture NO ANAEROBES ISOLATED  Final   Report Status 11/08/2015 FINAL  Final  Wound culture     Status: None   Collection Time: 11/04/15  7:20 PM  Result Value Ref Range Status   Specimen Description WOUND RIGHT GREAT TOE  Final   Special Requests  NONE  Final   Gram Stain RARE WBC SEEN NO ORGANISMS SEEN   Final   Culture NO GROWTH 3 DAYS  Final   Report Status 11/07/2015 FINAL  Final  Wound culture     Status: None (Preliminary result)   Collection Time: 11/07/15  8:46 AM  Result Value Ref Range Status   Specimen Description WOUND  Final   Special Requests NONE  Final   Gram Stain   Final    RARE WBC SEEN FEW RED  BLOOD CELLS RARE GRAM NEGATIVE RODS    Culture   Final    MODERATE GROWTH GRAM NEGATIVE RODS IDENTIFICATION AND SUSCEPTIBILITIES TO FOLLOW ISOLATING ADDITIONAL POSSIBLE PATHOGEN    Report Status PENDING  Incomplete    Radiology Reports Ct Head Wo Contrast  10/30/2015  CLINICAL DATA:  Generalized weakness, diabetes, hypertension, right visual disturbance EXAM: CT HEAD WITHOUT CONTRAST TECHNIQUE: Contiguous axial images were obtained from the base of the skull through the vertex without contrast. COMPARISON:  03/01/2011 FINDINGS: Mild brain atrophy and chronic white matter microvascular ischemic changes throughout the periventricular white matter. These changes have slightly progressed. No acute intracranial hemorrhage, mass lesion, definite infarction, midline shift, herniation, hydrocephalus, or extra-axial fluid collection. Cisterns patent. No cerebellar abnormality. Skull intact. Mastoids and sinuses remain clear. Atherosclerosis of the intracranial vessels. IMPRESSION: Atrophy and slight progression of chronic white matter microvascular ischemic changes. No acute intracranial process by noncontrast CT. Electronically Signed   By: Judie Petit.  Shick M.D.   On: 10/30/2015 18:15   Mr Laqueta Jean ZO Contrast  11/01/2015  CLINICAL DATA:  65 year old male with progressive lower extremity weakness for 6 months. Initial encounter. Diabetes EXAM: MRI HEAD WITHOUT AND WITH CONTRAST TECHNIQUE: Multiplanar, multiecho pulse sequences of the brain and surrounding structures were obtained without and with intravenous contrast. CONTRAST:  16mL MULTIHANCE GADOBENATE DIMEGLUMINE 529 MG/ML IV SOLN COMPARISON:  Head CT without contrast 10/30/2015. [facility] postoperative cervical spine radiograph 07/19/2011. FINDINGS: Major intracranial vascular flow voids are preserved. Late subacute to early chronic appearing right corona radiata 10 mm focus of diffusion signal change (series 100, image 36) with T2  shine through. No restricted diffusion or evidence of acute infarction. Additional patchy bilateral cerebral white matter T2 and FLAIR hyperintensity. Chronic lacunar infarct in the *SCRATCH* SPECT chronic lacunar infarcts in the right thalamus. Mild for age patchy T2 hyperintensity in the pons, but with associated pontine chronic micro hemorrhage (series 9, image 8). No cortical encephalomalacia. No midline shift, mass effect, evidence of mass lesion, ventriculomegaly, extra-axial collection or acute intracranial hemorrhage. Cervicomedullary junction and pituitary are within normal limits. Negative visualized cervical spine aside from hardware susceptibility artifact. No abnormal enhancement identified. Incidental anterior right frontal lobe developmental venous anomaly (normal anatomic variant). Visible internal auditory structures appear normal. Mastoids and paranasal sinuses are clear. Postoperative changes to the globes. Otherwise negative scalp and orbits soft tissues. Normal bone marrow signal. IMPRESSION: No acute intracranial abnormality. Moderate for age chronic small vessel disease with late subacute lacune in the right corona radiata. Electronically Signed   By: Odessa Fleming M.D.   On: 11/01/2015 10:19   Mr Foot Right Wo Contrast  11/01/2015  CLINICAL DATA:  Ulceration in a about 7 mm along the medial right great toe with purulence noted. Erythema extends dorsally in the foot. EXAM: MRI OF THE RIGHT FOREFOOT WITHOUT CONTRAST TECHNIQUE: Multiplanar, multisequence MR imaging was performed. No intravenous contrast was administered. COMPARISON:  10/30/2015 FINDINGS: There is subcutaneous edema diffusely  in the foot especially dorsally, and extending into the ankle both medially and laterally. Low-level edema extends into the toes. On image 32 series 4 there is a defect in the skin and subcutaneous tissues medially along the great toe approximately at the level of the distal phalanx. The on images 23-25 of  series 7 there seems to be abnormal increased inversion recovery weighted signal in the base of the distal phalanx suspicious for early osteomyelitis given the immediately adjacent ulceration. Sharply defined erosions or cystic lesions are present along the Lisfranc joint is, including adjacent to the expected attachment site of the Lisfranc ligament to the second metatarsal base. However, there is no overt malalignment at the Lisfranc joint. Erosions are present at the articulation of the navicular and medial cuneiform. There is thickening of the medial band of the plantar fascia proximally. IMPRESSION: 1. Ulceration medially along the great toe, with underlying edema signal in the marrow of the base of the distal phalanx suspicious for early osteomyelitis. No other osteomyelitis identified. 2. Cystic or erosive lesions along significant portions of the Lisfranc joint and at the articulation of the distal navicular with the medial cuneiform, suspicious for erosive arthropathy such as rheumatoid arthropathy. An alternative would be early Charcot joint. 3. Edema tracking throughout the foot and ankle, especially dorsally, potentially from cellulitis. Electronically Signed   By: Gaylyn Rong M.D.   On: 11/01/2015 10:37   Dg Chest Port 1 View  11/05/2015  CLINICAL DATA:  PICC line placement. EXAM: PORTABLE CHEST 1 VIEW COMPARISON:  10/30/2015 FINDINGS: Right-sided PICC line has been placed, tip overlying the level of the lower superior vena cava. The heart is enlarged. There is mild elevation of the right hemidiaphragm which is stable. Right basilar atelectasis is present. There is no pneumothorax. Cervical hardware consistent with anterior fusion. IMPRESSION: Interval placement of right-sided PICC line. Cardiomegaly. Electronically Signed   By: Norva Pavlov M.D.   On: 11/05/2015 12:01   Dg Chest Port 1 View  10/30/2015  CLINICAL DATA:  Generalized weakness and elevated blood glucose today. EXAM:  PORTABLE CHEST 1 VIEW COMPARISON:  CT chest 04/27/2015. PA and lateral chest 04/27/2015 and 01/22/2015. FINDINGS: Heart size is normal. The lungs are clear. No pneumothorax or pleural effusion. IMPRESSION: No acute disease. Electronically Signed   By: Drusilla Kanner M.D.   On: 10/30/2015 15:14   Dg Toe Great Right  10/30/2015  CLINICAL DATA:  Right toe swelling for 2 weeks.  Diabetic patient. EXAM: RIGHT GREAT TOE COMPARISON:  None. FINDINGS: There is no evidence of fracture or dislocation. There is no evidence of cortical erosions to suggest osteomyelitis radiographically. Heterogeneous appearance of the bony matrix of the first phalanx is noted, with uncertain significance. There is soft tissue swelling of the dorsal and palmar aspect of the mid and forefoot. IMPRESSION: No evidence of fracture or subluxation. No definitive radiographic evidence of osteomyelitis. Heterogeneous appearance of the bony matrix of the first digit with uncertain significance. Electronically Signed   By: Ted Mcalpine M.D.   On: 10/30/2015 13:51     CBC  Recent Labs Lab 11/06/15 0420 11/08/15 0425  WBC 8.6 11.8*  HGB 11.5* 11.6*  HCT 34.5* 35.4*  PLT 249 297  MCV 87.8 87.6  MCH 29.3 28.7  MCHC 33.4 32.7  RDW 14.5 14.8*  LYMPHSABS  --  1.8  MONOABS  --  0.9  EOSABS  --  0.1  BASOSABS  --  0.2*    Chemistries   Recent Labs  Lab 11/02/15 0549 11/06/15 0420 11/06/15 1224 11/07/15 0516  NA  --  140  --  140  K  --  2.7* 3.8 3.7  CL  --  101  --  103  CO2  --  31  --  30  GLUCOSE  --  153*  --  141*  BUN  --  23*  --  18  CREATININE 1.21 0.79  --  0.75  CALCIUM  --  8.2*  --  8.2*  MG  --   --  1.7  --    ------------------------------------------------------------------------------------------------------------------ estimated creatinine clearance is 91.4 mL/min (by C-G formula based on Cr of  0.75). ------------------------------------------------------------------------------------------------------------------ No results for input(s): HGBA1C in the last 72 hours. ------------------------------------------------------------------------------------------------------------------ No results for input(s): CHOL, HDL, LDLCALC, TRIG, CHOLHDL, LDLDIRECT in the last 72 hours. ------------------------------------------------------------------------------------------------------------------ No results for input(s): TSH, T4TOTAL, T3FREE, THYROIDAB in the last 72 hours.  Invalid input(s): FREET3 ------------------------------------------------------------------------------------------------------------------ No results for input(s): VITAMINB12, FOLATE, FERRITIN, TIBC, IRON, RETICCTPCT in the last 72 hours.  Coagulation profile No results for input(s): INR, PROTIME in the last 168 hours.  No results for input(s): DDIMER in the last 72 hours.  Cardiac Enzymes No results for input(s): CKMB, TROPONINI, MYOGLOBIN in the last 168 hours.  Invalid input(s): CK ------------------------------------------------------------------------------------------------------------------ Invalid input(s): POCBNP    Assessment & Plan   # PAD Angiogram on 11/04/2015.   Angioplasty done by vascular.  # Right great toe and foot cellulitis with osteomyelitis and bacteremia  Staph bacteremia and Providencia in wound. On IV unasyn now.   Podiatry consulted vascular for PAD  Vascular did Angiogram and I& D of right toe on 11/04/15  He may need long course of IV abx - as had osteomyelitis and bacteremia.  Wound culture sent from  I & D.  Podiatry did amputation of first toe 11/07/15- now he may not need long course of ABx.  ID followed up today suggested final Abx choice for d/c.   Now we are waiting for podiatry to given final discharge recommendation- after checking his wound tomorrow.  # Elevated troponin  likely from CHF and Infection. No further workup advised by cardiology. ASA, Statin, BB.  # Acute hypoxic respiratory failure secondary to acute on chronic systolic chf - EF 20-25% Changed lasix to PO- stable respi status. Monitor daily weights and intake and output Continue aspirin, beta blocker and statin No acute findings.  # hypokalemia   Replace oral, check Mg.  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 Shaune Pollack has discussed with on-call ophthalmologist Dr. Duke Salvia, who has recommended outpatient follow-up after discharge  # Diabetes mellitus Sliding scale insulin blood sugars elevated. On glipizide. Added Levemir.   Held metformin due to need for angiogram.  blood sugar under control.   Now resume metformin.   Had blood sugar running around 60- 11/08/15- so stopped his scheduled novolog with meals.     Code Status Orders        Start     Ordered   10/30/15 2249  Full code   Continuous     10/30/15 2248    Advance Directive Documentation        Most Recent Value   Type of Advance Directive  Healthcare Power of Attorney   Pre-existing out of facility DNR order (yellow form or pink MOST form)     "MOST" Form in Place?        Consults  Cardiology Likely d/c tomorrow- if  clear from surgical team.  DVT Prophylaxis  heparin  Lab Results  Component Value Date   PLT 297 11/08/2015    Time Spent in minutes   35 minutes  Altamese Dilling M.D on 11/08/2015 at 4:58 PM  Between 7am to 6pm - Pager - [phone]  After 6pm go to [email or web address] - password [email or web address] [facility]  [facility]   Office  [phone]

## 2015-11-09 ENCOUNTER — Encounter
Admission: RE | Admit: 2015-11-09 | Discharge: 2015-11-09 | Disposition: A | Discharge status: Home / Self Care | Payer: Medicare Other | Source: Ambulatory Visit | Attending: Internal Medicine | Admitting: Internal Medicine

## 2015-11-09 DIAGNOSIS — Z794 Long term (current) use of insulin: Secondary | ICD-10-CM | POA: Insufficient documentation

## 2015-11-09 DIAGNOSIS — E119 Type 2 diabetes mellitus without complications: Secondary | ICD-10-CM | POA: Insufficient documentation

## 2015-11-09 LAB — GLUCOSE, CAPILLARY
GLUCOSE-CAPILLARY: 232 mg/dL — AB (ref 65–99)
Glucose-Capillary: 159 mg/dL — ABNORMAL HIGH (ref 65–99)
Glucose-Capillary: 208 mg/dL — ABNORMAL HIGH (ref 65–99)
Glucose-Capillary: 238 mg/dL — ABNORMAL HIGH (ref 65–99)

## 2015-11-09 LAB — SURGICAL PATHOLOGY

## 2015-11-09 MED ORDER — LACTULOSE 10 GM/15ML PO SOLN
30.0000 g | Freq: Once | ORAL | Status: DC
  Filled 2015-11-09: qty 60

## 2015-11-09 NOTE — Progress Notes (Signed)
Per RN in progression rounds patient is not medically stable for D/C today and Dr. Alberteen Spindleline may take patient back to the OR Friday. Clinical Child psychotherapistocial Worker (CSW) made [employer] [facility] of above. CSW contacted [facility] and made her aware of above. CSW will continue to follow and assist as needed.   Jetta LoutBailey Morgan, LCSWA [phone]

## 2015-11-09 NOTE — Care Management Important Message (Signed)
Important Message  Patient Details  Name: Daniel [name] MRN: [id number] Date of Birth: 02/02/1951   Medicare Important Message Given:  Yes    [name] 11/09/2015, 9:07 AM

## 2015-11-09 NOTE — Plan of Care (Signed)
Problem: Physical Regulation: Goal: Ability to maintain clinical measurements within normal limits will improve Outcome: Progressing PT to work with patient Goal: Will remain free from infection Outcome: Progressing IV antibiotics  Problem: Tissue Perfusion: Goal: Risk factors for ineffective tissue perfusion will decrease Outcome: Progressing SQ Lovenox  Problem: Bowel/Gastric: Goal: Will not experience complications related to bowel motility Outcome: Progressing BM 11/08/15

## 2015-11-09 NOTE — Progress Notes (Signed)
Dr. Alberteen Spindleline in, removed post op dressing, old bloody drainage on dressing.  No active bleeding noted.  Dr. Alberteen Spindleline redressed foot and will remove and assess again on Friday.

## 2015-11-09 NOTE — Evaluation (Signed)
Physical Therapy RE-Evaluation Patient Details Name: Daniel [name] MRN: [id number] DOB: Feb 05, 1951 Today's Date: 11/09/2015   History of Present Illness  presented to ER secondary to SOB, chest tightness x2 days; admitted with unstable angina, NSTEMI (troponin peak at 0.82, now trending downward).  Also noted for R great toe redness with ulceration, significant for cellulitis and early osteomyelitis; angiography and I & D on 12/30. Pt is now s/p amputation on first toe on R LE on 11/07/15. Pt with now osteo and gangrene and may need additional surgery. Per Dr. Alberteen Spindle, limited mobility this date.  Clinical Impression  Re-evaluation performed on 11/09/15. Pt is a pleasant 65 year old male who was admitted for SOB with R great toe redness. Pt is now s/p amputation and may need further surgery this week. Pt cleared for limited mobility by podiatry. Pt is able to perform bed mobility with mod I and transfers/ambulation with min assist. Pt demonstrates deficits with strength/endurance/mobility. Pt is not safe to be home alone at this time. Would benefit from skilled PT to address above deficits and promote optimal return to PLOF.      Follow Up Recommendations SNF    Equipment Recommendations       Recommendations for Other Services       Precautions / Restrictions Precautions Precautions: Fall Precaution Comments: limited heel WBing with ortho-wedge shoe to R LE Restrictions Weight Bearing Restrictions: Yes RLE Weight Bearing: Partial weight bearing      Mobility  Bed Mobility Overal bed mobility: Modified Independent             General bed mobility comments: safe technique performed  Transfers Overall transfer level: Needs assistance Equipment used: Rolling walker (2 wheeled) Transfers: Sit to/from Stand Sit to Stand: Min assist         General transfer comment: off loading shoe donned and pt needs cues for correct hand placement. RW used for  assistance  Ambulation/Gait Ambulation/Gait assistance: Min Environmental consultant (Feet): 3 Feet Assistive device: Rolling walker (2 wheeled) Gait Pattern/deviations: Step-to pattern     General Gait Details: antalgic gait pattern with limited WB noted through R foot. Safe use of rw.   Stairs            Wheelchair Mobility    Modified Rankin (Stroke Patients Only)       Balance Overall balance assessment: Needs assistance Sitting-balance support: Feet supported Sitting balance-Leahy Scale: Good     Standing balance support: Bilateral upper extremity supported Standing balance-Leahy Scale: Fair                               Pertinent Vitals/Pain Pain Assessment: 0-10 Pain Score: 5  Pain Location: R foot Pain Intervention(s): Limited activity within patient's tolerance    Home Living Family/patient expects to be discharged to:: Private residence Living Arrangements: Alone   Type of Home: House Home Access: Stairs to enter Entrance Stairs-Rails: Can reach both Entrance Stairs-Number of Steps: 3 Home Layout: Two level;Able to live on main level with bedroom/bathroom Home Equipment: Gilmer Mor - single point;Walker - 2 wheels      Prior Function Level of Independence: Independent with assistive device(s)         Comments: has been using rw recently secondary to increased falls with [name]     Hand Dominance        Extremity/Trunk Assessment   Upper Extremity Assessment: Overall WFL for tasks assessed  Lower Extremity Assessment: Generalized weakness (R LE guarding secondary to operative status)         Communication   Communication: No difficulties  Cognition Arousal/Alertness: Awake/alert Behavior During Therapy: WFL for tasks assessed/performed Overall Cognitive Status: Within Functional Limits for tasks assessed                      General Comments      Exercises        Assessment/Plan    PT  Assessment Patient needs continued PT services  PT Diagnosis Difficulty walking;Generalized weakness;Acute pain   PT Problem List Decreased strength;Decreased range of motion;Decreased activity tolerance;Decreased balance;Decreased mobility;Decreased knowledge of use of DME;Decreased safety awareness;Decreased knowledge of precautions;Decreased skin integrity;Pain  PT Treatment Interventions DME instruction;Gait training;Stair training;Functional mobility training;Therapeutic activities;Therapeutic exercise;Balance training;Patient/family education   PT Goals (Current goals can be found in the Care Plan section) Acute Rehab PT Goals Patient Stated Goal: "to get this pain better" PT Goal Formulation: With patient Time For Goal Achievement: 11/23/15 Potential to Achieve Goals: Good    Frequency Min 2X/week   Barriers to discharge Decreased caregiver support;Inaccessible home environment      Co-evaluation               End of Session Equipment Utilized During Treatment: Gait belt Activity Tolerance: Patient tolerated treatment well Patient left: in chair;with chair alarm set Nurse Communication: Mobility status         Time: [id number] PT Time Calculation (min) (ACUTE ONLY): 14 min   Charges:   PT Evaluation $PT Re-evaluation: 1 Procedure     PT G Codes:        Keyly Baldonado 11/09/2015, 3:19 PM  Elizabeth PalauStephanie Folashade Gamboa, PT, DPT [phone]

## 2015-11-09 NOTE — Progress Notes (Signed)
[facility] - [place] at [facility]                                                                                                                                                                                            Patient Demographics   Daniel [name], is a 65 y.o. male, DOB - [birth year], ZOX:[id number]  Admit date - 10/30/2015   Admitting Physician Ramonita Lab, MD  Outpatient Primary MD for the patient is [name],[name], MD   LOS - 10  Subjective: Patient admitted with shortness of breath,  right toe erythema and pain, also left sided new eye visual difficulties. Continues to have pain right foot. SOB improved. No CP. Afebrile. Off Oxygen  Angio and I& D of right toe is done 11/04/15, less pain. 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   Pain is under control. Constipated  Review of Systems:   CONSTITUTIONAL: No documented fever. No fatigue, weakness. No weight gain, no weight loss.  EYES: No blurry or double vision. Left eye visual difficulties ENT: No tinnitus. No postnasal drip. No redness of the oropharynx.  RESPIRATORY: No cough, no wheeze, no hemoptysis. Positive dyspnea.  CARDIOVASCULAR: No chest pain. No orthopnea. No palpitations. No syncope.  GASTROINTESTINAL: No nausea, no vomiting or diarrhea. No abdominal pain. No melena or hematochezia. Have constipation. GENITOURINARY: No dysuria or hematuria.  ENDOCRINE: No polyuria or nocturia. No heat or cold intolerance.  HEMATOLOGY: No anemia. No bruising. No bleeding.  INTEGUMENTARY: No rashes. No lesions.  MUSCULOSKELETAL: No arthritis. No swelling. No gout. Right foot pain. NEUROLOGIC: No numbness, tingling, or ataxia. No seizure-type activity. Generalized weakness PSYCHIATRIC: No anxiety. No insomnia. No ADD.     Vitals:   Filed Vitals:   11/08/15 1946 11/09/15 0603 11/09/15 0738 11/09/15 1134  BP: 158/65 127/76 140/80 128/70  Pulse: 85 72 71 69  Temp: 98.9 F (37.2 C) 98.6  F (37 C) 98.3 F (36.8 C) 98.2 F (36.8 C)  TempSrc: Oral Oral Oral Oral  Resp: 18 18 16 18   Height:      Weight:  80.898 kg (178 lb 5.6 oz)    SpO2: 100% 96% 97% 94%    Wt Readings from Last 3 Encounters:  11/09/15 80.898 kg (178 lb 5.6 oz)  04/27/15 74.844 kg (165 lb)     Intake/Output Summary (Last 24 hours) at 11/09/15 1418 Last data filed at 11/09/15 1324  Gross per 24 hour  Intake    723 ml  Output    500 ml  Net    223 ml    Physical Exam:   GENERAL: Pleasant-appearing in  no apparent distress.  HEAD, EYES, EARS, NOSE AND THROAT: Atraumatic, normocephalic. Extraocular muscles are intact. Pupils equal and reactive to light. Sclerae anicteric. No conjunctival injection. No oro-pharyngeal erythema.  NECK: Supple. There is no jugular venous distention. No bruits, no lymphadenopathy, no thyromegaly.  HEART: Regular rate and rhythm,. No murmurs, no rubs, no clicks.  LUNGS: Bilateral crackles at the bases ABDOMEN: Soft, flat, nontender, nondistended. Has good bowel sounds. No hepatosplenomegaly appreciated.  EXTREMITIES: right foot redenss, with cynosis on great toe, with tenderness and swelling on whole foot. NEUROLOGIC: The patient is alert, awake, and oriented x3 with no focal motor deficits appreciated bilaterally. Decreased sensations feet. SKIN: Right great toe erythematous surrounding cellulitis- dressing present on right foot. Psych: Not anxious, depressed LN: No inguinal LN enlargement. 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   Antibiotics   Anti-infectives    Start     Dose/Rate Route Frequency Ordered Stop   11/08/15 2000  ciprofloxacin (CIPRO) tablet 500 mg     500 mg Oral 2 times daily 11/08/15 1432     11/08/15 1445  ceFAZolin (ANCEF) IVPB 2 g/50 mL premix     2 g 100 mL/hr over 30 Minutes Intravenous 3 times per day 11/08/15 1432     11/08/15 1200  sodium chloride 0.9 % 100 mL with ampicillin-sulbactam (UNASYN) 3 g infusion  Status:  Discontinued      100 mL/hr  Intravenous 4 times per day 11/08/15 1000 11/08/15 1432   11/02/15 1800  Ampicillin-Sulbactam (UNASYN) 3 g in sodium chloride 0.9 % 100 mL IVPB  Status:  Discontinued     3 g 100 mL/hr over 60 Minutes Intravenous Every 6 hours 11/02/15 1522 11/08/15 1005   11/02/15 1400  piperacillin-tazobactam (ZOSYN) IVPB 3.375 g  Status:  Discontinued     3.375 g 12.5 mL/hr over 240 Minutes Intravenous 3 times per day 11/02/15 1350 11/02/15 1444   10/31/15 1130  Ampicillin-Sulbactam (UNASYN) 3 g in sodium chloride 0.9 % 100 mL IVPB  Status:  Discontinued     3 g 100 mL/hr over 60 Minutes Intravenous Every 6 hours 10/31/15 1035 11/02/15 1334   10/31/15 0500  vancomycin (VANCOCIN) IVPB 1000 mg/200 mL premix  Status:  Discontinued     1,000 mg 200 mL/hr over 60 Minutes Intravenous Every 12 hours 10/30/15 2248 11/02/15 1333   10/30/15 2130  vancomycin (VANCOCIN) IVPB 1000 mg/200 mL premix  Status:  Discontinued     1,000 mg 200 mL/hr over 60 Minutes Intravenous  Once 10/30/15 2120 10/30/15 2141   10/30/15 1645  piperacillin-tazobactam (ZOSYN) IVPB 3.375 g     3.375 g 100 mL/hr over 30 Minutes Intravenous  Once 10/30/15 1633 10/30/15 2019   10/30/15 1645  vancomycin (VANCOCIN) IVPB 1000 mg/200 mL premix     1,000 mg 200 mL/hr over 60 Minutes Intravenous  Once 10/30/15 1633 10/30/15 2019      Medications   Scheduled Meds: . amLODipine  5 mg Oral Daily  . aspirin EC  81 mg Oral Daily  . atorvastatin  40 mg Oral QHS  .  ceFAZolin (ANCEF) IV  2 g Intravenous 3 times per day  . ciprofloxacin  500 mg Oral BID  . docusate sodium  100 mg Oral BID  . enoxaparin (LOVENOX) injection  40 mg Subcutaneous Q24H  . furosemide  40 mg Oral BID  . glipiZIDE  10 mg Oral BID AC  . insulin aspart  0-9 Units Subcutaneous TID AC & HS  .  insulin detemir  5 Units Subcutaneous QHS  . lactulose  30 g Oral Once  . losartan  100 mg Oral Daily  . metFORMIN  500 mg Oral BID WC  . metoprolol tartrate  25 mg Oral  BID  . oxyCODONE  10 mg Oral Q12H  . pantoprazole  40 mg Oral QAC breakfast  . polyethylene glycol  17 g Oral Daily  . potassium chloride  40 mEq Oral BID  . pregabalin  75 mg Oral BID   Continuous Infusions:   PRN Meds:.sodium chloride, acetaminophen, morphine injection, nitroGLYCERIN, ondansetron (ZOFRAN) IV, oxyCODONE, sodium chloride   Data Review:   Micro Results Recent Results (from the past 240 hour(s))  Culture, blood (routine x 2)     Status: None   Collection Time: 10/30/15  4:44 PM  Result Value Ref Range Status   Specimen Description BLOOD LEFT WRIST  Final   Special Requests BOTTLES DRAWN AEROBIC AND ANAEROBIC  2CC  Final   Culture  Setup Time   Final    GRAM POSITIVE COCCI IN CLUSTERS AEROBIC BOTTLE ONLY CRITICAL RESULT CALLED TO, READ BACK BY AND VERIFIED WITH: JASON ROBBINS AT 1710 ON 10/31/15 CTJ    Culture STAPHYLOCOCCUS AUREUS AEROBIC BOTTLE ONLY   Final   Report Status 11/04/2015 FINAL  Final   Organism ID, Bacteria STAPHYLOCOCCUS AUREUS  Final      Susceptibility   Staphylococcus aureus - MIC*    CIPROFLOXACIN <=0.5 SENSITIVE Sensitive     ERYTHROMYCIN 0.5 SENSITIVE Sensitive     GENTAMICIN <=0.5 SENSITIVE Sensitive     OXACILLIN <=0.25 SENSITIVE Sensitive     TRIMETH/SULFA <=10 SENSITIVE Sensitive     CLINDAMYCIN <=0.25 SENSITIVE Sensitive     CEFOXITIN SCREEN NEGATIVE Sensitive     Inducible Clindamycin NEGATIVE Sensitive     TETRACYCLINE Value in next row Sensitive      SENSITIVE<=1    * STAPHYLOCOCCUS AUREUS  Blood Culture ID Panel (Reflexed)     Status: Abnormal   Collection Time: 10/30/15  4:44 PM  Result Value Ref Range Status   Enterococcus species NOT DETECTED NOT DETECTED Final   Listeria monocytogenes NOT DETECTED NOT DETECTED Final   Staphylococcus species DETECTED (A) NOT DETECTED Corrected    Comment: CORRECTED ON 12/27 AT 0801: PREVIOUSLY REPORTED AS NOT DETECTED   Staphylococcus aureus DETECTED (A) NOT DETECTED Final    Comment:  CRITICAL RESULT CALLED TO, READ BACK BY AND VERIFIED WITH: JASON ROBBINS AT 1710 ON 10/31/15 CTJ    Streptococcus species NOT DETECTED NOT DETECTED Final   Streptococcus agalactiae NOT DETECTED NOT DETECTED Final   Streptococcus pneumoniae NOT DETECTED NOT DETECTED Final   Streptococcus pyogenes NOT DETECTED NOT DETECTED Final   Acinetobacter baumannii NOT DETECTED NOT DETECTED Final   Enterobacteriaceae species NOT DETECTED NOT DETECTED Final   Enterobacter cloacae complex NOT DETECTED NOT DETECTED Final   Escherichia coli NOT DETECTED NOT DETECTED Final   Klebsiella oxytoca NOT DETECTED NOT DETECTED Final   Klebsiella pneumoniae NOT DETECTED NOT DETECTED Final   Proteus species NOT DETECTED NOT DETECTED Final   Serratia marcescens NOT DETECTED NOT DETECTED Final   Haemophilus influenzae NOT DETECTED NOT DETECTED Final   Neisseria meningitidis NOT DETECTED NOT DETECTED Final   Pseudomonas aeruginosa NOT DETECTED NOT DETECTED Final   Candida albicans NOT DETECTED NOT DETECTED Final   Candida glabrata NOT DETECTED NOT DETECTED Final   Candida krusei NOT DETECTED NOT DETECTED Final   Candida parapsilosis NOT DETECTED NOT DETECTED  Final   Candida tropicalis NOT DETECTED NOT DETECTED Final   Carbapenem resistance NOT DETECTED NOT DETECTED Final   Methicillin resistance NOT DETECTED NOT DETECTED Final   Vancomycin resistance NOT DETECTED NOT DETECTED Final  Culture, blood (routine x 2)     Status: None   Collection Time: 10/30/15  5:31 PM  Result Value Ref Range Status   Specimen Description BLOOD LEFT HAND  Final   Special Requests BOTTLES DRAWN AEROBIC AND ANAEROBIC  2CC  Final   Culture  Setup Time   Final    GRAM POSITIVE COCCI IN CHAINS IN BOTH AEROBIC AND ANAEROBIC BOTTLES CRITICAL RESULT CALLED TO, READ BACK BY AND VERIFIED WITH: KAREN HAYES AT 1045 10/31/15 CTJ    Culture   Final    VIRIDANS STREPTOCOCCUS IN BOTH AEROBIC AND ANAEROBIC BOTTLES MULTIPLE SPECIES  PRESENT Results consistent with contamination.    Report Status 11/02/2015 FINAL  Final  Blood Culture ID Panel (Reflexed)     Status: Abnormal   Collection Time: 10/30/15  5:31 PM  Result Value Ref Range Status   Enterococcus species NOT DETECTED NOT DETECTED Final   Listeria monocytogenes NOT DETECTED NOT DETECTED Final   Staphylococcus species NOT DETECTED NOT DETECTED Final   Staphylococcus aureus NOT DETECTED NOT DETECTED Final   Streptococcus species DETECTED (A) NOT DETECTED Final    Comment: CRITICAL RESULT CALLED TO, READ BACK BY AND VERIFIED WITH: KAREN HAYES AT 1045 ON 10/31/15 CTJ    Streptococcus agalactiae NOT DETECTED NOT DETECTED Final   Streptococcus pneumoniae NOT DETECTED NOT DETECTED Final   Streptococcus pyogenes NOT DETECTED NOT DETECTED Final   Acinetobacter baumannii NOT DETECTED NOT DETECTED Final   Enterobacteriaceae species NOT DETECTED NOT DETECTED Final   Enterobacter cloacae complex NOT DETECTED NOT DETECTED Final   Escherichia coli NOT DETECTED NOT DETECTED Final   Klebsiella oxytoca NOT DETECTED NOT DETECTED Final   Klebsiella pneumoniae NOT DETECTED NOT DETECTED Final   Proteus species NOT DETECTED NOT DETECTED Final   Serratia marcescens NOT DETECTED NOT DETECTED Final   Haemophilus influenzae NOT DETECTED NOT DETECTED Final   Neisseria meningitidis NOT DETECTED NOT DETECTED Final   Pseudomonas aeruginosa NOT DETECTED NOT DETECTED Final   Candida albicans NOT DETECTED NOT DETECTED Final   Candida glabrata NOT DETECTED NOT DETECTED Final   Candida krusei NOT DETECTED NOT DETECTED Final   Candida parapsilosis NOT DETECTED NOT DETECTED Final   Candida tropicalis NOT DETECTED NOT DETECTED Final   Carbapenem resistance NOT DETECTED NOT DETECTED Final   Methicillin resistance NOT DETECTED NOT DETECTED Final   Vancomycin resistance NOT DETECTED NOT DETECTED Final  CULTURE, BLOOD (ROUTINE X 2) w Reflex to PCR ID Panel     Status: None   Collection Time:  10/31/15 11:35 AM  Result Value Ref Range Status   Specimen Description BLOOD RIGHT HAND  Final   Special Requests BOTTLES DRAWN AEROBIC AND ANAEROBIC 2CC  Final   Culture NO GROWTH 5 DAYS  Final   Report Status 11/05/2015 FINAL  Final  CULTURE, BLOOD (ROUTINE X 2) w Reflex to PCR ID Panel     Status: None   Collection Time: 10/31/15 11:35 AM  Result Value Ref Range Status   Specimen Description BLOOD RIGHT ASSIST CONTROL  Final   Special Requests BOTTLES DRAWN AEROBIC AND ANAEROBIC 2CC  Final   Culture NO GROWTH 5 DAYS  Final   Report Status 11/05/2015 FINAL  Final  Wound culture     Status: None  Collection Time: 10/31/15  2:12 PM  Result Value Ref Range Status   Specimen Description ULCER  Final   Special Requests NONE  Final   Gram Stain   Final    RARE WBC SEEN MODERATE GRAM POSITIVE COCCI IN PAIRS IN CLUSTERS RARE GRAM NEGATIVE RODS RARE GRAM POSITIVE RODS    Culture   Final    MODERATE GROWTH PROVIDENCIA RETTGERI HEAVY GROWTH STAPHYLOCOCCUS AUREUS    Report Status 11/03/2015 FINAL  Final   Organism ID, Bacteria PROVIDENCIA RETTGERI  Final   Organism ID, Bacteria STAPHYLOCOCCUS AUREUS  Final      Susceptibility   Staphylococcus aureus - MIC*    CIPROFLOXACIN <=0.5 SENSITIVE Sensitive     ERYTHROMYCIN <=0.25 SENSITIVE Sensitive     GENTAMICIN <=0.5 SENSITIVE Sensitive     OXACILLIN <=0.25 SENSITIVE Sensitive     TRIMETH/SULFA <=10 SENSITIVE Sensitive     CLINDAMYCIN <=0.25 SENSITIVE Sensitive     CEFOXITIN SCREEN NEGATIVE Sensitive     Inducible Clindamycin NEGATIVE Sensitive     TETRACYCLINE Value in next row Sensitive      SENSITIVE<=1    * HEAVY GROWTH STAPHYLOCOCCUS AUREUS   Providencia rettgeri - MIC*    AMPICILLIN Value in next row Resistant      SENSITIVE<=1    CEFTAZIDIME Value in next row Sensitive      SENSITIVE<=1    CEFAZOLIN Value in next row Resistant      SENSITIVE<=1    CEFTRIAXONE Value in next row Sensitive      SENSITIVE<=1     CIPROFLOXACIN Value in next row Sensitive      SENSITIVE<=1    GENTAMICIN Value in next row Sensitive      SENSITIVE<=1    IMIPENEM Value in next row Sensitive      SENSITIVE<=1    TRIMETH/SULFA Value in next row Resistant      SENSITIVE<=1    PIP/TAZO Value in next row Sensitive      SENSITIVE<=4    AMPICILLIN/SULBACTAM Value in next row Sensitive      SENSITIVE<=2    * MODERATE GROWTH PROVIDENCIA RETTGERI  Culture, blood (Routine X 2) w Reflex to ID Panel     Status: None   Collection Time: 11/01/15  5:07 PM  Result Value Ref Range Status   Specimen Description BLOOD RIGHT ASSIST CONTROL  Final   Special Requests BOTTLES DRAWN AEROBIC AND ANAEROBIC 5CC  Final   Culture NO GROWTH 5 DAYS  Final   Report Status 11/06/2015 FINAL  Final  Culture, blood (Routine X 2) w Reflex to ID Panel     Status: None   Collection Time: 11/01/15  5:07 PM  Result Value Ref Range Status   Specimen Description BLOOD RIGHT HAND  Final   Special Requests BOTTLES DRAWN AEROBIC AND ANAEROBIC 3CC  Final   Culture NO GROWTH 5 DAYS  Final   Report Status 11/06/2015 FINAL  Final  Anaerobic culture     Status: None   Collection Time: 11/04/15  7:20 PM  Result Value Ref Range Status   Specimen Description TOE RIGHT GREAT TOE  Final   Special Requests NONE  Final   Culture NO ANAEROBES ISOLATED  Final   Report Status 11/08/2015 FINAL  Final  Wound culture     Status: None   Collection Time: 11/04/15  7:20 PM  Result Value Ref Range Status   Specimen Description WOUND RIGHT GREAT TOE  Final   Special Requests NONE  Final  Gram Stain RARE WBC SEEN NO ORGANISMS SEEN   Final   Culture NO GROWTH 3 DAYS  Final   Report Status 11/07/2015 FINAL  Final  Anaerobic culture     Status: None (Preliminary result)   Collection Time: 11/07/15  8:46 AM  Result Value Ref Range Status   Specimen Description WOUND  Final   Special Requests NONE  Final   Culture   Final    NO ANAEROBES ISOLATED; CULTURE IN PROGRESS  FOR 5 DAYS   Report Status PENDING  Incomplete  Wound culture     Status: None (Preliminary result)   Collection Time: 11/07/15  8:46 AM  Result Value Ref Range Status   Specimen Description WOUND  Final   Special Requests NONE  Final   Gram Stain   Final    RARE WBC SEEN FEW RED BLOOD CELLS RARE GRAM NEGATIVE RODS    Culture   Final    MODERATE GROWTH PROVIDENCIA RETTGERI LIGHT GROWTH STAPHYLOCOCCUS AUREUS SUSCEPTIBILITIES TO FOLLOW    Report Status PENDING  Incomplete   Organism ID, Bacteria PROVIDENCIA RETTGERI  Final      Susceptibility   Providencia rettgeri - MIC*    AMPICILLIN >=32 RESISTANT Resistant     CEFTAZIDIME <=1 SENSITIVE Sensitive     CEFAZOLIN >=64 RESISTANT Resistant     CEFTRIAXONE <=1 SENSITIVE Sensitive     CIPROFLOXACIN <=0.25 SENSITIVE Sensitive     GENTAMICIN 4 SENSITIVE Sensitive     IMIPENEM 1 SENSITIVE Sensitive     TRIMETH/SULFA >=320 RESISTANT Resistant     PIP/TAZO Value in next row Sensitive      SENSITIVE<=4    * MODERATE GROWTH PROVIDENCIA RETTGERI    Radiology Reports Ct Head Wo Contrast  10/30/2015  CLINICAL DATA:  Generalized weakness, diabetes, hypertension, right visual disturbance EXAM: CT HEAD WITHOUT CONTRAST TECHNIQUE: Contiguous axial images were obtained from the base of the skull through the vertex without contrast. COMPARISON:  03/01/2011 FINDINGS: Mild brain atrophy and chronic white matter microvascular ischemic changes throughout the periventricular white matter. These changes have slightly progressed. No acute intracranial hemorrhage, mass lesion, definite infarction, midline shift, herniation, hydrocephalus, or extra-axial fluid collection. Cisterns patent. No cerebellar abnormality. Skull intact. Mastoids and sinuses remain clear. Atherosclerosis of the intracranial vessels. IMPRESSION: Atrophy and slight progression of chronic white matter microvascular ischemic changes. No acute intracranial process by noncontrast CT.  Electronically Signed   By: Judie Petit.  Shick M.D.   On: 10/30/2015 18:15   Mr Laqueta Jean YN Contrast  11/01/2015  CLINICAL DATA:  65 year old male with progressive lower extremity weakness for 6 months. Initial encounter. Diabetes EXAM: MRI HEAD WITHOUT AND WITH CONTRAST TECHNIQUE: Multiplanar, multiecho pulse sequences of the brain and surrounding structures were obtained without and with intravenous contrast. CONTRAST:  16mL MULTIHANCE GADOBENATE DIMEGLUMINE 529 MG/ML IV SOLN COMPARISON:  Head CT without contrast 10/30/2015. [facility] postoperative cervical spine radiograph 07/19/2011. FINDINGS: Major intracranial vascular flow voids are preserved. Late subacute to early chronic appearing right corona radiata 10 mm focus of diffusion signal change (series 100, image 36) with T2 shine through. No restricted diffusion or evidence of acute infarction. Additional patchy bilateral cerebral white matter T2 and FLAIR hyperintensity. Chronic lacunar infarct in the *SCRATCH* SPECT chronic lacunar infarcts in the right thalamus. Mild for age patchy T2 hyperintensity in the pons, but with associated pontine chronic micro hemorrhage (series 9, image 8). No cortical encephalomalacia. No midline shift, mass effect, evidence of mass lesion, ventriculomegaly,  extra-axial collection or acute intracranial hemorrhage. Cervicomedullary junction and pituitary are within normal limits. Negative visualized cervical spine aside from hardware susceptibility artifact. No abnormal enhancement identified. Incidental anterior right frontal lobe developmental venous anomaly (normal anatomic variant). Visible internal auditory structures appear normal. Mastoids and paranasal sinuses are clear. Postoperative changes to the globes. Otherwise negative scalp and orbits soft tissues. Normal bone marrow signal. IMPRESSION: No acute intracranial abnormality. Moderate for age chronic small vessel disease with late subacute lacune  in the right corona radiata. Electronically Signed   By: Odessa Fleming M.D.   On: 11/01/2015 10:19   Mr Foot Right Wo Contrast  11/01/2015  CLINICAL DATA:  Ulceration in a about 7 mm along the medial right great toe with purulence noted. Erythema extends dorsally in the foot. EXAM: MRI OF THE RIGHT FOREFOOT WITHOUT CONTRAST TECHNIQUE: Multiplanar, multisequence MR imaging was performed. No intravenous contrast was administered. COMPARISON:  10/30/2015 FINDINGS: There is subcutaneous edema diffusely in the foot especially dorsally, and extending into the ankle both medially and laterally. Low-level edema extends into the toes. On image 32 series 4 there is a defect in the skin and subcutaneous tissues medially along the great toe approximately at the level of the distal phalanx. The on images 23-25 of series 7 there seems to be abnormal increased inversion recovery weighted signal in the base of the distal phalanx suspicious for early osteomyelitis given the immediately adjacent ulceration. Sharply defined erosions or cystic lesions are present along the Lisfranc joint is, including adjacent to the expected attachment site of the Lisfranc ligament to the second metatarsal base. However, there is no overt malalignment at the Lisfranc joint. Erosions are present at the articulation of the navicular and medial cuneiform. There is thickening of the medial band of the plantar fascia proximally. IMPRESSION: 1. Ulceration medially along the great toe, with underlying edema signal in the marrow of the base of the distal phalanx suspicious for early osteomyelitis. No other osteomyelitis identified. 2. Cystic or erosive lesions along significant portions of the Lisfranc joint and at the articulation of the distal navicular with the medial cuneiform, suspicious for erosive arthropathy such as rheumatoid arthropathy. An alternative would be early Charcot joint. 3. Edema tracking throughout the foot and ankle, especially dorsally,  potentially from cellulitis. Electronically Signed   By: Gaylyn Rong M.D.   On: 11/01/2015 10:37   Dg Chest Port 1 View  11/05/2015  CLINICAL DATA:  PICC line placement. EXAM: PORTABLE CHEST 1 VIEW COMPARISON:  10/30/2015 FINDINGS: Right-sided PICC line has been placed, tip overlying the level of the lower superior vena cava. The heart is enlarged. There is mild elevation of the right hemidiaphragm which is stable. Right basilar atelectasis is present. There is no pneumothorax. Cervical hardware consistent with anterior fusion. IMPRESSION: Interval placement of right-sided PICC line. Cardiomegaly. Electronically Signed   By: Norva Pavlov M.D.   On: 11/05/2015 12:01   Dg Chest Port 1 View  10/30/2015  CLINICAL DATA:  Generalized weakness and elevated blood glucose today. EXAM: PORTABLE CHEST 1 VIEW COMPARISON:  CT chest 04/27/2015. PA and lateral chest 04/27/2015 and 01/22/2015. FINDINGS: Heart size is normal. The lungs are clear. No pneumothorax or pleural effusion. IMPRESSION: No acute disease. Electronically Signed   By: Drusilla Kanner M.D.   On: 10/30/2015 15:14   Dg Toe Great Right  10/30/2015  CLINICAL DATA:  Right toe swelling for 2 weeks.  Diabetic patient. EXAM: RIGHT GREAT TOE COMPARISON:  None. FINDINGS: There is no  evidence of fracture or dislocation. There is no evidence of cortical erosions to suggest osteomyelitis radiographically. Heterogeneous appearance of the bony matrix of the first phalanx is noted, with uncertain significance. There is soft tissue swelling of the dorsal and palmar aspect of the mid and forefoot. IMPRESSION: No evidence of fracture or subluxation. No definitive radiographic evidence of osteomyelitis. Heterogeneous appearance of the bony matrix of the first digit with uncertain significance. Electronically Signed   By: Ted Mcalpine M.D.   On: 10/30/2015 13:51     CBC  Recent Labs Lab 11/06/15 0420 11/08/15 0425  WBC 8.6 11.8*  HGB 11.5*  11.6*  HCT 34.5* 35.4*  PLT 249 297  MCV 87.8 87.6  MCH 29.3 28.7  MCHC 33.4 32.7  RDW 14.5 14.8*  LYMPHSABS  --  1.8  MONOABS  --  0.9  EOSABS  --  0.1  BASOSABS  --  0.2*    Chemistries   Recent Labs Lab 11/06/15 0420 11/06/15 1224 11/07/15 0516  NA 140  --  140  K 2.7* 3.8 3.7  CL 101  --  103  CO2 31  --  30  GLUCOSE 153*  --  141*  BUN 23*  --  18  CREATININE 0.79  --  0.75  CALCIUM 8.2*  --  8.2*  MG  --  1.7  --    ------------------------------------------------------------------------------------------------------------------ estimated creatinine clearance is 91.4 mL/min (by C-G formula based on Cr of 0.75). ------------------------------------------------------------------------------------------------------------------ No results for input(s): HGBA1C in the last 72 hours. ------------------------------------------------------------------------------------------------------------------ No results for input(s): CHOL, HDL, LDLCALC, TRIG, CHOLHDL, LDLDIRECT in the last 72 hours. ------------------------------------------------------------------------------------------------------------------ No results for input(s): TSH, T4TOTAL, T3FREE, THYROIDAB in the last 72 hours.  Invalid input(s): FREET3 ------------------------------------------------------------------------------------------------------------------ No results for input(s): VITAMINB12, FOLATE, FERRITIN, TIBC, IRON, RETICCTPCT in the last 72 hours.  Coagulation profile No results for input(s): INR, PROTIME in the last 168 hours.  No results for input(s): DDIMER in the last 72 hours.  Cardiac Enzymes No results for input(s): CKMB, TROPONINI, MYOGLOBIN in the last 168 hours.  Invalid input(s): CK ------------------------------------------------------------------------------------------------------------------ Invalid input(s): POCBNP    Assessment & Plan   # PAD Angiogram on 11/04/2015.    Angioplasty done by vascular.  # Right great toe and foot cellulitis with osteomyelitis and MSSA bacteremia Providencia in wound.  Vascular did Angiogram and I& D of right toe on 11/04/15  Podiatry did amputation of first toe 11/07/15 Podiatry to follow on wound in 2 days for discharge IV abx with Ancef and Cipro  # Elevated troponin likely from CHF and Infection. No further workup advised by cardiology. ASA, Statin, BB.  # Acute hypoxic respiratory failure secondary to acute on chronic systolic chf - EF 20-25% Changed lasix to PO- stable respi status. Monitor daily weights and intake and output Continue aspirin, beta blocker and statin No acute findings.  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 Shaune Pollack has discussed with on-call ophthalmologist Dr. Duke Salvia, who has recommended outpatient follow-up after discharge  # Diabetes mellitus Sliding scale insulin blood sugars elevated. On glipizide. Added Levemir.  blood sugar under control.  resumed metformin.   Had blood sugar running around 60- 11/08/15- so stopped his scheduled novolog with meals.     Code Status Orders        Start     Ordered   10/30/15 2249  Full code   Continuous     10/30/15 2248    Advance Directive Documentation  Most Recent Value   Type of Advance Directive  Healthcare Power of Attorney   Pre-existing out of facility DNR order (yellow form or pink MOST form)     "MOST" Form in Place?        Consults  Cardiology  D/C 2-3 days  DVT Prophylaxis  heparin  Lab Results  Component Value Date   PLT 297 11/08/2015    Time Spent in minutes   35 minutes  Milagros Loll R M.D on 11/09/2015 at 2:18 PM  Between 7am to 6pm - Pager - [phone]  After 6pm go to [email or web address] - password [email or web address] [facility]  [facility]   Office  [phone]

## 2015-11-09 NOTE — Progress Notes (Signed)
[facility] PROGRESS NOTE Date of Admission:  10/30/2015     ID: Daniel [name] is a 65 y.o. male with MSSA bacteremia, viridan strep bacteremia (possible contaminant), wound infection.    Active Problems:   Unstable angina (HCC)  Subjective: Underwent amputation 1/2 with Amputation right great toe with partial first ray resection. Some mild throbbing in foot at night.   ROS  Eleven systems are reviewed and negative except per hpi  Medications:  Antibiotics Given (last 72 hours)    Date/Time Action Medication Dose Rate   11/06/15 1706 Given   Ampicillin-Sulbactam (UNASYN) 3 g in sodium chloride 0.9 % 100 mL IVPB 3 g 100 mL/hr   11/07/15 0000 Given   Ampicillin-Sulbactam (UNASYN) 3 g in sodium chloride 0.9 % 100 mL IVPB 3 g 100 mL/hr   11/07/15 0536 Given   Ampicillin-Sulbactam (UNASYN) 3 g in sodium chloride 0.9 % 100 mL IVPB 3 g 100 mL/hr   11/07/15 1201 Given   Ampicillin-Sulbactam (UNASYN) 3 g in sodium chloride 0.9 % 100 mL IVPB 3 g 100 mL/hr   11/07/15 1719 Given   Ampicillin-Sulbactam (UNASYN) 3 g in sodium chloride 0.9 % 100 mL IVPB 3 g 100 mL/hr   11/08/15 0034 Given   Ampicillin-Sulbactam (UNASYN) 3 g in sodium chloride 0.9 % 100 mL IVPB 3 g 100 mL/hr   11/08/15 0544 Given   Ampicillin-Sulbactam (UNASYN) 3 g in sodium chloride 0.9 % 100 mL IVPB 3 g 100 mL/hr   11/08/15 1524 Given   ceFAZolin (ANCEF) IVPB 2 g/50 mL premix 2 g 100 mL/hr   11/08/15 2105 Given   ceFAZolin (ANCEF) IVPB 2 g/50 mL premix 2 g 100 mL/hr   11/08/15 2105 Given   ciprofloxacin (CIPRO) tablet 500 mg 500 mg    11/09/15 0615 Given   ceFAZolin (ANCEF) IVPB 2 g/50 mL premix 2 g 100 mL/hr   11/09/15 0753 Given   ciprofloxacin (CIPRO) tablet 500 mg 500 mg      . amLODipine  5 mg Oral Daily  . aspirin EC  81 mg Oral Daily  . atorvastatin  40 mg Oral QHS  .  ceFAZolin (ANCEF) IV  2 g Intravenous 3 times per day  . ciprofloxacin  500 mg Oral BID  . docusate sodium  100 mg  Oral BID  . enoxaparin (LOVENOX) injection  40 mg Subcutaneous Q24H  . furosemide  40 mg Oral BID  . glipiZIDE  10 mg Oral BID AC  . insulin aspart  0-9 Units Subcutaneous TID AC & HS  . insulin detemir  5 Units Subcutaneous QHS  . lactulose  30 g Oral Once  . losartan  100 mg Oral Daily  . metFORMIN  500 mg Oral BID WC  . metoprolol tartrate  25 mg Oral BID  . oxyCODONE  10 mg Oral Q12H  . pantoprazole  40 mg Oral QAC breakfast  . polyethylene glycol  17 g Oral Daily  . potassium chloride  40 mEq Oral BID  . pregabalin  75 mg Oral BID    Objective: Vital signs in last 24 hours: Temp:  [98.2 F (36.8 C)-98.9 F (37.2 C)] 98.2 F (36.8 C) (01/04 1134) Pulse Rate:  [69-85] 69 (01/04 1134) Resp:  [16-18] 18 (01/04 1134) BP: (127-158)/(65-80) 128/70 mmHg (01/04 1134) SpO2:  [94 %-100 %] 94 % (01/04 1134) Weight:  [80.898 kg (178 lb 5.6 oz)] 80.898 kg (178 lb 5.6 oz) (01/04 0603) Constitutional: He is oriented to person, place,  and time. dishelved HENT: PERRLA, EOMI, anicteric Mouth/Throat: Oropharynx is clear and moist. No oropharyngeal exudate.  Cardiovascular: Normal rate, regular rhythm and normal heart sounds.  Pulmonary/Chest: Effort normal and breath sounds normal. No respiratory distress. He has no wheezes.  Abdominal: Soft. Bowel sounds are normal. He exhibits no distension. There is no tenderness.  Lymphadenopathy: He has no cervical adenopathy.  Neurological: He is alert and oriented to person, place, and time.  Skin: R foot wrapped post op Psychiatric: He has a normal mood and affect. His behavior is normal.   Lab Results  Recent Labs  11/07/15 0516 11/08/15 0425  WBC  --  11.8*  HGB  --  11.6*  HCT  --  35.4*  NA 140  --   K 3.7  --   CL 103  --   CO2 30  --   BUN 18  --   CREATININE 0.75  --     Microbiology: Results for orders placed or performed during the hospital encounter of 10/30/15  Culture, blood (routine x 2)     Status: None    Collection Time: 10/30/15  4:44 PM  Result Value Ref Range Status   Specimen Description BLOOD LEFT WRIST  Final   Special Requests BOTTLES DRAWN AEROBIC AND ANAEROBIC  2CC  Final   Culture  Setup Time   Final    GRAM POSITIVE COCCI IN CLUSTERS AEROBIC BOTTLE ONLY CRITICAL RESULT CALLED TO, READ BACK BY AND VERIFIED WITH: JASON ROBBINS AT 2440 ON 10/31/15 CTJ    Culture STAPHYLOCOCCUS AUREUS AEROBIC BOTTLE ONLY   Final   Report Status 11/04/2015 FINAL  Final   Organism ID, Bacteria STAPHYLOCOCCUS AUREUS  Final      Susceptibility   Staphylococcus aureus - MIC*    CIPROFLOXACIN <=0.5 SENSITIVE Sensitive     ERYTHROMYCIN 0.5 SENSITIVE Sensitive     GENTAMICIN <=0.5 SENSITIVE Sensitive     OXACILLIN <=0.25 SENSITIVE Sensitive     TRIMETH/SULFA <=10 SENSITIVE Sensitive     CLINDAMYCIN <=0.25 SENSITIVE Sensitive     CEFOXITIN SCREEN NEGATIVE Sensitive     Inducible Clindamycin NEGATIVE Sensitive     TETRACYCLINE Value in next row Sensitive      SENSITIVE<=1    * STAPHYLOCOCCUS AUREUS  Blood Culture ID Panel (Reflexed)     Status: Abnormal   Collection Time: 10/30/15  4:44 PM  Result Value Ref Range Status   Enterococcus species NOT DETECTED NOT DETECTED Final   Listeria monocytogenes NOT DETECTED NOT DETECTED Final   Staphylococcus species DETECTED (A) NOT DETECTED Corrected    Comment: CORRECTED ON 12/27 AT 0801: PREVIOUSLY REPORTED AS NOT DETECTED   Staphylococcus aureus DETECTED (A) NOT DETECTED Final    Comment: CRITICAL RESULT CALLED TO, READ BACK BY AND VERIFIED WITH: JASON ROBBINS AT 1710 ON 10/31/15 CTJ    Streptococcus species NOT DETECTED NOT DETECTED Final   Streptococcus agalactiae NOT DETECTED NOT DETECTED Final   Streptococcus pneumoniae NOT DETECTED NOT DETECTED Final   Streptococcus pyogenes NOT DETECTED NOT DETECTED Final   Acinetobacter baumannii NOT DETECTED NOT DETECTED Final   Enterobacteriaceae species NOT DETECTED NOT DETECTED Final   Enterobacter cloacae  complex NOT DETECTED NOT DETECTED Final   Escherichia coli NOT DETECTED NOT DETECTED Final   Klebsiella oxytoca NOT DETECTED NOT DETECTED Final   Klebsiella pneumoniae NOT DETECTED NOT DETECTED Final   Proteus species NOT DETECTED NOT DETECTED Final   Serratia marcescens NOT DETECTED NOT DETECTED Final   Haemophilus influenzae  NOT DETECTED NOT DETECTED Final   Neisseria meningitidis NOT DETECTED NOT DETECTED Final   Pseudomonas aeruginosa NOT DETECTED NOT DETECTED Final   Candida albicans NOT DETECTED NOT DETECTED Final   Candida glabrata NOT DETECTED NOT DETECTED Final   Candida krusei NOT DETECTED NOT DETECTED Final   Candida parapsilosis NOT DETECTED NOT DETECTED Final   Candida tropicalis NOT DETECTED NOT DETECTED Final   Carbapenem resistance NOT DETECTED NOT DETECTED Final   Methicillin resistance NOT DETECTED NOT DETECTED Final   Vancomycin resistance NOT DETECTED NOT DETECTED Final  Culture, blood (routine x 2)     Status: None   Collection Time: 10/30/15  5:31 PM  Result Value Ref Range Status   Specimen Description BLOOD LEFT HAND  Final   Special Requests BOTTLES DRAWN AEROBIC AND ANAEROBIC  2CC  Final   Culture  Setup Time   Final    GRAM POSITIVE COCCI IN CHAINS IN BOTH AEROBIC AND ANAEROBIC BOTTLES CRITICAL RESULT CALLED TO, READ BACK BY AND VERIFIED WITH: KAREN HAYES AT 0254 10/31/15 CTJ    Culture   Final    VIRIDANS STREPTOCOCCUS IN BOTH AEROBIC AND ANAEROBIC BOTTLES MULTIPLE SPECIES PRESENT Results consistent with contamination.    Report Status 11/02/2015 FINAL  Final  Blood Culture ID Panel (Reflexed)     Status: Abnormal   Collection Time: 10/30/15  5:31 PM  Result Value Ref Range Status   Enterococcus species NOT DETECTED NOT DETECTED Final   Listeria monocytogenes NOT DETECTED NOT DETECTED Final   Staphylococcus species NOT DETECTED NOT DETECTED Final   Staphylococcus aureus NOT DETECTED NOT DETECTED Final   Streptococcus species DETECTED (A) NOT  DETECTED Final    Comment: CRITICAL RESULT CALLED TO, READ BACK BY AND VERIFIED WITH: KAREN HAYES AT 1045 ON 10/31/15 CTJ    Streptococcus agalactiae NOT DETECTED NOT DETECTED Final   Streptococcus pneumoniae NOT DETECTED NOT DETECTED Final   Streptococcus pyogenes NOT DETECTED NOT DETECTED Final   Acinetobacter baumannii NOT DETECTED NOT DETECTED Final   Enterobacteriaceae species NOT DETECTED NOT DETECTED Final   Enterobacter cloacae complex NOT DETECTED NOT DETECTED Final   Escherichia coli NOT DETECTED NOT DETECTED Final   Klebsiella oxytoca NOT DETECTED NOT DETECTED Final   Klebsiella pneumoniae NOT DETECTED NOT DETECTED Final   Proteus species NOT DETECTED NOT DETECTED Final   Serratia marcescens NOT DETECTED NOT DETECTED Final   Haemophilus influenzae NOT DETECTED NOT DETECTED Final   Neisseria meningitidis NOT DETECTED NOT DETECTED Final   Pseudomonas aeruginosa NOT DETECTED NOT DETECTED Final   Candida albicans NOT DETECTED NOT DETECTED Final   Candida glabrata NOT DETECTED NOT DETECTED Final   Candida krusei NOT DETECTED NOT DETECTED Final   Candida parapsilosis NOT DETECTED NOT DETECTED Final   Candida tropicalis NOT DETECTED NOT DETECTED Final   Carbapenem resistance NOT DETECTED NOT DETECTED Final   Methicillin resistance NOT DETECTED NOT DETECTED Final   Vancomycin resistance NOT DETECTED NOT DETECTED Final  CULTURE, BLOOD (ROUTINE X 2) w Reflex to PCR ID Panel     Status: None   Collection Time: 10/31/15 11:35 AM  Result Value Ref Range Status   Specimen Description BLOOD RIGHT HAND  Final   Special Requests BOTTLES DRAWN AEROBIC AND ANAEROBIC 2CC  Final   Culture NO GROWTH 5 DAYS  Final   Report Status 11/05/2015 FINAL  Final  CULTURE, BLOOD (ROUTINE X 2) w Reflex to PCR ID Panel     Status: None   Collection Time:  10/31/15 11:35 AM  Result Value Ref Range Status   Specimen Description BLOOD RIGHT ASSIST CONTROL  Final   Special Requests BOTTLES DRAWN AEROBIC AND  ANAEROBIC 2CC  Final   Culture NO GROWTH 5 DAYS  Final   Report Status 11/05/2015 FINAL  Final  Wound culture     Status: None   Collection Time: 10/31/15  2:12 PM  Result Value Ref Range Status   Specimen Description ULCER  Final   Special Requests NONE  Final   Gram Stain   Final    RARE WBC SEEN MODERATE GRAM POSITIVE COCCI IN PAIRS IN CLUSTERS RARE GRAM NEGATIVE RODS RARE GRAM POSITIVE RODS    Culture   Final    MODERATE GROWTH PROVIDENCIA RETTGERI HEAVY GROWTH STAPHYLOCOCCUS AUREUS    Report Status 11/03/2015 FINAL  Final   Organism ID, Bacteria PROVIDENCIA RETTGERI  Final   Organism ID, Bacteria STAPHYLOCOCCUS AUREUS  Final      Susceptibility   Staphylococcus aureus - MIC*    CIPROFLOXACIN <=0.5 SENSITIVE Sensitive     ERYTHROMYCIN <=0.25 SENSITIVE Sensitive     GENTAMICIN <=0.5 SENSITIVE Sensitive     OXACILLIN <=0.25 SENSITIVE Sensitive     TRIMETH/SULFA <=10 SENSITIVE Sensitive     CLINDAMYCIN <=0.25 SENSITIVE Sensitive     CEFOXITIN SCREEN NEGATIVE Sensitive     Inducible Clindamycin NEGATIVE Sensitive     TETRACYCLINE Value in next row Sensitive      SENSITIVE<=1    * HEAVY GROWTH STAPHYLOCOCCUS AUREUS   Providencia rettgeri - MIC*    AMPICILLIN Value in next row Resistant      SENSITIVE<=1    CEFTAZIDIME Value in next row Sensitive      SENSITIVE<=1    CEFAZOLIN Value in next row Resistant      SENSITIVE<=1    CEFTRIAXONE Value in next row Sensitive      SENSITIVE<=1    CIPROFLOXACIN Value in next row Sensitive      SENSITIVE<=1    GENTAMICIN Value in next row Sensitive      SENSITIVE<=1    IMIPENEM Value in next row Sensitive      SENSITIVE<=1    TRIMETH/SULFA Value in next row Resistant      SENSITIVE<=1    PIP/TAZO Value in next row Sensitive      SENSITIVE<=4    AMPICILLIN/SULBACTAM Value in next row Sensitive      SENSITIVE<=2    * MODERATE GROWTH PROVIDENCIA RETTGERI  Culture, blood (Routine X 2) w Reflex to ID Panel     Status: None    Collection Time: 11/01/15  5:07 PM  Result Value Ref Range Status   Specimen Description BLOOD RIGHT ASSIST CONTROL  Final   Special Requests BOTTLES DRAWN AEROBIC AND ANAEROBIC 5CC  Final   Culture NO GROWTH 5 DAYS  Final   Report Status 11/06/2015 FINAL  Final  Culture, blood (Routine X 2) w Reflex to ID Panel     Status: None   Collection Time: 11/01/15  5:07 PM  Result Value Ref Range Status   Specimen Description BLOOD RIGHT HAND  Final   Special Requests BOTTLES DRAWN AEROBIC AND ANAEROBIC 3CC  Final   Culture NO GROWTH 5 DAYS  Final   Report Status 11/06/2015 FINAL  Final  Anaerobic culture     Status: None   Collection Time: 11/04/15  7:20 PM  Result Value Ref Range Status   Specimen Description TOE RIGHT GREAT TOE  Final   Special Requests  NONE  Final   Culture NO ANAEROBES ISOLATED  Final   Report Status 11/08/2015 FINAL  Final  Wound culture     Status: None   Collection Time: 11/04/15  7:20 PM  Result Value Ref Range Status   Specimen Description WOUND RIGHT GREAT TOE  Final   Special Requests NONE  Final   Gram Stain RARE WBC SEEN NO ORGANISMS SEEN   Final   Culture NO GROWTH 3 DAYS  Final   Report Status 11/07/2015 FINAL  Final  Anaerobic culture     Status: None (Preliminary result)   Collection Time: 11/07/15  8:46 AM  Result Value Ref Range Status   Specimen Description WOUND  Final   Special Requests NONE  Final   Culture   Final    NO ANAEROBES ISOLATED; CULTURE IN PROGRESS FOR 5 DAYS   Report Status PENDING  Incomplete  Wound culture     Status: None (Preliminary result)   Collection Time: 11/07/15  8:46 AM  Result Value Ref Range Status   Specimen Description WOUND  Final   Special Requests NONE  Final   Gram Stain   Final    RARE WBC SEEN FEW RED BLOOD CELLS RARE GRAM NEGATIVE RODS    Culture   Final    MODERATE GROWTH PROVIDENCIA RETTGERI LIGHT GROWTH STAPHYLOCOCCUS AUREUS SUSCEPTIBILITIES TO FOLLOW    Report Status PENDING  Incomplete    Organism ID, Bacteria PROVIDENCIA RETTGERI  Final      Susceptibility   Providencia rettgeri - MIC*    AMPICILLIN >=32 RESISTANT Resistant     CEFTAZIDIME <=1 SENSITIVE Sensitive     CEFAZOLIN >=64 RESISTANT Resistant     CEFTRIAXONE <=1 SENSITIVE Sensitive     CIPROFLOXACIN <=0.25 SENSITIVE Sensitive     GENTAMICIN 4 SENSITIVE Sensitive     IMIPENEM 1 SENSITIVE Sensitive     TRIMETH/SULFA >=320 RESISTANT Resistant     PIP/TAZO Value in next row Sensitive      SENSITIVE<=4    * MODERATE GROWTH PROVIDENCIA RETTGERI    Studies/Results: No results found. ECHO  - Left ventricle: The cavity size was severely dilated. Systolic function was severely reduced. The estimated ejection fraction was in the range of 20% to 25%. Diffuse hypokinesis. - Aortic valve: Valve area (Vmax): 2.05 cm^2. - Mitral valve: There was mild regurgitation. - Left atrium: The atrium was mildly dilated. - Right atrium: The atrium was mildly dilated.  Assessment/Plan:  Yosiel Thieme is a 65 y.o. 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 Wound cx with heavy growth staph aureus and mod growth Providenceia. A1c 12.2. Vascular eval s/p angioplasty 12/30 . Denies smoking.  ESR 18, CRP 24.9. HIV NR.  FU BCX 12/27 NGTD.  Tissue cx 1/2 with GNR pendign Echo with EF 20-25%, no vegetations noted. PICC placed Wound with some necrosis on edges per podiatry  Recommendations Will need at least 2 weeks IV abx for the S aureus bacteremia - Now that toe is amputated he will likely not need 6 week total IV abx for the osteomyelitis. He will eventually be discharged to SNF - but may need TMA Will cont ancef 2 gm q 8 IV and oral cipro (for the providencia) See abx order sheet  Thank you very much for the consult. Will follow with you.  [place], [place]   11/09/2015,  1:47 PM

## 2015-11-09 NOTE — Progress Notes (Signed)
2 Days Post-Op  Subjective: Patient seen. States it is not really having any pain with the foot.  Objective: Vital signs in last 24 hours: Temp:  [97.8 F (36.6 C)-98.9 F (37.2 C)] 98.3 F (36.8 C) (01/04 0738) Pulse Rate:  [63-85] 71 (01/04 0738) Resp:  [16-19] 16 (01/04 0738) BP: (106-158)/(64-80) 140/80 mmHg (01/04 0738) SpO2:  [96 %-100 %] 97 % (01/04 0738) Weight:  [80.898 kg (178 lb 5.6 oz)] 80.898 kg (178 lb 5.6 oz) (01/04 0603) Last BM Date: 11/08/15  Intake/Output from previous day: 01/03 0701 - 01/04 0700 In: 480 [P.O.:480] Out: 650 [Urine:650] Intake/Output this shift: Total I/O In: 3 [I.V.:3] Out: -   A moderate amount of bleeding is noted on the bandage. A small amount of purulent discharge on the bandage. The incision is well coapted but there is some signs of discoloration with possible early necrosis along the plantar flap of the incision. Still some erythema and edema in the forefoot.  Lab Results:   Recent Labs  11/08/15 0425  WBC 11.8*  HGB 11.6*  HCT 35.4*  PLT 297   BMET  Recent Labs  11/06/15 1224 11/07/15 0516  NA  --  140  K 3.8 3.7  CL  --  103  CO2  --  30  GLUCOSE  --  141*  BUN  --  18  CREATININE  --  0.75  CALCIUM  --  8.2*   PT/INR No results for input(s): LABPROT, INR in the last 72 hours. ABG No results for input(s): PHART, HCO3 in the last 72 hours.  Invalid input(s): PCO2, PO2  Studies/Results: No results found.  Anti-infectives: Anti-infectives    Start     Dose/Rate Route Frequency Ordered Stop   11/08/15 2000  ciprofloxacin (CIPRO) tablet 500 mg     500 mg Oral 2 times daily 11/08/15 1432     11/08/15 1445  ceFAZolin (ANCEF) IVPB 2 g/50 mL premix     2 g 100 mL/hr over 30 Minutes Intravenous 3 times per day 11/08/15 1432     11/08/15 1200  sodium chloride 0.9 % 100 mL with ampicillin-sulbactam (UNASYN) 3 g infusion  Status:  Discontinued     100 mL/hr  Intravenous 4 times per day 11/08/15 1000 11/08/15 1432    11/02/15 1800  Ampicillin-Sulbactam (UNASYN) 3 g in sodium chloride 0.9 % 100 mL IVPB  Status:  Discontinued     3 g 100 mL/hr over 60 Minutes Intravenous Every 6 hours 11/02/15 1522 11/08/15 1005   11/02/15 1400  piperacillin-tazobactam (ZOSYN) IVPB 3.375 g  Status:  Discontinued     3.375 g 12.5 mL/hr over 240 Minutes Intravenous 3 times per day 11/02/15 1350 11/02/15 1444   10/31/15 1130  Ampicillin-Sulbactam (UNASYN) 3 g in sodium chloride 0.9 % 100 mL IVPB  Status:  Discontinued     3 g 100 mL/hr over 60 Minutes Intravenous Every 6 hours 10/31/15 1035 11/02/15 1334   10/31/15 0500  vancomycin (VANCOCIN) IVPB 1000 mg/200 mL premix  Status:  Discontinued     1,000 mg 200 mL/hr over 60 Minutes Intravenous Every 12 hours 10/30/15 2248 11/02/15 1333   10/30/15 2130  vancomycin (VANCOCIN) IVPB 1000 mg/200 mL premix  Status:  Discontinued     1,000 mg 200 mL/hr over 60 Minutes Intravenous  Once 10/30/15 2120 10/30/15 2141   10/30/15 1645  piperacillin-tazobactam (ZOSYN) IVPB 3.375 g     3.375 g 100 mL/hr over 30 Minutes Intravenous  Once 10/30/15  1633 10/30/15 2019   10/30/15 1645  vancomycin (VANCOCIN) IVPB 1000 mg/200 mL premix     1,000 mg 200 mL/hr over 60 Minutes Intravenous  Once 10/30/15 1633 10/30/15 2019      Assessment/Plan: s/p Procedure(s): AMPUTATION TOE and 1st ray amputation (Right) Assessment: Osteomyelitis with gangrene status post amputation with first ray resection   Plan: Betadine applied the incision site followed by a sterile gauze bandage. Discussed with the patient that the current surgical wound may not heal due to the early signs of necrosis and he may require further amputation that would be  at the transmetatarsal amputation level. If no feeling at that level he would most likely require a more proximal amputation. Reassess the wound and a couple of days.  LOS: 10 days    Goran Olden W. 11/09/2015

## 2015-11-09 NOTE — Progress Notes (Signed)
Pt refused Miralax and Lactulose this morning, states he "had the runs yesterday from the medicine"

## 2015-11-10 LAB — WOUND CULTURE

## 2015-11-10 LAB — GLUCOSE, CAPILLARY
GLUCOSE-CAPILLARY: 135 mg/dL — AB (ref 65–99)
GLUCOSE-CAPILLARY: 227 mg/dL — AB (ref 65–99)
Glucose-Capillary: 236 mg/dL — ABNORMAL HIGH (ref 65–99)

## 2015-11-10 MED ORDER — LIVING WELL WITH DIABETES BOOK
Freq: Once | Status: AC
  Administered 2015-11-10: 15:00:00
  Filled 2015-11-10 (×2): qty 1

## 2015-11-10 MED ORDER — POTASSIUM CHLORIDE CRYS ER 20 MEQ PO TBCR
40.0000 meq | EXTENDED_RELEASE_TABLET | Freq: Every day | ORAL | Status: DC
  Administered 2015-11-11 – 2015-11-14 (×4): 40 meq via ORAL
  Filled 2015-11-10 (×4): qty 2

## 2015-11-10 MED ORDER — FUROSEMIDE 40 MG PO TABS
40.0000 mg | ORAL_TABLET | Freq: Every day | ORAL | Status: DC
  Administered 2015-11-10 – 2015-11-14 (×5): 40 mg via ORAL
  Filled 2015-11-10 (×5): qty 1

## 2015-11-10 NOTE — Care Management (Signed)
If podiatry clears 1/6- patient will discharge to skilled nursing 1/6.

## 2015-11-10 NOTE — Plan of Care (Signed)
Problem: Physical Regulation: Goal: Will remain free from infection Outcome: Progressing IV antibiotics     Problem: Tissue Perfusion: Goal: Risk factors for ineffective tissue perfusion will decrease Outcome: Progressing SQ Lovenox

## 2015-11-10 NOTE — Progress Notes (Signed)
[facility] - [place] at [facility]                                                                                                                                                                                            Patient Demographics   Daniel [name], is a 65 y.o. male, DOB - 1951/02/04, WJX:[id number]  Admit date - 10/30/2015   Admitting Physician Ramonita Lab, MD  Outpatient Primary MD for the patient is [name],[name], MD   LOS - 11  Subjective: Patient admitted with shortness of breath,  right toe erythema and pain, also left sided new eye visual difficulties. Continues to have pain right foot. SOB improved. No CP.  Angio and I& D of right toe is done 11/04/15, less pain. 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   Pain is under control.  Review of Systems:   CONSTITUTIONAL: No documented fever. No fatigue, weakness. No weight gain, no weight loss.  EYES: No blurry or double vision. Left eye visual difficulties ENT: No tinnitus. No postnasal drip. No redness of the oropharynx.  RESPIRATORY: No cough, no wheeze, no hemoptysis. Positive dyspnea.  CARDIOVASCULAR: No chest pain. No orthopnea. No palpitations. No syncope.  GASTROINTESTINAL: No nausea, no vomiting or diarrhea. No abdominal pain. No melena or hematochezia. Have constipation. GENITOURINARY: No dysuria or hematuria.  ENDOCRINE: No polyuria or nocturia. No heat or cold intolerance.  HEMATOLOGY: No anemia. No bruising. No bleeding.  INTEGUMENTARY: No rashes. No lesions.  MUSCULOSKELETAL: No arthritis. No swelling. No gout. Right foot pain. NEUROLOGIC: No numbness, tingling, or ataxia. No seizure-type activity. Generalized weakness PSYCHIATRIC: No anxiety. No insomnia. No ADD.     Vitals:   Filed Vitals:   11/09/15 1943 11/10/15 0427 11/10/15 0720 11/10/15 1152  BP: 141/73 126/76 143/90 149/85  Pulse: 79 66 66 70  Temp: 98.1 F (36.7 C) 98.6 F (37 C) 98.1 F (36.7 C) 98.3  F (36.8 C)  TempSrc: Oral Oral Oral Oral  Resp: 16 16 16 18   Height:      Weight:  81.738 kg (180 lb 3.2 oz)    SpO2: 100% 95% 95% 98%    Wt Readings from Last 3 Encounters:  11/10/15 81.738 kg (180 lb 3.2 oz)  04/27/15 74.844 kg (165 lb)     Intake/Output Summary (Last 24 hours) at 11/10/15 1327 Last data filed at 11/10/15 1100  Gross per 24 hour  Intake    340 ml  Output   1825 ml  Net  -1485 ml    Physical Exam:   GENERAL: Pleasant-appearing in no apparent distress.  HEAD, EYES, EARS,  NOSE AND THROAT: Atraumatic, normocephalic. Extraocular muscles are intact. Pupils equal and reactive to light. Sclerae anicteric. No conjunctival injection. No oro-pharyngeal erythema.  NECK: Supple. There is no jugular venous distention. No bruits, no lymphadenopathy, no thyromegaly.  HEART: Regular rate and rhythm,. No murmurs, no rubs, no clicks.  LUNGS: Bilateral crackles at the bases ABDOMEN: Soft, flat, nontender, nondistended. Has good bowel sounds. No hepatosplenomegaly appreciated.  EXTREMITIES: right foot redenss, with cynosis on great toe, with tenderness and swelling on whole foot. NEUROLOGIC: The patient is alert, awake, and oriented x3 with no focal motor deficits appreciated bilaterally. Decreased sensations feet. SKIN: Right great toe erythematous surrounding cellulitis- dressing present on right foot. Psych: Not anxious, depressed LN: No inguinal LN enlargement. 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   Antibiotics   Anti-infectives    Start     Dose/Rate Route Frequency Ordered Stop   11/08/15 2000  ciprofloxacin (CIPRO) tablet 500 mg     500 mg Oral 2 times daily 11/08/15 1432     11/08/15 1445  ceFAZolin (ANCEF) IVPB 2 g/50 mL premix     2 g 100 mL/hr over 30 Minutes Intravenous 3 times per day 11/08/15 1432     11/08/15 1200  sodium chloride 0.9 % 100 mL with ampicillin-sulbactam (UNASYN) 3 g infusion  Status:  Discontinued     100 mL/hr  Intravenous 4 times  per day 11/08/15 1000 11/08/15 1432   11/02/15 1800  Ampicillin-Sulbactam (UNASYN) 3 g in sodium chloride 0.9 % 100 mL IVPB  Status:  Discontinued     3 g 100 mL/hr over 60 Minutes Intravenous Every 6 hours 11/02/15 1522 11/08/15 1005   11/02/15 1400  piperacillin-tazobactam (ZOSYN) IVPB 3.375 g  Status:  Discontinued     3.375 g 12.5 mL/hr over 240 Minutes Intravenous 3 times per day 11/02/15 1350 11/02/15 1444   10/31/15 1130  Ampicillin-Sulbactam (UNASYN) 3 g in sodium chloride 0.9 % 100 mL IVPB  Status:  Discontinued     3 g 100 mL/hr over 60 Minutes Intravenous Every 6 hours 10/31/15 1035 11/02/15 1334   10/31/15 0500  vancomycin (VANCOCIN) IVPB 1000 mg/200 mL premix  Status:  Discontinued     1,000 mg 200 mL/hr over 60 Minutes Intravenous Every 12 hours 10/30/15 2248 11/02/15 1333   10/30/15 2130  vancomycin (VANCOCIN) IVPB 1000 mg/200 mL premix  Status:  Discontinued     1,000 mg 200 mL/hr over 60 Minutes Intravenous  Once 10/30/15 2120 10/30/15 2141   10/30/15 1645  piperacillin-tazobactam (ZOSYN) IVPB 3.375 g     3.375 g 100 mL/hr over 30 Minutes Intravenous  Once 10/30/15 1633 10/30/15 2019   10/30/15 1645  vancomycin (VANCOCIN) IVPB 1000 mg/200 mL premix     1,000 mg 200 mL/hr over 60 Minutes Intravenous  Once 10/30/15 1633 10/30/15 2019      Medications   Scheduled Meds: . amLODipine  5 mg Oral Daily  . aspirin EC  81 mg Oral Daily  . atorvastatin  40 mg Oral QHS  .  ceFAZolin (ANCEF) IV  2 g Intravenous 3 times per day  . ciprofloxacin  500 mg Oral BID  . docusate sodium  100 mg Oral BID  . enoxaparin (LOVENOX) injection  40 mg Subcutaneous Q24H  . furosemide  40 mg Oral Daily  . glipiZIDE  10 mg Oral BID AC  . insulin aspart  0-9 Units Subcutaneous TID AC & HS  . insulin detemir  5 Units Subcutaneous QHS  .  lactulose  30 g Oral Once  . living well with diabetes book   Does not apply Once  . losartan  100 mg Oral Daily  . metFORMIN  500 mg Oral BID WC  .  metoprolol tartrate  25 mg Oral BID  . oxyCODONE  10 mg Oral Q12H  . pantoprazole  40 mg Oral QAC breakfast  . polyethylene glycol  17 g Oral Daily  . potassium chloride  40 mEq Oral BID  . pregabalin  75 mg Oral BID   Continuous Infusions:   PRN Meds:.sodium chloride, acetaminophen, morphine injection, nitroGLYCERIN, ondansetron (ZOFRAN) IV, oxyCODONE, sodium chloride   Data Review:   Micro Results Recent Results (from the past 240 hour(s))  Wound culture     Status: None   Collection Time: 10/31/15  2:12 PM  Result Value Ref Range Status   Specimen Description ULCER  Final   Special Requests NONE  Final   Gram Stain   Final    RARE WBC SEEN MODERATE GRAM POSITIVE COCCI IN PAIRS IN CLUSTERS RARE GRAM NEGATIVE RODS RARE GRAM POSITIVE RODS    Culture   Final    MODERATE GROWTH PROVIDENCIA RETTGERI HEAVY GROWTH STAPHYLOCOCCUS AUREUS    Report Status 11/03/2015 FINAL  Final   Organism ID, Bacteria PROVIDENCIA RETTGERI  Final   Organism ID, Bacteria STAPHYLOCOCCUS AUREUS  Final      Susceptibility   Staphylococcus aureus - MIC*    CIPROFLOXACIN <=0.5 SENSITIVE Sensitive     ERYTHROMYCIN <=0.25 SENSITIVE Sensitive     GENTAMICIN <=0.5 SENSITIVE Sensitive     OXACILLIN <=0.25 SENSITIVE Sensitive     TRIMETH/SULFA <=10 SENSITIVE Sensitive     CLINDAMYCIN <=0.25 SENSITIVE Sensitive     CEFOXITIN SCREEN NEGATIVE Sensitive     Inducible Clindamycin NEGATIVE Sensitive     TETRACYCLINE Value in next row Sensitive      SENSITIVE<=1    * HEAVY GROWTH STAPHYLOCOCCUS AUREUS   Providencia rettgeri - MIC*    AMPICILLIN Value in next row Resistant      SENSITIVE<=1    CEFTAZIDIME Value in next row Sensitive      SENSITIVE<=1    CEFAZOLIN Value in next row Resistant      SENSITIVE<=1    CEFTRIAXONE Value in next row Sensitive      SENSITIVE<=1    CIPROFLOXACIN Value in next row Sensitive      SENSITIVE<=1    GENTAMICIN Value in next row Sensitive      SENSITIVE<=1    IMIPENEM  Value in next row Sensitive      SENSITIVE<=1    TRIMETH/SULFA Value in next row Resistant      SENSITIVE<=1    PIP/TAZO Value in next row Sensitive      SENSITIVE<=4    AMPICILLIN/SULBACTAM Value in next row Sensitive      SENSITIVE<=2    * MODERATE GROWTH PROVIDENCIA RETTGERI  Culture, blood (Routine X 2) w Reflex to ID Panel     Status: None   Collection Time: 11/01/15  5:07 PM  Result Value Ref Range Status   Specimen Description BLOOD RIGHT ASSIST CONTROL  Final   Special Requests BOTTLES DRAWN AEROBIC AND ANAEROBIC 5CC  Final   Culture NO GROWTH 5 DAYS  Final   Report Status 11/06/2015 FINAL  Final  Culture, blood (Routine X 2) w Reflex to ID Panel     Status: None   Collection Time: 11/01/15  5:07 PM  Result Value Ref Range Status  Specimen Description BLOOD RIGHT HAND  Final   Special Requests BOTTLES DRAWN AEROBIC AND ANAEROBIC 3CC  Final   Culture NO GROWTH 5 DAYS  Final   Report Status 11/06/2015 FINAL  Final  Anaerobic culture     Status: None   Collection Time: 11/04/15  7:20 PM  Result Value Ref Range Status   Specimen Description TOE RIGHT GREAT TOE  Final   Special Requests NONE  Final   Culture NO ANAEROBES ISOLATED  Final   Report Status 11/08/2015 FINAL  Final  Wound culture     Status: None   Collection Time: 11/04/15  7:20 PM  Result Value Ref Range Status   Specimen Description WOUND RIGHT GREAT TOE  Final   Special Requests NONE  Final   Gram Stain RARE WBC SEEN NO ORGANISMS SEEN   Final   Culture NO GROWTH 3 DAYS  Final   Report Status 11/07/2015 FINAL  Final  Anaerobic culture     Status: None (Preliminary result)   Collection Time: 11/07/15  8:46 AM  Result Value Ref Range Status   Specimen Description WOUND  Final   Special Requests NONE  Final   Culture   Final    NO ANAEROBES ISOLATED; CULTURE IN PROGRESS FOR 5 DAYS   Report Status PENDING  Incomplete  Wound culture     Status: None   Collection Time: 11/07/15  8:46 AM  Result Value Ref  Range Status   Specimen Description WOUND  Final   Special Requests NONE  Final   Gram Stain   Final    RARE WBC SEEN FEW RED BLOOD CELLS RARE GRAM NEGATIVE RODS    Culture   Final    MODERATE GROWTH PROVIDENCIA RETTGERI LIGHT GROWTH STAPHYLOCOCCUS AUREUS    Report Status 11/10/2015 FINAL  Final   Organism ID, Bacteria PROVIDENCIA RETTGERI  Final   Organism ID, Bacteria STAPHYLOCOCCUS AUREUS  Final      Susceptibility   Staphylococcus aureus - MIC*    CIPROFLOXACIN <=0.5 SENSITIVE Sensitive     ERYTHROMYCIN 0.5 SENSITIVE Sensitive     GENTAMICIN <=0.5 SENSITIVE Sensitive     OXACILLIN <=0.25 SENSITIVE Sensitive     TRIMETH/SULFA <=10 SENSITIVE Sensitive     CLINDAMYCIN <=0.25 SENSITIVE Sensitive     CEFOXITIN SCREEN NEGATIVE Sensitive     Inducible Clindamycin NEGATIVE Sensitive     TETRACYCLINE Value in next row Sensitive      SENSITIVE<=1    * LIGHT GROWTH STAPHYLOCOCCUS AUREUS   Providencia rettgeri - MIC*    AMPICILLIN Value in next row Resistant      SENSITIVE<=1    CEFTAZIDIME Value in next row Sensitive      SENSITIVE<=1    CEFAZOLIN Value in next row Resistant      SENSITIVE<=1    CEFTRIAXONE Value in next row Sensitive      SENSITIVE<=1    CIPROFLOXACIN Value in next row Sensitive      SENSITIVE<=1    GENTAMICIN Value in next row Sensitive      SENSITIVE<=1    IMIPENEM Value in next row Sensitive      SENSITIVE<=1    TRIMETH/SULFA Value in next row Resistant      SENSITIVE<=1    PIP/TAZO Value in next row Sensitive      SENSITIVE<=4    * MODERATE GROWTH PROVIDENCIA RETTGERI    Radiology Reports Ct Head Wo Contrast  10/30/2015  CLINICAL DATA:  Generalized weakness, diabetes, hypertension, right visual disturbance  EXAM: CT HEAD WITHOUT CONTRAST TECHNIQUE: Contiguous axial images were obtained from the base of the skull through the vertex without contrast. COMPARISON:  03/01/2011 FINDINGS: Mild brain atrophy and chronic white matter microvascular ischemic  changes throughout the periventricular white matter. These changes have slightly progressed. No acute intracranial hemorrhage, mass lesion, definite infarction, midline shift, herniation, hydrocephalus, or extra-axial fluid collection. Cisterns patent. No cerebellar abnormality. Skull intact. Mastoids and sinuses remain clear. Atherosclerosis of the intracranial vessels. IMPRESSION: Atrophy and slight progression of chronic white matter microvascular ischemic changes. No acute intracranial process by noncontrast CT. Electronically Signed   By: Judie PetitM.  Shick M.D.   On: 10/30/2015 18:15   Mr Laqueta JeanBrain W ZOWo Contrast  11/01/2015  CLINICAL DATA:  65 year old male with progressive lower extremity weakness for 6 months. Initial encounter. Diabetes EXAM: MRI HEAD WITHOUT AND WITH CONTRAST TECHNIQUE: Multiplanar, multiecho pulse sequences of the brain and surrounding structures were obtained without and with intravenous contrast. CONTRAST:  16mL MULTIHANCE GADOBENATE DIMEGLUMINE 529 MG/ML IV SOLN COMPARISON:  Head CT without contrast 10/30/2015. [facility] postoperative cervical spine radiograph 07/19/2011. FINDINGS: Major intracranial vascular flow voids are preserved. Late subacute to early chronic appearing right corona radiata 10 mm focus of diffusion signal change (series 100, image 36) with T2 shine through. No restricted diffusion or evidence of acute infarction. Additional patchy bilateral cerebral white matter T2 and FLAIR hyperintensity. Chronic lacunar infarct in the *SCRATCH* SPECT chronic lacunar infarcts in the right thalamus. Mild for age patchy T2 hyperintensity in the pons, but with associated pontine chronic micro hemorrhage (series 9, image 8). No cortical encephalomalacia. No midline shift, mass effect, evidence of mass lesion, ventriculomegaly, extra-axial collection or acute intracranial hemorrhage. Cervicomedullary junction and pituitary are within normal limits. Negative  visualized cervical spine aside from hardware susceptibility artifact. No abnormal enhancement identified. Incidental anterior right frontal lobe developmental venous anomaly (normal anatomic variant). Visible internal auditory structures appear normal. Mastoids and paranasal sinuses are clear. Postoperative changes to the globes. Otherwise negative scalp and orbits soft tissues. Normal bone marrow signal. IMPRESSION: No acute intracranial abnormality. Moderate for age chronic small vessel disease with late subacute lacune in the right corona radiata. Electronically Signed   By: Odessa FlemingH  Hall M.D.   On: 11/01/2015 10:19   Mr Foot Right Wo Contrast  11/01/2015  CLINICAL DATA:  Ulceration in a about 7 mm along the medial right great toe with purulence noted. Erythema extends dorsally in the foot. EXAM: MRI OF THE RIGHT FOREFOOT WITHOUT CONTRAST TECHNIQUE: Multiplanar, multisequence MR imaging was performed. No intravenous contrast was administered. COMPARISON:  10/30/2015 FINDINGS: There is subcutaneous edema diffusely in the foot especially dorsally, and extending into the ankle both medially and laterally. Low-level edema extends into the toes. On image 32 series 4 there is a defect in the skin and subcutaneous tissues medially along the great toe approximately at the level of the distal phalanx. The on images 23-25 of series 7 there seems to be abnormal increased inversion recovery weighted signal in the base of the distal phalanx suspicious for early osteomyelitis given the immediately adjacent ulceration. Sharply defined erosions or cystic lesions are present along the Lisfranc joint is, including adjacent to the expected attachment site of the Lisfranc ligament to the second metatarsal base. However, there is no overt malalignment at the Lisfranc joint. Erosions are present at the articulation of the navicular and medial cuneiform. There is thickening of the medial band of the plantar fascia proximally.  IMPRESSION: 1.  Ulceration medially along the great toe, with underlying edema signal in the marrow of the base of the distal phalanx suspicious for early osteomyelitis. No other osteomyelitis identified. 2. Cystic or erosive lesions along significant portions of the Lisfranc joint and at the articulation of the distal navicular with the medial cuneiform, suspicious for erosive arthropathy such as rheumatoid arthropathy. An alternative would be early Charcot joint. 3. Edema tracking throughout the foot and ankle, especially dorsally, potentially from cellulitis. Electronically Signed   By: Gaylyn Rong M.D.   On: 11/01/2015 10:37   Dg Chest Port 1 View  11/05/2015  CLINICAL DATA:  PICC line placement. EXAM: PORTABLE CHEST 1 VIEW COMPARISON:  10/30/2015 FINDINGS: Right-sided PICC line has been placed, tip overlying the level of the lower superior vena cava. The heart is enlarged. There is mild elevation of the right hemidiaphragm which is stable. Right basilar atelectasis is present. There is no pneumothorax. Cervical hardware consistent with anterior fusion. IMPRESSION: Interval placement of right-sided PICC line. Cardiomegaly. Electronically Signed   By: Norva Pavlov M.D.   On: 11/05/2015 12:01   Dg Chest Port 1 View  10/30/2015  CLINICAL DATA:  Generalized weakness and elevated blood glucose today. EXAM: PORTABLE CHEST 1 VIEW COMPARISON:  CT chest 04/27/2015. PA and lateral chest 04/27/2015 and 01/22/2015. FINDINGS: Heart size is normal. The lungs are clear. No pneumothorax or pleural effusion. IMPRESSION: No acute disease. Electronically Signed   By: Drusilla Kanner M.D.   On: 10/30/2015 15:14   Dg Toe Great Right  10/30/2015  CLINICAL DATA:  Right toe swelling for 2 weeks.  Diabetic patient. EXAM: RIGHT GREAT TOE COMPARISON:  None. FINDINGS: There is no evidence of fracture or dislocation. There is no evidence of cortical erosions to suggest osteomyelitis radiographically. Heterogeneous  appearance of the bony matrix of the first phalanx is noted, with uncertain significance. There is soft tissue swelling of the dorsal and palmar aspect of the mid and forefoot. IMPRESSION: No evidence of fracture or subluxation. No definitive radiographic evidence of osteomyelitis. Heterogeneous appearance of the bony matrix of the first digit with uncertain significance. Electronically Signed   By: Ted Mcalpine M.D.   On: 10/30/2015 13:51     CBC  Recent Labs Lab 11/06/15 0420 11/08/15 0425  WBC 8.6 11.8*  HGB 11.5* 11.6*  HCT 34.5* 35.4*  PLT 249 297  MCV 87.8 87.6  MCH 29.3 28.7  MCHC 33.4 32.7  RDW 14.5 14.8*  LYMPHSABS  --  1.8  MONOABS  --  0.9  EOSABS  --  0.1  BASOSABS  --  0.2*    Chemistries   Recent Labs Lab 11/06/15 0420 11/06/15 1224 11/07/15 0516  NA 140  --  140  K 2.7* 3.8 3.7  CL 101  --  103  CO2 31  --  30  GLUCOSE 153*  --  141*  BUN 23*  --  18  CREATININE 0.79  --  0.75  CALCIUM 8.2*  --  8.2*  MG  --  1.7  --    ------------------------------------------------------------------------------------------------------------------ estimated creatinine clearance is 91.4 mL/min (by C-G formula based on Cr of 0.75). ------------------------------------------------------------------------------------------------------------------ No results for input(s): HGBA1C in the last 72 hours. ------------------------------------------------------------------------------------------------------------------ No results for input(s): CHOL, HDL, LDLCALC, TRIG, CHOLHDL, LDLDIRECT in the last 72 hours. ------------------------------------------------------------------------------------------------------------------ No results for input(s): TSH, T4TOTAL, T3FREE, THYROIDAB in the last 72 hours.  Invalid input(s): FREET3 ------------------------------------------------------------------------------------------------------------------ No results for input(s):  VITAMINB12, FOLATE, FERRITIN, TIBC, IRON, RETICCTPCT in the last  72 hours.  Coagulation profile No results for input(s): INR, PROTIME in the last 168 hours.  No results for input(s): DDIMER in the last 72 hours.  Cardiac Enzymes No results for input(s): CKMB, TROPONINI, MYOGLOBIN in the last 168 hours.  Invalid input(s): CK ------------------------------------------------------------------------------------------------------------------ Invalid input(s): POCBNP    Assessment & Plan   # PAD Angiogram on 11/04/2015.   Angioplasty done by vascular.  # Right great toe and foot cellulitis with osteomyelitis and MSSA bacteremia Providencia in wound.  Vascular did Angiogram and I& D of right toe on 11/04/15  Podiatry did amputation of first toe 11/07/15 Podiatry to follow on wound in 2 days for discharge IV abx with Ancef and Cipro  # Elevated troponin likely from CHF and Infection. No further workup advised by cardiology. ASA, Statin, BB.  # Acute hypoxic respiratory failure secondary to acute on chronic systolic chf - EF 20-25% Changed lasix to PO- stable respi status. Monitor daily weights and intake and output Continue aspirin, beta blocker and statin No acute findings.  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 Shaune Pollack has discussed with on-call ophthalmologist Dr. Duke Salvia, who has recommended outpatient follow-up after discharge  # Diabetes mellitus Sliding scale insulin blood sugars elevated. On glipizide. Added Levemir.  blood sugar under control.  resumed metformin.   Had blood sugar running around 60- 11/08/15- so stopped his scheduled novolog with meals.     Code Status Orders        Start     Ordered   10/30/15 2249  Full code   Continuous     10/30/15 2248    Advance Directive Documentation        Most Recent Value   Type of Advance Directive  Healthcare Power of Attorney   Pre-existing out of facility DNR order  (yellow form or pink MOST form)     "MOST" Form in Place?        Consults  Cardiology  D/C 2-3 days  DVT Prophylaxis  heparin  Lab Results  Component Value Date   PLT 297 11/08/2015    Time Spent in minutes   35 minutes  Milagros Loll R M.D on 11/10/2015 at 1:27 PM  Between 7am to 6pm - Pager - [phone]  After 6pm go to [email or web address] - password [email or web address] [facility]  [facility]   Office  [phone]

## 2015-11-11 ENCOUNTER — Inpatient Hospital Stay: Payer: Commercial Managed Care - HMO | Admitting: Certified Registered"

## 2015-11-11 ENCOUNTER — Encounter: Payer: Self-pay | Admitting: Anesthesiology

## 2015-11-11 ENCOUNTER — Encounter
Admission: EM | Disposition: A | Discharge status: Skilled Nursing Facility | Payer: Self-pay | Source: Home / Self Care | Attending: Internal Medicine

## 2015-11-11 HISTORY — PX: IRRIGATION AND DEBRIDEMENT FOOT: SHX6602

## 2015-11-11 LAB — CBC WITH DIFFERENTIAL/PLATELET
BASOS ABS: 0 10*3/uL (ref 0–0.1)
BASOS PCT: 0 %
EOS ABS: 0.2 10*3/uL (ref 0–0.7)
Eosinophils Relative: 2 %
HEMATOCRIT: 37.9 % — AB (ref 40.0–52.0)
HEMOGLOBIN: 12.5 g/dL — AB (ref 13.0–18.0)
Lymphocytes Relative: 15 %
Lymphs Abs: 1.7 10*3/uL (ref 1.0–3.6)
MCH: 28.6 pg (ref 26.0–34.0)
MCHC: 33 g/dL (ref 32.0–36.0)
MCV: 86.8 fL (ref 80.0–100.0)
MONOS PCT: 7 %
Monocytes Absolute: 0.7 10*3/uL (ref 0.2–1.0)
NEUTROS ABS: 8.4 10*3/uL — AB (ref 1.4–6.5)
NEUTROS PCT: 76 %
Platelets: 311 10*3/uL (ref 150–440)
RBC: 4.37 MIL/uL — ABNORMAL LOW (ref 4.40–5.90)
RDW: 15.1 % — AB (ref 11.5–14.5)
WBC: 11 10*3/uL — ABNORMAL HIGH (ref 3.8–10.6)

## 2015-11-11 LAB — BASIC METABOLIC PANEL
Anion gap: 5 (ref 5–15)
BUN: 17 mg/dL (ref 6–20)
CHLORIDE: 102 mmol/L (ref 101–111)
CO2: 29 mmol/L (ref 22–32)
CREATININE: 0.76 mg/dL (ref 0.61–1.24)
Calcium: 8.5 mg/dL — ABNORMAL LOW (ref 8.9–10.3)
GFR calc Af Amer: >60 mL/min (ref >60)
GFR calc non Af Amer: >60 mL/min (ref >60)
GLUCOSE: 207 mg/dL — AB (ref 65–99)
Potassium: 4.1 mmol/L (ref 3.5–5.1)
Sodium: 136 mmol/L (ref 135–145)

## 2015-11-11 LAB — GLUCOSE, CAPILLARY
GLUCOSE-CAPILLARY: 190 mg/dL — AB (ref 65–99)
GLUCOSE-CAPILLARY: 194 mg/dL — AB (ref 65–99)
GLUCOSE-CAPILLARY: 318 mg/dL — AB (ref 65–99)
Glucose-Capillary: 207 mg/dL — ABNORMAL HIGH (ref 65–99)

## 2015-11-11 LAB — ANAEROBIC CULTURE

## 2015-11-11 SURGERY — IRRIGATION AND DEBRIDEMENT FOOT
Anesthesia: Monitor Anesthesia Care | Site: Foot | Laterality: Right | Wound class: Dirty or Infected

## 2015-11-11 MED ORDER — SODIUM CHLORIDE 0.9 % IJ SOLN
3.0000 mL | Freq: Two times a day (BID) | INTRAMUSCULAR | Status: DC
  Administered 2015-11-11 – 2015-11-14 (×6): 3 mL via INTRAVENOUS

## 2015-11-11 MED ORDER — SODIUM CHLORIDE 0.9 % IR SOLN
Status: DC | PRN
  Administered 2015-11-11: 3000 mL

## 2015-11-11 MED ORDER — VANCOMYCIN HCL 1000 MG IV SOLR
INTRAVENOUS | Status: DC | PRN
  Administered 2015-11-11: 1000 mg

## 2015-11-11 MED ORDER — SODIUM CHLORIDE 0.9 % IV SOLN: 250.0000 mL | INTRAVENOUS | Status: DC | PRN

## 2015-11-11 MED ORDER — FENTANYL CITRATE (PF) 100 MCG/2ML IJ SOLN
INTRAMUSCULAR | Status: DC | PRN
  Administered 2015-11-11 (×2): 50 ug via INTRAVENOUS

## 2015-11-11 MED ORDER — LABETALOL HCL 5 MG/ML IV SOLN
5.0000 mg | Freq: Once | INTRAVENOUS | Status: AC
  Administered 2015-11-11: 5 mg via INTRAVENOUS

## 2015-11-11 MED ORDER — SODIUM CHLORIDE 0.9 % IJ SOLN: 3.0000 mL | INTRAMUSCULAR | Status: DC | PRN

## 2015-11-11 MED ORDER — ONDANSETRON HCL 4 MG/2ML IJ SOLN: 4.0000 mg | Freq: Once | INTRAMUSCULAR | Status: DC | PRN

## 2015-11-11 MED ORDER — BUPIVACAINE HCL (PF) 0.5 % IJ SOLN
INTRAMUSCULAR | Status: DC | PRN
  Administered 2015-11-11: 10 mL

## 2015-11-11 MED ORDER — INSULIN DETEMIR 100 UNIT/ML ~~LOC~~ SOLN
10.0000 [IU] | Freq: Every day | SUBCUTANEOUS | Status: DC
Start: 2015-11-11 — End: 2015-11-14
  Administered 2015-11-11 – 2015-11-13 (×3): 10 [IU] via SUBCUTANEOUS
  Filled 2015-11-11 (×5): qty 0.1

## 2015-11-11 MED ORDER — LACTATED RINGERS IV SOLN
INTRAVENOUS | Status: DC | PRN
  Administered 2015-11-11: 18:00:00 via INTRAVENOUS

## 2015-11-11 MED ORDER — PROPOFOL 500 MG/50ML IV EMUL
INTRAVENOUS | Status: DC | PRN
  Administered 2015-11-11: 75 ug/kg/min via INTRAVENOUS

## 2015-11-11 MED ORDER — FENTANYL CITRATE (PF) 100 MCG/2ML IJ SOLN: 25.0000 ug | INTRAMUSCULAR | Status: DC | PRN

## 2015-11-11 MED ORDER — SODIUM CHLORIDE 0.9 % IR SOLN
Status: DC | PRN
  Administered 2015-11-11: 500 mL

## 2015-11-11 MED ORDER — MIDAZOLAM HCL 2 MG/2ML IJ SOLN
INTRAMUSCULAR | Status: DC | PRN
  Administered 2015-11-11: 2 mg via INTRAVENOUS

## 2015-11-11 MED ORDER — LABETALOL HCL 5 MG/ML IV SOLN
INTRAVENOUS | Status: AC
  Filled 2015-11-11: qty 4

## 2015-11-11 SURGICAL SUPPLY — 48 items
BAG COUNTER SPONGE EZ (MISCELLANEOUS) IMPLANT
BANDAGE ELASTIC 4 LF NS (GAUZE/BANDAGES/DRESSINGS) ×3 IMPLANT
BLADE OSC/SAGITTAL MD 9X18.5 (BLADE) ×3 IMPLANT
BLADE OSCILLATING/SAGITTAL (BLADE)
BLADE SURG 15 STRL LF DISP TIS (BLADE) ×1 IMPLANT
BLADE SURG 15 STRL SS (BLADE) ×2
BLADE SW THK.38XMED LNG THN (BLADE) IMPLANT
BNDG ESMARK 4X12 TAN STRL LF (GAUZE/BANDAGES/DRESSINGS) IMPLANT
BNDG GAUZE 4.5X4.1 6PLY STRL (MISCELLANEOUS) ×3 IMPLANT
CANISTER SUCT 1200ML W/VALVE (MISCELLANEOUS) ×3 IMPLANT
COUNTER SPONGE BAG EZ (MISCELLANEOUS)
CUFF TOURN 18 STER (MISCELLANEOUS) IMPLANT
CUFF TOURN DUAL PL 12 NO SLV (MISCELLANEOUS) IMPLANT
DRAPE FLUOR MINI C-ARM 54X84 (DRAPES) ×3 IMPLANT
DURAPREP 26ML APPLICATOR (WOUND CARE) ×3 IMPLANT
GAUZE FLUFF 18X24 1PLY STRL (GAUZE/BANDAGES/DRESSINGS) IMPLANT
GAUZE PETRO XEROFOAM 1X8 (MISCELLANEOUS) ×3 IMPLANT
GAUZE SPONGE 4X4 12PLY STRL (GAUZE/BANDAGES/DRESSINGS) IMPLANT
GAUZE STRETCH 2X75IN STRL (MISCELLANEOUS) ×3 IMPLANT
GLOVE BIO SURGEON STRL SZ7.5 (GLOVE) ×3 IMPLANT
GLOVE INDICATOR 8.0 STRL GRN (GLOVE) ×3 IMPLANT
GOWN STRL REUS W/ TWL LRG LVL3 (GOWN DISPOSABLE) ×2 IMPLANT
GOWN STRL REUS W/TWL LRG LVL3 (GOWN DISPOSABLE) ×4
HANDPIECE VERSAJET DEBRIDEMENT (MISCELLANEOUS) ×3 IMPLANT
KIT STIMULAN RAPID CURE 5CC (Orthopedic Implant) ×3 IMPLANT
LABEL OR SOLS (LABEL) IMPLANT
NEEDLE FILTER BLUNT 18X 1/2SAF (NEEDLE) ×2
NEEDLE FILTER BLUNT 18X1 1/2 (NEEDLE) ×1 IMPLANT
NEEDLE HYPO 25X1 1.5 SAFETY (NEEDLE) ×9 IMPLANT
NS IRRIG 500ML POUR BTL (IV SOLUTION) ×3 IMPLANT
PACK EXTREMITY ARMC (MISCELLANEOUS) ×3 IMPLANT
PAD ABD DERMACEA PRESS 5X9 (GAUZE/BANDAGES/DRESSINGS) ×6 IMPLANT
PAD GROUND ADULT SPLIT (MISCELLANEOUS) ×3 IMPLANT
PENCIL ELECTRO HAND CTR (MISCELLANEOUS) ×3 IMPLANT
RASP SM TEAR CROSS CUT (RASP) ×3 IMPLANT
SOL PREP PVP 2OZ (MISCELLANEOUS) ×3
SOLUTION PREP PVP 2OZ (MISCELLANEOUS) ×1 IMPLANT
STOCKINETTE STRL 4IN 9604848 (GAUZE/BANDAGES/DRESSINGS) ×3 IMPLANT
STOCKINETTE STRL 6IN 960660 (GAUZE/BANDAGES/DRESSINGS) IMPLANT
STRAP SAFETY BODY (MISCELLANEOUS) ×3 IMPLANT
SUT ETHILON 4-0 (SUTURE) ×2
SUT ETHILON 4-0 FS2 18XMFL BLK (SUTURE) ×1
SUT VIC AB 3-0 SH 27 (SUTURE) ×2
SUT VIC AB 3-0 SH 27X BRD (SUTURE) ×1 IMPLANT
SUT VIC AB 4-0 FS2 27 (SUTURE) ×3 IMPLANT
SUTURE ETHLN 4-0 FS2 18XMF BLK (SUTURE) ×1 IMPLANT
SYR 3ML LL SCALE MARK (SYRINGE) ×3 IMPLANT
SYRINGE 10CC LL (SYRINGE) ×6 IMPLANT

## 2015-11-11 NOTE — Progress Notes (Signed)
Clinical Social Worker (CSW) discussed case with MD. Per MD patient is going for I&D today and will not be medically stable over the weekend. [facility]. CS[facility] and left her a voicemail making her aware of above. CSW will continue to follow and assist as needed.   Jetta LoutBailey Morgan, LCSWA [phone]

## 2015-11-11 NOTE — Anesthesia Procedure Notes (Signed)
Date/Time: 11/11/2015 7:10 PM Performed by: Junious SilkNOLES, Brigett Estell Pre-anesthesia Checklist: Patient identified, Emergency Drugs available, Suction available, Patient being monitored and Timeout performed Oxygen Delivery Method: Nasal cannula

## 2015-11-11 NOTE — Progress Notes (Signed)
NSR. Room air. Takes meds ok but NPO. Pt does not report any pain. Consent signed. Pt to go to OR for debridement. Brother at the bedside. Pt has no further concerns at this time. [facility]

## 2015-11-11 NOTE — Progress Notes (Signed)
[facility] [place] [facility]                                                                                                                                                                                            Patient Demographics   Daniel [name], is a 65 y.o. male, DOB - [birth year], ZOX:[id number]  Admit date - 10/30/2015   Admitting Physician Ramonita Lab, MD  Outpatient Primary MD for the patient is [name],[name], MD   LOS - 12  Subjective: Patient admitted with shortness of breath,  right toe erythema and pain, also left sided new eye visual difficulties. Continues to have pain right foot. SOB improved. No CP. Angio and I& D of right toe is done 11/04/15, less pain. 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   Pain is under control.  OR later today.  Review of Systems:   CONSTITUTIONAL: No documented fever. No fatigue, weakness. No weight gain, no weight loss.  EYES: No blurry or double vision. Left eye visual difficulties ENT: No tinnitus. No postnasal drip. No redness of the oropharynx.  RESPIRATORY: No cough, no wheeze, no hemoptysis. Positive dyspnea.  CARDIOVASCULAR: No chest pain. No orthopnea. No palpitations. No syncope.  GASTROINTESTINAL: No nausea, no vomiting or diarrhea. No abdominal pain. No melena or hematochezia. Have constipation. GENITOURINARY: No dysuria or hematuria.  ENDOCRINE: No polyuria or nocturia. No heat or cold intolerance.  HEMATOLOGY: No anemia. No bruising. No bleeding.  INTEGUMENTARY: No rashes. No lesions.  MUSCULOSKELETAL: No arthritis. No swelling. No gout. Right foot pain. NEUROLOGIC: No numbness, tingling, or ataxia. No seizure-type activity. Generalized weakness PSYCHIATRIC: No anxiety. No insomnia. No ADD.     Vitals:   Filed Vitals:   11/10/15 2000 11/11/15 0503 11/11/15 0731 11/11/15 1118  BP: 146/82 155/80 157/94 140/82  Pulse: 71 70 69 62  Temp: 98.7 F (37.1 C) 98.5 F (36.9 C)  97.4  F (36.3 C)  TempSrc: Oral Oral  Oral  Resp: 24 24 20 20   Height:      Weight:  81.874 kg (180 lb 8 oz)    SpO2: 96% 96% 97% 97%    Wt Readings from Last 3 Encounters:  11/11/15 81.874 kg (180 lb 8 oz)  04/27/15 74.844 kg (165 lb)     Intake/Output Summary (Last 24 hours) at 11/11/15 1211 Last data filed at 11/11/15 0949  Gross per 24 hour  Intake    290 ml  Output   1100 ml  Net   -810 ml    Physical Exam:   GENERAL: Pleasant-appearing in no apparent distress.  HEAD, EYES,  EARS, NOSE AND THROAT: Atraumatic, normocephalic. Extraocular muscles are intact. Pupils equal and reactive to light. Sclerae anicteric. No conjunctival injection. No oro-pharyngeal erythema.  NECK: Supple. There is no jugular venous distention. No bruits, no lymphadenopathy, no thyromegaly.  HEART: Regular rate and rhythm,. No murmurs, no rubs, no clicks.  LUNGS: Bilateral crackles at the bases ABDOMEN: Soft, flat, nontender, nondistended. Has good bowel sounds. No hepatosplenomegaly appreciated.  EXTREMITIES: right foot redenss, with cynosis on great toe, with tenderness and swelling on whole foot. NEUROLOGIC: The patient is alert, awake, and oriented x3 with no focal motor deficits appreciated bilaterally. Decreased sensations feet. SKIN: Right great toe erythematous surrounding cellulitis- dressing present on right foot. Psych: Not anxious, depressed LN: No inguinal LN enlargement.   Antibiotics   Anti-infectives    Start     Dose/Rate Route Frequency Ordered Stop   11/08/15 2000  ciprofloxacin (CIPRO) tablet 500 mg     500 mg Oral 2 times daily 11/08/15 1432     11/08/15 1445  ceFAZolin (ANCEF) IVPB 2 g/50 mL premix     2 g 100 mL/hr over 30 Minutes Intravenous 3 times per day 11/08/15 1432     11/08/15 1200  sodium chloride 0.9 % 100 mL with ampicillin-sulbactam (UNASYN) 3 g infusion  Status:  Discontinued     100 mL/hr  Intravenous 4 times per day 11/08/15 1000 11/08/15 1432   11/02/15 1800   Ampicillin-Sulbactam (UNASYN) 3 g in sodium chloride 0.9 % 100 mL IVPB  Status:  Discontinued     3 g 100 mL/hr over 60 Minutes Intravenous Every 6 hours 11/02/15 1522 11/08/15 1005   11/02/15 1400  piperacillin-tazobactam (ZOSYN) IVPB 3.375 g  Status:  Discontinued     3.375 g 12.5 mL/hr over 240 Minutes Intravenous 3 times per day 11/02/15 1350 11/02/15 1444   10/31/15 1130  Ampicillin-Sulbactam (UNASYN) 3 g in sodium chloride 0.9 % 100 mL IVPB  Status:  Discontinued     3 g 100 mL/hr over 60 Minutes Intravenous Every 6 hours 10/31/15 1035 11/02/15 1334   10/31/15 0500  vancomycin (VANCOCIN) IVPB 1000 mg/200 mL premix  Status:  Discontinued     1,000 mg 200 mL/hr over 60 Minutes Intravenous Every 12 hours 10/30/15 2248 11/02/15 1333   10/30/15 2130  vancomycin (VANCOCIN) IVPB 1000 mg/200 mL premix  Status:  Discontinued     1,000 mg 200 mL/hr over 60 Minutes Intravenous  Once 10/30/15 2120 10/30/15 2141   10/30/15 1645  piperacillin-tazobactam (ZOSYN) IVPB 3.375 g     3.375 g 100 mL/hr over 30 Minutes Intravenous  Once 10/30/15 1633 10/30/15 2019   10/30/15 1645  vancomycin (VANCOCIN) IVPB 1000 mg/200 mL premix     1,000 mg 200 mL/hr over 60 Minutes Intravenous  Once 10/30/15 1633 10/30/15 2019      Medications   Scheduled Meds: . amLODipine  5 mg Oral Daily  . aspirin EC  81 mg Oral Daily  . atorvastatin  40 mg Oral QHS  .  ceFAZolin (ANCEF) IV  2 g Intravenous 3 times per day  . ciprofloxacin  500 mg Oral BID  . docusate sodium  100 mg Oral BID  . enoxaparin (LOVENOX) injection  40 mg Subcutaneous Q24H  . furosemide  40 mg Oral Daily  . glipiZIDE  10 mg Oral BID AC  . insulin aspart  0-9 Units Subcutaneous TID AC & HS  . insulin detemir  10 Units Subcutaneous QHS  . lactulose  30 g  Oral Once  . losartan  100 mg Oral Daily  . metFORMIN  500 mg Oral BID WC  . metoprolol tartrate  25 mg Oral BID  . oxyCODONE  10 mg Oral Q12H  . pantoprazole  40 mg Oral QAC breakfast  .  polyethylene glycol  17 g Oral Daily  . potassium chloride  40 mEq Oral Daily  . pregabalin  75 mg Oral BID   Continuous Infusions:   PRN Meds:.sodium chloride, acetaminophen, morphine injection, nitroGLYCERIN, ondansetron (ZOFRAN) IV, oxyCODONE, sodium chloride   Data Review:   Micro Results Recent Results (from the past 240 hour(s))  Culture, blood (Routine X 2) w Reflex to ID Panel     Status: None   Collection Time: 11/01/15  5:07 PM  Result Value Ref Range Status   Specimen Description BLOOD RIGHT ASSIST CONTROL  Final   Special Requests BOTTLES DRAWN AEROBIC AND ANAEROBIC 5CC  Final   Culture NO GROWTH 5 DAYS  Final   Report Status 11/06/2015 FINAL  Final  Culture, blood (Routine X 2) w Reflex to ID Panel     Status: None   Collection Time: 11/01/15  5:07 PM  Result Value Ref Range Status   Specimen Description BLOOD RIGHT HAND  Final   Special Requests BOTTLES DRAWN AEROBIC AND ANAEROBIC 3CC  Final   Culture NO GROWTH 5 DAYS  Final   Report Status 11/06/2015 FINAL  Final  Anaerobic culture     Status: None   Collection Time: 11/04/15  7:20 PM  Result Value Ref Range Status   Specimen Description TOE RIGHT GREAT TOE  Final   Special Requests NONE  Final   Culture NO ANAEROBES ISOLATED  Final   Report Status 11/08/2015 FINAL  Final  Wound culture     Status: None   Collection Time: 11/04/15  7:20 PM  Result Value Ref Range Status   Specimen Description WOUND RIGHT GREAT TOE  Final   Special Requests NONE  Final   Gram Stain RARE WBC SEEN NO ORGANISMS SEEN   Final   Culture NO GROWTH 3 DAYS  Final   Report Status 11/07/2015 FINAL  Final  Anaerobic culture     Status: None (Preliminary result)   Collection Time: 11/07/15  8:46 AM  Result Value Ref Range Status   Specimen Description WOUND  Final   Special Requests NONE  Final   Culture   Final    NO ANAEROBES ISOLATED; CULTURE IN PROGRESS FOR 5 DAYS   Report Status PENDING  Incomplete  Wound culture     Status:  None   Collection Time: 11/07/15  8:46 AM  Result Value Ref Range Status   Specimen Description WOUND  Final   Special Requests NONE  Final   Gram Stain   Final    RARE WBC SEEN FEW RED BLOOD CELLS RARE GRAM NEGATIVE RODS    Culture   Final    MODERATE GROWTH PROVIDENCIA RETTGERI LIGHT GROWTH STAPHYLOCOCCUS AUREUS    Report Status 11/10/2015 FINAL  Final   Organism ID, Bacteria PROVIDENCIA RETTGERI  Final   Organism ID, Bacteria STAPHYLOCOCCUS AUREUS  Final      Susceptibility   Staphylococcus aureus - MIC*    CIPROFLOXACIN <=0.5 SENSITIVE Sensitive     ERYTHROMYCIN 0.5 SENSITIVE Sensitive     GENTAMICIN <=0.5 SENSITIVE Sensitive     OXACILLIN <=0.25 SENSITIVE Sensitive     TRIMETH/SULFA <=10 SENSITIVE Sensitive     CLINDAMYCIN <=0.25 SENSITIVE Sensitive  CEFOXITIN SCREEN NEGATIVE Sensitive     Inducible Clindamycin NEGATIVE Sensitive     TETRACYCLINE Value in next row Sensitive      SENSITIVE<=1    * LIGHT GROWTH STAPHYLOCOCCUS AUREUS   Providencia rettgeri - MIC*    AMPICILLIN Value in next row Resistant      SENSITIVE<=1    CEFTAZIDIME Value in next row Sensitive      SENSITIVE<=1    CEFAZOLIN Value in next row Resistant      SENSITIVE<=1    CEFTRIAXONE Value in next row Sensitive      SENSITIVE<=1    CIPROFLOXACIN Value in next row Sensitive      SENSITIVE<=1    GENTAMICIN Value in next row Sensitive      SENSITIVE<=1    IMIPENEM Value in next row Sensitive      SENSITIVE<=1    TRIMETH/SULFA Value in next row Resistant      SENSITIVE<=1    PIP/TAZO Value in next row Sensitive      SENSITIVE<=4    * MODERATE GROWTH PROVIDENCIA RETTGERI    Radiology Reports Ct Head Wo Contrast  10/30/2015  CLINICAL DATA:  Generalized weakness, diabetes, hypertension, right visual disturbance EXAM: CT HEAD WITHOUT CONTRAST TECHNIQUE: Contiguous axial images were obtained from the base of the skull through the vertex without contrast. COMPARISON:  03/01/2011 FINDINGS: Mild  brain atrophy and chronic white matter microvascular ischemic changes throughout the periventricular white matter. These changes have slightly progressed. No acute intracranial hemorrhage, mass lesion, definite infarction, midline shift, herniation, hydrocephalus, or extra-axial fluid collection. Cisterns patent. No cerebellar abnormality. Skull intact. Mastoids and sinuses remain clear. Atherosclerosis of the intracranial vessels. IMPRESSION: Atrophy and slight progression of chronic white matter microvascular ischemic changes. No acute intracranial process by noncontrast CT. Electronically Signed   By: Judie Petit.  Shick M.D.   On: 10/30/2015 18:15   Mr Laqueta Jean AV Contrast  11/01/2015  CLINICAL DATA:  65 year old male with progressive lower extremity weakness for 6 months. Initial encounter. Diabetes EXAM: MRI HEAD WITHOUT AND WITH CONTRAST TECHNIQUE: Multiplanar, multiecho pulse sequences of the brain and surrounding structures were obtained without and with intravenous contrast. CONTRAST:  16mL MULTIHANCE GADOBENATE DIMEGLUMINE 529 MG/ML IV SOLN COMPARISON:  Head CT without contrast 10/30/2015. [facility] postoperative cervical spine radiograph 07/19/2011. FINDINGS: Major intracranial vascular flow voids are preserved. Late subacute to early chronic appearing right corona radiata 10 mm focus of diffusion signal change (series 100, image 36) with T2 shine through. No restricted diffusion or evidence of acute infarction. Additional patchy bilateral cerebral white matter T2 and FLAIR hyperintensity. Chronic lacunar infarct in the *SCRATCH* SPECT chronic lacunar infarcts in the right thalamus. Mild for age patchy T2 hyperintensity in the pons, but with associated pontine chronic micro hemorrhage (series 9, image 8). No cortical encephalomalacia. No midline shift, mass effect, evidence of mass lesion, ventriculomegaly, extra-axial collection or acute intracranial hemorrhage. Cervicomedullary  junction and pituitary are within normal limits. Negative visualized cervical spine aside from hardware susceptibility artifact. No abnormal enhancement identified. Incidental anterior right frontal lobe developmental venous anomaly (normal anatomic variant). Visible internal auditory structures appear normal. Mastoids and paranasal sinuses are clear. Postoperative changes to the globes. Otherwise negative scalp and orbits soft tissues. Normal bone marrow signal. IMPRESSION: No acute intracranial abnormality. Moderate for age chronic small vessel disease with late subacute lacune in the right corona radiata. Electronically Signed   By: Odessa Fleming M.D.   On: 11/01/2015 10:19   Mr  Foot Right Wo Contrast  11/01/2015  CLINICAL DATA:  Ulceration in a about 7 mm along the medial right great toe with purulence noted. Erythema extends dorsally in the foot. EXAM: MRI OF THE RIGHT FOREFOOT WITHOUT CONTRAST TECHNIQUE: Multiplanar, multisequence MR imaging was performed. No intravenous contrast was administered. COMPARISON:  10/30/2015 FINDINGS: There is subcutaneous edema diffusely in the foot especially dorsally, and extending into the ankle both medially and laterally. Low-level edema extends into the toes. On image 32 series 4 there is a defect in the skin and subcutaneous tissues medially along the great toe approximately at the level of the distal phalanx. The on images 23-25 of series 7 there seems to be abnormal increased inversion recovery weighted signal in the base of the distal phalanx suspicious for early osteomyelitis given the immediately adjacent ulceration. Sharply defined erosions or cystic lesions are present along the Lisfranc joint is, including adjacent to the expected attachment site of the Lisfranc ligament to the second metatarsal base. However, there is no overt malalignment at the Lisfranc joint. Erosions are present at the articulation of the navicular and medial cuneiform. There is thickening of  the medial band of the plantar fascia proximally. IMPRESSION: 1. Ulceration medially along the great toe, with underlying edema signal in the marrow of the base of the distal phalanx suspicious for early osteomyelitis. No other osteomyelitis identified. 2. Cystic or erosive lesions along significant portions of the Lisfranc joint and at the articulation of the distal navicular with the medial cuneiform, suspicious for erosive arthropathy such as rheumatoid arthropathy. An alternative would be early Charcot joint. 3. Edema tracking throughout the foot and ankle, especially dorsally, potentially from cellulitis. Electronically Signed   By: Gaylyn Rong M.D.   On: 11/01/2015 10:37   Dg Chest Port 1 View  11/05/2015  CLINICAL DATA:  PICC line placement. EXAM: PORTABLE CHEST 1 VIEW COMPARISON:  10/30/2015 FINDINGS: Right-sided PICC line has been placed, tip overlying the level of the lower superior vena cava. The heart is enlarged. There is mild elevation of the right hemidiaphragm which is stable. Right basilar atelectasis is present. There is no pneumothorax. Cervical hardware consistent with anterior fusion. IMPRESSION: Interval placement of right-sided PICC line. Cardiomegaly. Electronically Signed   By: Norva Pavlov M.D.   On: 11/05/2015 12:01   Dg Chest Port 1 View  10/30/2015  CLINICAL DATA:  Generalized weakness and elevated blood glucose today. EXAM: PORTABLE CHEST 1 VIEW COMPARISON:  CT chest 04/27/2015. PA and lateral chest 04/27/2015 and 01/22/2015. FINDINGS: Heart size is normal. The lungs are clear. No pneumothorax or pleural effusion. IMPRESSION: No acute disease. Electronically Signed   By: Drusilla Kanner M.D.   On: 10/30/2015 15:14   Dg Toe Great Right  10/30/2015  CLINICAL DATA:  Right toe swelling for 2 weeks.  Diabetic patient. EXAM: RIGHT GREAT TOE COMPARISON:  None. FINDINGS: There is no evidence of fracture or dislocation. There is no evidence of cortical erosions to suggest  osteomyelitis radiographically. Heterogeneous appearance of the bony matrix of the first phalanx is noted, with uncertain significance. There is soft tissue swelling of the dorsal and palmar aspect of the mid and forefoot. IMPRESSION: No evidence of fracture or subluxation. No definitive radiographic evidence of osteomyelitis. Heterogeneous appearance of the bony matrix of the first digit with uncertain significance. Electronically Signed   By: Ted Mcalpine M.D.   On: 10/30/2015 13:51     CBC  Recent Labs Lab 11/06/15 0420 11/08/15 0425 11/11/15 0547  WBC  8.6 11.8* 11.0*  HGB 11.5* 11.6* 12.5*  HCT 34.5* 35.4* 37.9*  PLT 249 297 311  MCV 87.8 87.6 86.8  MCH 29.3 28.7 28.6  MCHC 33.4 32.7 33.0  RDW 14.5 14.8* 15.1*  LYMPHSABS  --  1.8 1.7  MONOABS  --  0.9 0.7  EOSABS  --  0.1 0.2  BASOSABS  --  0.2* 0.0    Chemistries   Recent Labs Lab 11/06/15 0420 11/06/15 1224 11/07/15 0516 11/11/15 0547  NA 140  --  140 136  K 2.7* 3.8 3.7 4.1  CL 101  --  103 102  CO2 31  --  30 29  GLUCOSE 153*  --  141* 207*  BUN 23*  --  18 17  CREATININE 0.79  --  0.75 0.76  CALCIUM 8.2*  --  8.2* 8.5*  MG  --  1.7  --   --    ------------------------------------------------------------------------------------------------------------------ estimated creatinine clearance is 91.4 mL/min (by C-G formula based on Cr of 0.76). ------------------------------------------------------------------------------------------------------------------ No results for input(s): HGBA1C in the last 72 hours. ------------------------------------------------------------------------------------------------------------------ No results for input(s): CHOL, HDL, LDLCALC, TRIG, CHOLHDL, LDLDIRECT in the last 72 hours. ------------------------------------------------------------------------------------------------------------------ No results for input(s): TSH, T4TOTAL, T3FREE, THYROIDAB in the last 72  hours.  Invalid input(s): FREET3 ------------------------------------------------------------------------------------------------------------------ No results for input(s): VITAMINB12, FOLATE, FERRITIN, TIBC, IRON, RETICCTPCT in the last 72 hours.  Coagulation profile No results for input(s): INR, PROTIME in the last 168 hours.  No results for input(s): DDIMER in the last 72 hours.  Cardiac Enzymes No results for input(s): CKMB, TROPONINI, MYOGLOBIN in the last 168 hours.  Invalid input(s): CK ------------------------------------------------------------------------------------------------------------------ Invalid input(s): POCBNP    Assessment & Plan    # Right great toe and foot cellulitis with osteomyelitis and MSSA bacteremia Providencia in wound.  Vascular did Angiogram and I& D of right toe on 11/04/15  Podiatry did amputation of first toe 11/07/15 Podiatry to follow on wound in 2 days for discharge Abx with IV Ancef and PO Cipro at discharge. OR again today. Will likely be in the hospital another 2-3 days  # PAD Angiogram on 11/04/2015.   Angioplasty done by vascular.  # Elevated troponin likely from CHF and Infection. No further workup advised by cardiology. ASA, Statin, BB.  # Acute hypoxic respiratory failure secondary to acute on chronic systolic chf - EF 20-25% Changed lasix to PO- stable respiratory status. Monitor daily weights and intake and output Continue aspirin, beta blocker and statin No acute findings.  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 Shaune Pollack has discussed with on-call ophthalmologist Dr. Duke Salvia, who has recommended outpatient follow-up after discharge.  # Diabetes mellitus - improving Sliding scale insulin blood sugars elevated. On glipizide. Added Levemir and increased to 10 units yesterday.  blood sugar under control.  resumed metformin.     Code Status Orders        Start     Ordered    10/30/15 2249  Full code   Continuous     10/30/15 2248    Advance Directive Documentation        Most Recent Value   Type of Advance Directive  Healthcare Power of Attorney   Pre-existing out of facility DNR order (yellow form or pink MOST form)     "MOST" Form in Place?        Consults  Cardiology Podiatry Vascular  D/C 2-3 days  DVT Prophylaxis  heparin  Lab Results  Component  Value Date   PLT 311 11/11/2015    Time Spent in minutes   35 minutes  Milagros Loll R M.D on 11/11/2015 at 12:11 PM  Between 7am to 6pm - Pager - [phone]  After 6pm go to [email or web address] - password [email or web address] [facility]  [facility] [place] [facility]   Office  [phone]

## 2015-11-11 NOTE — Interval H&P Note (Signed)
History and Physical Interval Note:  11/11/2015 1:07 PM  Daniel [name]  has presented today for surgery, with the diagnosis of infected right foot  The various methods of treatment have been discussed with the patient and family. After consideration of risks, benefits and other options for treatment, the patient has consented to  Procedure(s): IRRIGATION AND DEBRIDEMENT FOOT (Right) as a surgical intervention .  The patient's history has been reviewed, patient examined, no change in status, stable for surgery.  I have reviewed the patient's chart and labs.  Questions were answered to the patient's satisfaction.     [name] W.

## 2015-11-11 NOTE — Op Note (Signed)
Date of operation: 11/11/2015.  Surgeon: Ricci Barkerodd W Sham Alviar DPM.  Preoperative diagnosis: Continued abscess and necrosis status post amputation right hallux.  Postoperative diagnosis: Same.  Procedure: Excisional debridement soft tissue and bone right foot amputation site.  Anesthesia: Local Mac.  Hemostasis: None.  Estimated blood loss: Less than 25 cc.  Pathology: None.  Implants: Stimulan rapid cure antibiotic beads impregnated with vancomycin.  Drains: None.  Consultations: None apparent.  Operative indications: This is a 65 year old male with peripheral vascular disease and poorly controlled diabetes recently admitted for an infection in his right great toe. X-rays reveal osteomyelitis. Patient underwent revascularization but subsequently developed gangrenous changes to the great toe. Underwent amputation of the right great toe with partial first ray resection. Postoperatively he continued to have some necrosis mostly along the plantar aspect of the incision area and the decision was made to re-debride the devitalized tissue and remove some additional first metatarsal bone for wound closure.  Operative procedure: Patient was taken to the operating room and placed on the table in the supine position. Following satisfactory sedation the right foot was anesthetized with 10 cc of 0.5% bupivacaine plain. The right foot was then prepped and draped in the usual sterile fashion. Attention was then directed to the right foot at the previous amputation site where the sutures were removed. The wound was then opened up revealing some hematoma and necrosis along the distal and plantar aspect of the incision margins. Excisional debridement was performed of the necrotic areas sharply using a 15 blade including all layers down to the level of bone. The distal aspect of the first metatarsal was exposed and an approximate 1.5 cm segment was transected using a sagittal saw and removed from the toe. Also  sharply excised some of the edges of the previous incision that were also devitalized. The remainder of the wound was then debrided using a versa jet debrider on a setting of 6 and then flushed out with the debrider on a setting of 2-3. Healthy tissues were remaining and then the wound was closed using 4-0 nylon simple interrupted sutures along the proximal and medial most portion of the incision. Antibiotic beads were then placed into the wound containing vancomycin and then the remainder of the wound was sutured together using 4-0 nylon simple interrupted sutures. Xeroform and 4 x 4's followed by ABDs and Kerlix were applied to the right lower extremity followed by an Ace wrap. The patient tolerated procedure and anesthesia well and was transported to the PACU with vital signs stable and in good condition.

## 2015-11-11 NOTE — Anesthesia Preprocedure Evaluation (Addendum)
Anesthesia Evaluation  Patient identified by MRN, date of birth, ID band Patient awake    History of Anesthesia Complications Negative for: history of anesthetic complications  Airway Mallampati: II       Dental no notable dental hx. (+) Edentulous Upper, Edentulous Lower   Pulmonary neg pulmonary ROS,    breath sounds clear to auscultation       Cardiovascular hypertension, + angina + Past MI and + Cardiac Stents   Rhythm:Regular     Neuro/Psych    GI/Hepatic negative GI ROS, Neg liver ROS,   Endo/Other  diabetes, Type 1, Insulin Dependent  Renal/GU negative Renal ROS     Musculoskeletal negative musculoskeletal ROS (+)   Abdominal (+) + obese,   Peds  Hematology  (+) anemia ,   Anesthesia Other Findings   Reproductive/Obstetrics negative OB ROS                            Anesthesia Physical  Anesthesia Plan  ASA: III  Anesthesia Plan: General and MAC   Post-op Pain Management:    Induction: Intravenous  Airway Management Planned: Nasal Cannula  Additional Equipment:   Intra-op Plan:   Post-operative Plan:   Informed Consent: I have reviewed the patients History and Physical, chart, labs and discussed the procedure including the risks, benefits and alternatives for the proposed anesthesia with the patient or authorized representative who has indicated his/her understanding and acceptance.     Plan Discussed with: CRNA, Anesthesiologist and Surgeon  Anesthesia Plan Comments:        Anesthesia Quick Evaluation

## 2015-11-11 NOTE — Progress Notes (Signed)
PT Cancellation Note  Patient Details Name: Glennon Hamiltonllen Selman MRN: [id number] DOB: 09/12/1951   Cancelled Treatment:    Reason Eval/Treat Not Completed: Other (comment). Patient is limited with ambulation distance per podiatry, PT discussed with RN who was currently working with patient. Patient has already been out of bed today and is scheduled for I&D shortly. PT will hold on session given the above and re-attempt after patient has had I&D performed.   Kerin RansomPatrick A McNamara, PT, DPT    11/11/2015, 12:05 PM

## 2015-11-11 NOTE — Care Management Important Message (Signed)
Important Message  Patient Details  Name: Daniel [name] MRN: [id number] Date of Birth: 09/20/1951   Medicare Important Message Given:  Yes    Olegario MessierKathy A Velton Roselle 11/11/2015, 11:40 AM

## 2015-11-11 NOTE — Progress Notes (Signed)
4 Days Post-Op  Subjective: Patient seen. Doing okay.  Objective: Vital signs in last 24 hours: Temp:  [98.3 F (36.8 C)-98.7 F (37.1 C)] 98.5 F (36.9 C) (01/06 0503) Pulse Rate:  [69-71] 69 (01/06 0731) Resp:  [18-24] 20 (01/06 0731) BP: (146-157)/(80-94) 157/94 mmHg (01/06 0731) SpO2:  [96 %-98 %] 97 % (01/06 0731) Weight:  [81.874 kg (180 lb 8 oz)] 81.874 kg (180 lb 8 oz) (01/06 0503) Last BM Date: 11/09/15  Intake/Output from previous day: 01/05 0701 - 01/06 0700 In: 1060 [P.O.:960; IV Piggyback:100] Out: 1625 [Urine:1625] Intake/Output this shift:    Still some moderate bleeding and drainage on the bandage. Upon removal some mild dehiscence of the incision distally. Some progressive necrosis mostly along the plantar flap of skin at the amputation site. Only mild cellulitis which appears to be under control.  Lab Results:   Recent Labs  11/11/15 0547  WBC 11.0*  HGB 12.5*  HCT 37.9*  PLT 311   BMET  Recent Labs  11/11/15 0547  NA 136  K 4.1  CL 102  CO2 29  GLUCOSE 207*  BUN 17  CREATININE 0.76  CALCIUM 8.5*   PT/INR No results for input(s): LABPROT, INR in the last 72 hours. ABG No results for input(s): PHART, HCO3 in the last 72 hours.  Invalid input(s): PCO2, PO2  Studies/Results: No results found.  Anti-infectives: Anti-infectives    Start     Dose/Rate Route Frequency Ordered Stop   11/08/15 2000  ciprofloxacin (CIPRO) tablet 500 mg     500 mg Oral 2 times daily 11/08/15 1432     11/08/15 1445  ceFAZolin (ANCEF) IVPB 2 g/50 mL premix     2 g 100 mL/hr over 30 Minutes Intravenous 3 times per day 11/08/15 1432     11/08/15 1200  sodium chloride 0.9 % 100 mL with ampicillin-sulbactam (UNASYN) 3 g infusion  Status:  Discontinued     100 mL/hr  Intravenous 4 times per day 11/08/15 1000 11/08/15 1432   11/02/15 1800  Ampicillin-Sulbactam (UNASYN) 3 g in sodium chloride 0.9 % 100 mL IVPB  Status:  Discontinued     3 g 100 mL/hr over 60  Minutes Intravenous Every 6 hours 11/02/15 1522 11/08/15 1005   11/02/15 1400  piperacillin-tazobactam (ZOSYN) IVPB 3.375 g  Status:  Discontinued     3.375 g 12.5 mL/hr over 240 Minutes Intravenous 3 times per day 11/02/15 1350 11/02/15 1444   10/31/15 1130  Ampicillin-Sulbactam (UNASYN) 3 g in sodium chloride 0.9 % 100 mL IVPB  Status:  Discontinued     3 g 100 mL/hr over 60 Minutes Intravenous Every 6 hours 10/31/15 1035 11/02/15 1334   10/31/15 0500  vancomycin (VANCOCIN) IVPB 1000 mg/200 mL premix  Status:  Discontinued     1,000 mg 200 mL/hr over 60 Minutes Intravenous Every 12 hours 10/30/15 2248 11/02/15 1333   10/30/15 2130  vancomycin (VANCOCIN) IVPB 1000 mg/200 mL premix  Status:  Discontinued     1,000 mg 200 mL/hr over 60 Minutes Intravenous  Once 10/30/15 2120 10/30/15 2141   10/30/15 1645  piperacillin-tazobactam (ZOSYN) IVPB 3.375 g     3.375 g 100 mL/hr over 30 Minutes Intravenous  Once 10/30/15 1633 10/30/15 2019   10/30/15 1645  vancomycin (VANCOCIN) IVPB 1000 mg/200 mL premix     1,000 mg 200 mL/hr over 60 Minutes Intravenous  Once 10/30/15 1633 10/30/15 2019      Assessment/Plan: s/p Procedure(s): 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 At this point we will try a debridement as opposed to going ahead with a transmetatarsal amputation and allow for more demarcation in the forefoot. Consent form for debridement of the right foot which we performed later on today. Nothing by mouth. Plan for surgery later this afternoon  LOS: 12 days    Daniel [name] W. 11/11/2015

## 2015-11-11 NOTE — H&P (View-Only) (Signed)
4 Days Post-Op  Subjective: Patient seen. Doing okay.  Objective: Vital signs in last 24 hours: Temp:  [98.3 F (36.8 C)-98.7 F (37.1 C)] 98.5 F (36.9 C) (01/06 0503) Pulse Rate:  [69-71] 69 (01/06 0731) Resp:  [18-24] 20 (01/06 0731) BP: (146-157)/(80-94) 157/94 mmHg (01/06 0731) SpO2:  [96 %-98 %] 97 % (01/06 0731) Weight:  [81.874 kg (180 lb 8 oz)] 81.874 kg (180 lb 8 oz) (01/06 0503) Last BM Date: 11/09/15  Intake/Output from previous day: 01/05 0701 - 01/06 0700 In: 1060 [P.O.:960; IV Piggyback:100] Out: 1625 [Urine:1625] Intake/Output this shift:    Still some moderate bleeding and drainage on the bandage. Upon removal some mild dehiscence of the incision distally. Some progressive necrosis mostly along the plantar flap of skin at the amputation site. Only mild cellulitis which appears to be under control.  Lab Results:   Recent Labs  11/11/15 0547  WBC 11.0*  HGB 12.5*  HCT 37.9*  PLT 311   BMET  Recent Labs  11/11/15 0547  NA 136  K 4.1  CL 102  CO2 29  GLUCOSE 207*  BUN 17  CREATININE 0.76  CALCIUM 8.5*   PT/INR No results for input(s): LABPROT, INR in the last 72 hours. ABG No results for input(s): PHART, HCO3 in the last 72 hours.  Invalid input(s): PCO2, PO2  Studies/Results: No results found.  Anti-infectives: Anti-infectives    Start     Dose/Rate Route Frequency Ordered Stop   11/08/15 2000  ciprofloxacin (CIPRO) tablet 500 mg     500 mg Oral 2 times daily 11/08/15 1432     11/08/15 1445  ceFAZolin (ANCEF) IVPB 2 g/50 mL premix     2 g 100 mL/hr over 30 Minutes Intravenous 3 times per day 11/08/15 1432     11/08/15 1200  sodium chloride 0.9 % 100 mL with ampicillin-sulbactam (UNASYN) 3 g infusion  Status:  Discontinued     100 mL/hr  Intravenous 4 times per day 11/08/15 1000 11/08/15 1432   11/02/15 1800  Ampicillin-Sulbactam (UNASYN) 3 g in sodium chloride 0.9 % 100 mL IVPB  Status:  Discontinued     3 g 100 mL/hr over 60  Minutes Intravenous Every 6 hours 11/02/15 1522 11/08/15 1005   11/02/15 1400  piperacillin-tazobactam (ZOSYN) IVPB 3.375 g  Status:  Discontinued     3.375 g 12.5 mL/hr over 240 Minutes Intravenous 3 times per day 11/02/15 1350 11/02/15 1444   10/31/15 1130  Ampicillin-Sulbactam (UNASYN) 3 g in sodium chloride 0.9 % 100 mL IVPB  Status:  Discontinued     3 g 100 mL/hr over 60 Minutes Intravenous Every 6 hours 10/31/15 1035 11/02/15 1334   10/31/15 0500  vancomycin (VANCOCIN) IVPB 1000 mg/200 mL premix  Status:  Discontinued     1,000 mg 200 mL/hr over 60 Minutes Intravenous Every 12 hours 10/30/15 2248 11/02/15 1333   10/30/15 2130  vancomycin (VANCOCIN) IVPB 1000 mg/200 mL premix  Status:  Discontinued     1,000 mg 200 mL/hr over 60 Minutes Intravenous  Once 10/30/15 2120 10/30/15 2141   10/30/15 1645  piperacillin-tazobactam (ZOSYN) IVPB 3.375 g     3.375 g 100 mL/hr over 30 Minutes Intravenous  Once 10/30/15 1633 10/30/15 2019   10/30/15 1645  vancomycin (VANCOCIN) IVPB 1000 mg/200 mL premix     1,000 mg 200 mL/hr over 60 Minutes Intravenous  Once 10/30/15 1633 10/30/15 2019      Assessment/Plan: s/p Procedure(s): 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 At this point we will try a debridement as opposed to going ahead with a transmetatarsal amputation and allow for more demarcation in the forefoot. Consent form for debridement of the right foot which we performed later on today. Nothing by mouth. Plan for surgery later this afternoon  LOS: 12 days    Daniel Beier W. 11/11/2015

## 2015-11-11 NOTE — Progress Notes (Signed)
[place] INFECTIOUS DISEASE PROGRESS NOTE Date of Admission:  10/30/2015     ID: Daniel [name] is a 65 y.o. male with MSSA bacteremia, viridan strep bacteremia (possible contaminant), wound infection.    Active Problems:   Unstable angina (HCC)  Subjective: Underwent amputation 1/2 with Amputation right great toe with partial first ray resection. Some mild throbbing in foot at night.    ROS  Eleven systems are reviewed and negative except per hpi  Medications:  Antibiotics Given (last 72 hours)    Date/Time Action Medication Dose Rate   11/08/15 2105 Given   ceFAZolin (ANCEF) IVPB 2 g/50 mL premix 2 g 100 mL/hr   11/08/15 2105 Given   ciprofloxacin (CIPRO) tablet 500 mg 500 mg    11/09/15 0615 Given   ceFAZolin (ANCEF) IVPB 2 g/50 mL premix 2 g 100 mL/hr   11/09/15 0753 Given   ciprofloxacin (CIPRO) tablet 500 mg 500 mg    11/09/15 1413 Given   ceFAZolin (ANCEF) IVPB 2 g/50 mL premix 2 g 100 mL/hr   11/09/15 1945 Given   ciprofloxacin (CIPRO) tablet 500 mg 500 mg    11/09/15 2206 Given   ceFAZolin (ANCEF) IVPB 2 g/50 mL premix 2 g 100 mL/hr   11/10/15 0600 Given   ceFAZolin (ANCEF) IVPB 2 g/50 mL premix 2 g 100 mL/hr   11/10/15 2979 Given   ciprofloxacin (CIPRO) tablet 500 mg 500 mg    11/10/15 1349 Given   ceFAZolin (ANCEF) IVPB 2 g/50 mL premix 2 g 100 mL/hr   11/10/15 2034 Given   ciprofloxacin (CIPRO) tablet 500 mg 500 mg    11/10/15 2145 Given   ceFAZolin (ANCEF) IVPB 2 g/50 mL premix 2 g 100 mL/hr   11/11/15 8921 Given   ceFAZolin (ANCEF) IVPB 2 g/50 mL premix 2 g 100 mL/hr   11/11/15 0935 Given   ciprofloxacin (CIPRO) tablet 500 mg 500 mg      . amLODipine  5 mg Oral Daily  . aspirin EC  81 mg Oral Daily  . atorvastatin  40 mg Oral QHS  .  ceFAZolin (ANCEF) IV  2 g Intravenous 3 times per day  . ciprofloxacin  500 mg Oral BID  . docusate sodium  100 mg Oral BID  . enoxaparin (LOVENOX) injection  40 mg Subcutaneous Q24H  . furosemide  40 mg Oral  Daily  . glipiZIDE  10 mg Oral BID AC  . insulin aspart  0-9 Units Subcutaneous TID AC & HS  . insulin detemir  10 Units Subcutaneous QHS  . lactulose  30 g Oral Once  . losartan  100 mg Oral Daily  . metFORMIN  500 mg Oral BID WC  . metoprolol tartrate  25 mg Oral BID  . oxyCODONE  10 mg Oral Q12H  . pantoprazole  40 mg Oral QAC breakfast  . polyethylene glycol  17 g Oral Daily  . potassium chloride  40 mEq Oral Daily  . pregabalin  75 mg Oral BID    Objective: Vital signs in last 24 hours: Temp:  [97.4 F (36.3 C)-98.7 F (37.1 C)] 97.4 F (36.3 C) (01/06 1118) Pulse Rate:  [62-71] 62 (01/06 1118) Resp:  [20-24] 20 (01/06 1118) BP: (140-157)/(80-94) 140/82 mmHg (01/06 1118) SpO2:  [96 %-97 %] 97 % (01/06 1118) Weight:  [81.874 kg (180 lb 8 oz)] 81.874 kg (180 lb 8 oz) (01/06 0503) Constitutional: He is oriented to person, place, and time. dishelved HENT: PERRLA, EOMI, anicteric Mouth/Throat: Oropharynx is  clear and moist. No oropharyngeal exudate.  Cardiovascular: Normal rate, regular rhythm and normal heart sounds.  Pulmonary/Chest: Effort normal and breath sounds normal. No respiratory distress. He has no wheezes.  Abdominal: Soft. Bowel sounds are normal. He exhibits no distension. There is no tenderness.  Lymphadenopathy: He has no cervical adenopathy.  Neurological: He is alert and oriented to person, place, and time.  Skin: R foot wrapped post op Psychiatric: He has a normal mood and affect. His behavior is normal.   Lab Results  Recent Labs  11/11/15 0547  WBC 11.0*  HGB 12.5*  HCT 37.9*  NA 136  K 4.1  CL 102  CO2 29  BUN 17  CREATININE 0.76    Microbiology: Results for orders placed or performed during the hospital encounter of 10/30/15  Culture, blood (routine x 2)     Status: None   Collection Time: 10/30/15  4:44 PM  Result Value Ref Range Status   Specimen Description BLOOD LEFT WRIST  Final   Special Requests BOTTLES DRAWN AEROBIC AND  ANAEROBIC  2CC  Final   Culture  Setup Time   Final    GRAM POSITIVE COCCI IN CLUSTERS AEROBIC BOTTLE ONLY CRITICAL RESULT CALLED TO, READ BACK BY AND VERIFIED WITH: JASON ROBBINS AT 5093 ON 10/31/15 CTJ    Culture STAPHYLOCOCCUS AUREUS AEROBIC BOTTLE ONLY   Final   Report Status 11/04/2015 FINAL  Final   Organism ID, Bacteria STAPHYLOCOCCUS AUREUS  Final      Susceptibility   Staphylococcus aureus - MIC*    CIPROFLOXACIN <=0.5 SENSITIVE Sensitive     ERYTHROMYCIN 0.5 SENSITIVE Sensitive     GENTAMICIN <=0.5 SENSITIVE Sensitive     OXACILLIN <=0.25 SENSITIVE Sensitive     TRIMETH/SULFA <=10 SENSITIVE Sensitive     CLINDAMYCIN <=0.25 SENSITIVE Sensitive     CEFOXITIN SCREEN NEGATIVE Sensitive     Inducible Clindamycin NEGATIVE Sensitive     TETRACYCLINE Value in next row Sensitive      SENSITIVE<=1    * STAPHYLOCOCCUS AUREUS  Blood Culture ID Panel (Reflexed)     Status: Abnormal   Collection Time: 10/30/15  4:44 PM  Result Value Ref Range Status   Enterococcus species NOT DETECTED NOT DETECTED Final   Listeria monocytogenes NOT DETECTED NOT DETECTED Final   Staphylococcus species DETECTED (A) NOT DETECTED Corrected    Comment: CORRECTED ON 12/27 AT 0801: PREVIOUSLY REPORTED AS NOT DETECTED   Staphylococcus aureus DETECTED (A) NOT DETECTED Final    Comment: CRITICAL RESULT CALLED TO, READ BACK BY AND VERIFIED WITH: JASON ROBBINS AT 1710 ON 10/31/15 CTJ    Streptococcus species NOT DETECTED NOT DETECTED Final   Streptococcus agalactiae NOT DETECTED NOT DETECTED Final   Streptococcus pneumoniae NOT DETECTED NOT DETECTED Final   Streptococcus pyogenes NOT DETECTED NOT DETECTED Final   Acinetobacter baumannii NOT DETECTED NOT DETECTED Final   Enterobacteriaceae species NOT DETECTED NOT DETECTED Final   Enterobacter cloacae complex NOT DETECTED NOT DETECTED Final   Escherichia coli NOT DETECTED NOT DETECTED Final   Klebsiella oxytoca NOT DETECTED NOT DETECTED Final   Klebsiella  pneumoniae NOT DETECTED NOT DETECTED Final   Proteus species NOT DETECTED NOT DETECTED Final   Serratia marcescens NOT DETECTED NOT DETECTED Final   Haemophilus influenzae NOT DETECTED NOT DETECTED Final   Neisseria meningitidis NOT DETECTED NOT DETECTED Final   Pseudomonas aeruginosa NOT DETECTED NOT DETECTED Final   Candida albicans NOT DETECTED NOT DETECTED Final   Candida glabrata NOT DETECTED NOT  DETECTED Final   Candida krusei NOT DETECTED NOT DETECTED Final   Candida parapsilosis NOT DETECTED NOT DETECTED Final   Candida tropicalis NOT DETECTED NOT DETECTED Final   Carbapenem resistance NOT DETECTED NOT DETECTED Final   Methicillin resistance NOT DETECTED NOT DETECTED Final   Vancomycin resistance NOT DETECTED NOT DETECTED Final  Culture, blood (routine x 2)     Status: None   Collection Time: 10/30/15  5:31 PM  Result Value Ref Range Status   Specimen Description BLOOD LEFT HAND  Final   Special Requests BOTTLES DRAWN AEROBIC AND ANAEROBIC  2CC  Final   Culture  Setup Time   Final    GRAM POSITIVE COCCI IN CHAINS IN BOTH AEROBIC AND ANAEROBIC BOTTLES CRITICAL RESULT CALLED TO, READ BACK BY AND VERIFIED WITH: KAREN HAYES AT 1583 10/31/15 CTJ    Culture   Final    VIRIDANS STREPTOCOCCUS IN BOTH AEROBIC AND ANAEROBIC BOTTLES MULTIPLE SPECIES PRESENT Results consistent with contamination.    Report Status 11/02/2015 FINAL  Final  Blood Culture ID Panel (Reflexed)     Status: Abnormal   Collection Time: 10/30/15  5:31 PM  Result Value Ref Range Status   Enterococcus species NOT DETECTED NOT DETECTED Final   Listeria monocytogenes NOT DETECTED NOT DETECTED Final   Staphylococcus species NOT DETECTED NOT DETECTED Final   Staphylococcus aureus NOT DETECTED NOT DETECTED Final   Streptococcus species DETECTED (A) NOT DETECTED Final    Comment: CRITICAL RESULT CALLED TO, READ BACK BY AND VERIFIED WITH: KAREN HAYES AT 1045 ON 10/31/15 CTJ    Streptococcus agalactiae NOT DETECTED  NOT DETECTED Final   Streptococcus pneumoniae NOT DETECTED NOT DETECTED Final   Streptococcus pyogenes NOT DETECTED NOT DETECTED Final   Acinetobacter baumannii NOT DETECTED NOT DETECTED Final   Enterobacteriaceae species NOT DETECTED NOT DETECTED Final   Enterobacter cloacae complex NOT DETECTED NOT DETECTED Final   Escherichia coli NOT DETECTED NOT DETECTED Final   Klebsiella oxytoca NOT DETECTED NOT DETECTED Final   Klebsiella pneumoniae NOT DETECTED NOT DETECTED Final   Proteus species NOT DETECTED NOT DETECTED Final   Serratia marcescens NOT DETECTED NOT DETECTED Final   Haemophilus influenzae NOT DETECTED NOT DETECTED Final   Neisseria meningitidis NOT DETECTED NOT DETECTED Final   Pseudomonas aeruginosa NOT DETECTED NOT DETECTED Final   Candida albicans NOT DETECTED NOT DETECTED Final   Candida glabrata NOT DETECTED NOT DETECTED Final   Candida krusei NOT DETECTED NOT DETECTED Final   Candida parapsilosis NOT DETECTED NOT DETECTED Final   Candida tropicalis NOT DETECTED NOT DETECTED Final   Carbapenem resistance NOT DETECTED NOT DETECTED Final   Methicillin resistance NOT DETECTED NOT DETECTED Final   Vancomycin resistance NOT DETECTED NOT DETECTED Final  CULTURE, BLOOD (ROUTINE X 2) w Reflex to PCR ID Panel     Status: None   Collection Time: 10/31/15 11:35 AM  Result Value Ref Range Status   Specimen Description BLOOD RIGHT HAND  Final   Special Requests BOTTLES DRAWN AEROBIC AND ANAEROBIC 2CC  Final   Culture NO GROWTH 5 DAYS  Final   Report Status 11/05/2015 FINAL  Final  CULTURE, BLOOD (ROUTINE X 2) w Reflex to PCR ID Panel     Status: None   Collection Time: 10/31/15 11:35 AM  Result Value Ref Range Status   Specimen Description BLOOD RIGHT ASSIST CONTROL  Final   Special Requests BOTTLES DRAWN AEROBIC AND ANAEROBIC 2CC  Final   Culture NO GROWTH 5 DAYS  Final   Report Status 11/05/2015 FINAL  Final  Wound culture     Status: None   Collection Time: 10/31/15  2:12 PM   Result Value Ref Range Status   Specimen Description ULCER  Final   Special Requests NONE  Final   Gram Stain   Final    RARE WBC SEEN MODERATE GRAM POSITIVE COCCI IN PAIRS IN CLUSTERS RARE GRAM NEGATIVE RODS RARE GRAM POSITIVE RODS    Culture   Final    MODERATE GROWTH PROVIDENCIA RETTGERI HEAVY GROWTH STAPHYLOCOCCUS AUREUS    Report Status 11/03/2015 FINAL  Final   Organism ID, Bacteria PROVIDENCIA RETTGERI  Final   Organism ID, Bacteria STAPHYLOCOCCUS AUREUS  Final      Susceptibility   Staphylococcus aureus - MIC*    CIPROFLOXACIN <=0.5 SENSITIVE Sensitive     ERYTHROMYCIN <=0.25 SENSITIVE Sensitive     GENTAMICIN <=0.5 SENSITIVE Sensitive     OXACILLIN <=0.25 SENSITIVE Sensitive     TRIMETH/SULFA <=10 SENSITIVE Sensitive     CLINDAMYCIN <=0.25 SENSITIVE Sensitive     CEFOXITIN SCREEN NEGATIVE Sensitive     Inducible Clindamycin NEGATIVE Sensitive     TETRACYCLINE Value in next row Sensitive      SENSITIVE<=1    * HEAVY GROWTH STAPHYLOCOCCUS AUREUS   Providencia rettgeri - MIC*    AMPICILLIN Value in next row Resistant      SENSITIVE<=1    CEFTAZIDIME Value in next row Sensitive      SENSITIVE<=1    CEFAZOLIN Value in next row Resistant      SENSITIVE<=1    CEFTRIAXONE Value in next row Sensitive      SENSITIVE<=1    CIPROFLOXACIN Value in next row Sensitive      SENSITIVE<=1    GENTAMICIN Value in next row Sensitive      SENSITIVE<=1    IMIPENEM Value in next row Sensitive      SENSITIVE<=1    TRIMETH/SULFA Value in next row Resistant      SENSITIVE<=1    PIP/TAZO Value in next row Sensitive      SENSITIVE<=4    AMPICILLIN/SULBACTAM Value in next row Sensitive      SENSITIVE<=2    * MODERATE GROWTH PROVIDENCIA RETTGERI  Culture, blood (Routine X 2) w Reflex to ID Panel     Status: None   Collection Time: 11/01/15  5:07 PM  Result Value Ref Range Status   Specimen Description BLOOD RIGHT ASSIST CONTROL  Final   Special Requests BOTTLES DRAWN AEROBIC AND  ANAEROBIC 5CC  Final   Culture NO GROWTH 5 DAYS  Final   Report Status 11/06/2015 FINAL  Final  Culture, blood (Routine X 2) w Reflex to ID Panel     Status: None   Collection Time: 11/01/15  5:07 PM  Result Value Ref Range Status   Specimen Description BLOOD RIGHT HAND  Final   Special Requests BOTTLES DRAWN AEROBIC AND ANAEROBIC 3CC  Final   Culture NO GROWTH 5 DAYS  Final   Report Status 11/06/2015 FINAL  Final  Anaerobic culture     Status: None   Collection Time: 11/04/15  7:20 PM  Result Value Ref Range Status   Specimen Description TOE RIGHT GREAT TOE  Final   Special Requests NONE  Final   Culture NO ANAEROBES ISOLATED  Final   Report Status 11/08/2015 FINAL  Final  Wound culture     Status: None   Collection Time: 11/04/15  7:20 PM  Result Value  Ref Range Status   Specimen Description WOUND RIGHT GREAT TOE  Final   Special Requests NONE  Final   Gram Stain RARE WBC SEEN NO ORGANISMS SEEN   Final   Culture NO GROWTH 3 DAYS  Final   Report Status 11/07/2015 FINAL  Final  Anaerobic culture     Status: None (Preliminary result)   Collection Time: 11/07/15  8:46 AM  Result Value Ref Range Status   Specimen Description WOUND  Final   Special Requests NONE  Final   Culture   Final    NO ANAEROBES ISOLATED; CULTURE IN PROGRESS FOR 5 DAYS   Report Status PENDING  Incomplete  Wound culture     Status: None   Collection Time: 11/07/15  8:46 AM  Result Value Ref Range Status   Specimen Description WOUND  Final   Special Requests NONE  Final   Gram Stain   Final    RARE WBC SEEN FEW RED BLOOD CELLS RARE GRAM NEGATIVE RODS    Culture   Final    MODERATE GROWTH PROVIDENCIA RETTGERI LIGHT GROWTH STAPHYLOCOCCUS AUREUS    Report Status 11/10/2015 FINAL  Final   Organism ID, Bacteria PROVIDENCIA RETTGERI  Final   Organism ID, Bacteria STAPHYLOCOCCUS AUREUS  Final      Susceptibility   Staphylococcus aureus - MIC*    CIPROFLOXACIN <=0.5 SENSITIVE Sensitive     ERYTHROMYCIN  0.5 SENSITIVE Sensitive     GENTAMICIN <=0.5 SENSITIVE Sensitive     OXACILLIN <=0.25 SENSITIVE Sensitive     TRIMETH/SULFA <=10 SENSITIVE Sensitive     CLINDAMYCIN <=0.25 SENSITIVE Sensitive     CEFOXITIN SCREEN NEGATIVE Sensitive     Inducible Clindamycin NEGATIVE Sensitive     TETRACYCLINE Value in next row Sensitive      SENSITIVE<=1    * LIGHT GROWTH STAPHYLOCOCCUS AUREUS   Providencia rettgeri - MIC*    AMPICILLIN Value in next row Resistant      SENSITIVE<=1    CEFTAZIDIME Value in next row Sensitive      SENSITIVE<=1    CEFAZOLIN Value in next row Resistant      SENSITIVE<=1    CEFTRIAXONE Value in next row Sensitive      SENSITIVE<=1    CIPROFLOXACIN Value in next row Sensitive      SENSITIVE<=1    GENTAMICIN Value in next row Sensitive      SENSITIVE<=1    IMIPENEM Value in next row Sensitive      SENSITIVE<=1    TRIMETH/SULFA Value in next row Resistant      SENSITIVE<=1    PIP/TAZO Value in next row Sensitive      SENSITIVE<=4    * MODERATE GROWTH PROVIDENCIA RETTGERI    Studies/Results: No results found. ECHO  - Left ventricle: The cavity size was severely dilated. Systolic function was severely reduced. The estimated ejection fraction was in the range of 20% to 25%. Diffuse hypokinesis. - Aortic valve: Valve area (Vmax): 2.05 cm^2. - Mitral valve: There was mild regurgitation. - Left atrium: The atrium was mildly dilated. - Right atrium: The atrium was mildly dilated.  Assessment/Plan:  Daniel [name] is a 65 y.o. 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 Wound cx with heavy growth staph aureus and mod growth Providenceia. A1c 12.2. Vascular eval s/p angioplasty 12/30 . Denies smoking.  ESR 18, CRP 24.9. HIV NR.  FU  [place] 12/27 NGTD.  Tissue cx 1/2 with GNR pendign Echo with EF 20-25%, no vegetations  noted. PICC placed Wound with some necrosis on edges per podiatry - for surgery today   Recommendations Will need at least 2 weeks IV abx for the S aureus bacteremia - Will likely need longer therapy depending on the most recent surgery He will eventually be discharged to SNF  Will cont ancef 2 gm q 8 IV and oral cipro (for the providencia) See abx order sheet from 1/3  Thank you very much for the consult. Will follow with you.  [place], [place]   11/11/2015, 3:28 PM

## 2015-11-11 NOTE — Transfer of Care (Signed)
Immediate Anesthesia Transfer of Care Note  Patient: Daniel [name]  Procedure(s) Performed: Procedure(s): IRRIGATION AND DEBRIDEMENT FOOT (Right)  Patient Location: PACU  Anesthesia Type:MAC  Level of Consciousness: awake  Airway & Oxygen Therapy: Patient Spontanous Breathing and Patient connected to nasal cannula oxygen  Post-op Assessment: Report given to RN and Post -op Vital signs reviewed and stable  Post vital signs: Reviewed and stable  Last Vitals:  Filed Vitals:   11/11/15 0731 11/11/15 1118  BP: 157/94 140/82  Pulse: 69 62  Temp:  36.3 C  Resp: 20 20    Complications: No apparent anesthesia complications

## 2015-11-12 LAB — CBC WITH DIFFERENTIAL/PLATELET
BASOS ABS: 0.1 10*3/uL (ref 0–0.1)
BASOS PCT: 1 %
EOS ABS: 0.1 10*3/uL (ref 0–0.7)
Eosinophils Relative: 1 %
HCT: 37.4 % — ABNORMAL LOW (ref 40.0–52.0)
HEMOGLOBIN: 12.3 g/dL — AB (ref 13.0–18.0)
LYMPHS ABS: 1.4 10*3/uL (ref 1.0–3.6)
Lymphocytes Relative: 12 %
MCH: 29.2 pg (ref 26.0–34.0)
MCHC: 33 g/dL (ref 32.0–36.0)
MCV: 88.6 fL (ref 80.0–100.0)
Monocytes Absolute: 0.9 10*3/uL (ref 0.2–1.0)
Monocytes Relative: 8 %
NEUTROS PCT: 78 %
Neutro Abs: 8.9 10*3/uL — ABNORMAL HIGH (ref 1.4–6.5)
PLATELETS: 313 10*3/uL (ref 150–440)
RBC: 4.22 MIL/uL — AB (ref 4.40–5.90)
RDW: 14.9 % — ABNORMAL HIGH (ref 11.5–14.5)
WBC: 11.3 10*3/uL — AB (ref 3.8–10.6)

## 2015-11-12 LAB — GLUCOSE, CAPILLARY
GLUCOSE-CAPILLARY: 131 mg/dL — AB (ref 65–99)
GLUCOSE-CAPILLARY: 153 mg/dL — AB (ref 65–99)
GLUCOSE-CAPILLARY: 182 mg/dL — AB (ref 65–99)
Glucose-Capillary: 198 mg/dL — ABNORMAL HIGH (ref 65–99)
Glucose-Capillary: 185 mg/dL — ABNORMAL HIGH (ref 65–99)

## 2015-11-12 NOTE — Clinical Documentation Improvement (Signed)
[facility] Medicine at [facility]  Query responses must be documented in the progress notes and discharge summary. Responses documented on the CDI BPA cannot be coded without documentation in the actual medical record.)  Per coding guidelines, pressure ulcer information must be documented by the attending physician.  If you agree with the WOC nurse's consult note, please document the Location, Stage and POA of the pressure ulcer in the progress notes and discharge summary.  WOC Nurse consult 10/31/15 by Alphonzo DublinKaren A Sanders RN "Stage 2 pressure injury to right buttock, pressure and moisture contributing.  Wound type:Infectious  Pressure Ulcer POA: Yes Stage 2 to right buttocks near ischium  Measurement: right buttocks 1 cm x 1 cm x 1 0.1 cm pink denuded lesion. Will protect with silicone border foam dressing."  Please exercise your independent, professional judgment when responding.  A specific answer is not anticipated or expected.   Thank You, Jerral Ralphathy R Vittoria Noreen  RN BSN CCDS [phone] Health Information Management [place]

## 2015-11-12 NOTE — Progress Notes (Signed)
[facility] [place] [facility]   PATIENT NAME: Daniel Hamiltonllen [name]    MR#:  [id number]  DATE OF BIRTH:  08/30/1951  SUBJECTIVE:  Patient underwent excisional debridement of soft tissue right foot amputation site Pain controlled  REVIEW OF SYSTEMS:    Review of Systems  Constitutional: Negative for fever, chills and malaise/fatigue.  HENT: Negative for sore throat.   Eyes: Negative for blurred vision.  Respiratory: Negative for cough, hemoptysis, shortness of breath and wheezing.   Cardiovascular: Negative for chest pain, palpitations and leg swelling.  Gastrointestinal: Negative for nausea, vomiting, abdominal pain, diarrhea and blood in stool.  Genitourinary: Negative for dysuria.  Musculoskeletal: Negative for back pain.  Neurological: Negative for dizziness, tremors and headaches.  Endo/Heme/Allergies: Does not bruise/bleed easily.    Tolerating Diet:yes      DRUG ALLERGIES:   Allergies  Allergen Reactions  . Dilaudid [Hydromorphone] Other (See Comments)    Agitation    VITALS:  Blood pressure 126/68, pulse 69, temperature 98.1 F (36.7 C), temperature source Oral, resp. rate 18, height 5\' 8"  (1.727 m), weight 82.509 kg (181 lb 14.4 oz), SpO2 100 %.  PHYSICAL EXAMINATION:   Physical Exam    LABORATORY PANEL:   CBC  Recent Labs Lab 11/12/15 0427  WBC 11.3*  HGB 12.3*  HCT 37.4*  PLT 313   ------------------------------------------------------------------------------------------------------------------  Chemistries   Recent Labs Lab 11/06/15 1224  11/11/15 0547  NA  --   < > 136  K 3.8  < > 4.1  CL  --   < > 102  CO2  --   < > 29  GLUCOSE  --   < > 207*  BUN  --   < > 17  CREATININE  --   < > 0.76  CALCIUM  --   < > 8.5*  MG 1.7  --   --   < > = values in this interval not displayed. ------------------------------------------------------------------------------------------------------------------  Cardiac  Enzymes No results for input(s): TROPONINI in the last 168 hours. ------------------------------------------------------------------------------------------------------------------  RADIOLOGY:  No results found.   ASSESSMENT AND PLAN:   65 year old male with history of MSSA and VIRIDAN bacteremia from diabetic wound infection with underlying osteomyelitis.  1. Right great toe and foot cellulitis with osteo-myelitis and MSSA bacteremia and Providencia from wound. Patient is status post angiogram and I&D of right toe on December 30. He then had amputation of first toe on January 2. On January 6 he underwent excisional debridement of the soft tissue at the site of the amputation. Wound culture is growing out PROVEDENCIA and Staphylococcus aureus. Blood cultures were also positive for MSSA. Per Dr. Sampson GoonFitzgerald recommendations patient will need at least 2 weeks of IV antibiotics for the bacteremia. He may need longer depending on the most recent surgery. Patient will continue on Ancef 2 g IV every 8 hours as well as ciprofloxacin for the Provedencia.  2. Peripheral arterial disease: Patient status post angioplasty on 11/04/2015.  3. Elevated troponin: This is demand ischemia from CHF and current infection. No further work up as advised by cardiology.  4. Acute hypoxic respiratory failure: This was due to acute on chronic systolic heart failure. Ejection fraction of 20-25%. Patient was changed to by mouth Lasix and his respiratory status is now stable. Patient will continue on aspirin, beta blocker and statin.  5. 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 Patient will need outpatient follow-up. Case was discussed by the ER physician to the  on-call ophthalmologist on admission.  6. Diabetes: Continue glipizide, sliding scale insulin, Levemir.    Management plans discussed with the patient and he is in agreement.  CODE STATUS: FULL  TOTAL TIME TAKING CARE OF THIS  PATIENT: 30 minutes.     POSSIBLE D/C 2-3 days, DEPENDING ON CLINICAL CONDITION to skilled nursing facility   Koven Belinsky M.D on 11/12/2015 at 2:34 PM  Between 7am to 6pm - Pager - [phone] After 6pm go to [email or web address] - password [email or web address] [facility]  [name]  Office  [phone]  CC: Primary care physician; Marguarite Arbour, MD  Note: This dictation was prepared with Dragon dictation along with smaller phrase technology. Any transcriptional errors that result from this process are unintentional.

## 2015-11-12 NOTE — Progress Notes (Signed)
Patient transferring from 2A 232 to 1A 141. Belongings packed and are being transported with patient. Brother, Windy FastRonald called and updated with new room #. Report called to April, 1A RN. Pt to eat lunch then will be transported.

## 2015-11-12 NOTE — Progress Notes (Signed)
FSBS taken at bedside with aide and nurse. Glucometer reading of 153. Reading would not transfer after numerous attempts. Lab being notified of reading not transferring. Treated for 153.

## 2015-11-12 NOTE — Progress Notes (Signed)
Patient Demographics  Daniel [name], is a 65 y.o. male   MRN: [id number]   DOB - 1951-08-03  Admit Date - 10/30/2015    Outpatient Primary MD for the patient is [name],[name], MD  Consult requested in the Hospital by Adrian Saran, MD, On 11/12/2015    With History of -  Past Medical History  Diagnosis Date  . Diabetes mellitus without complication (HCC)   . Stented coronary artery   . Myocardial infarct (HCC)   . Hypertension   . Coronary artery disease       Past Surgical History  Procedure Laterality Date  . Wound debridement Right 11/04/2015    Procedure: DEBRIDEMENT WOUND;  Surgeon: [name], MD;  Location: [facility] ORS;  Service: Vascular;  Laterality: Right;  . Peripheral vascular catheterization N/A 11/04/2015    Procedure: Lower Extremity Angiography;  Surgeon: [name], MD;  Location: [facility] INVASIVE CV LAB;  Service: Cardiovascular;  Laterality: N/A;  . Peripheral vascular catheterization N/A 11/04/2015    Procedure: Lower Extremity Intervention;  Surgeon: [name], MD;  Location: [facility] INVASIVE CV LAB;  Service: Cardiovascular;  Laterality: N/A;  . Amputation toe Right 11/07/2015    Procedure: AMPUTATION TOE and 1st ray amputation;  Surgeon: Linus Galas, MD;  Location: [facility] ORS;  Service: Podiatry;  Laterality: Right;    in for   Chief Complaint  Patient presents with  . Weakness  . Hyperglycemia     HPI  Daniel [name]  is a 65 y.o. male, underwent surgery yesterday late afternoon for removal of infected soft tissue and bone from the right foot. This was performed by Dr. Linus Galas. He is currently on bed rest. He states he feels pretty comfortable with his head fairly minimal pain.    Review of Systems    In addition to the HPI above,  No Fever-chills, No  Headache, No changes with Vision or hearing, No problems swallowing food or Liquids, No Chest pain, Cough or Shortness of Breath, No Abdominal pain, No Nausea or Vommitting, Bowel movements are regular, No Blood in stool or Urine, No dysuria, No new skin rashes or bruises, No new joints pains-aches,  No new weakness, tingling, numbness in any extremity, No recent weight gain or loss, No polyuria, polydypsia or polyphagia, No significant Mental Stressors.  A full 10 point Review of Systems was done, except as stated above, all other Review of Systems were negative.   Social History Social History  Substance Use Topics  . Smoking status: Never Smoker   . Smokeless tobacco: Not on file  . Alcohol Use: No     Prior to Admission medications   Medication Sig Start Date End Date Taking? Authorizing Provider  aspirin EC 81 MG tablet Take 81 mg by mouth daily.   Yes Historical Provider, MD  atorvastatin (LIPITOR) 10 MG tablet Take 10 mg by mouth at bedtime.   Yes Historical Provider, MD  furosemide (LASIX) 40 MG tablet Take 40 mg by mouth daily.  Yes Historical Provider, MD  gabapentin (NEURONTIN) 600 MG tablet Take 600 mg by mouth 3 (three) times daily.   Yes Historical Provider, MD  glipiZIDE (GLUCOTROL) 10 MG tablet Take 10 mg by mouth 2 (two) times daily before a meal.   Yes Historical Provider, MD  losartan (COZAAR) 100 MG tablet Take 100 mg by mouth daily.   Yes Historical Provider, MD  metFORMIN (GLUCOPHAGE) 500 MG tablet Take 1,000 mg by mouth 2 (two) times daily.   Yes Historical Provider, MD  omeprazole (PRILOSEC) 20 MG capsule Take 20 mg by mouth daily.   Yes Historical Provider, MD  pioglitazone (ACTOS) 30 MG tablet Take 30 mg by mouth daily.   Yes Historical Provider, MD  pregabalin (LYRICA) 75 MG capsule Take 75 mg by mouth 2 (two) times daily.   Yes Historical Provider, MD    Anti-infectives    Start     Dose/Rate Route Frequency Ordered Stop   11/11/15 1915   vancomycin [facility](VANCOCIN) powder  Status:  Discontinued       As needed 11/11/15 1915 11/11/15 1928   11/08/15 2000  ciprofloxacin (CIPRO) tablet 500 mg     500 mg Oral 2 times daily 11/08/15 1432     11/08/15 1445  ceFAZolin (ANCEF) IVPB 2 g/50 mL premix     2 g 100 mL/hr over 30 Minutes Intravenous 3 times per day 11/08/15 1432     11/08/15 1200  sodium chloride 0.9 % 100 mL with ampicillin-sulbactam (UNASYN) 3 g infusion  Status:  Discontinued     100 mL/hr  Intravenous 4 times per day 11/08/15 1000 11/08/15 1432   11/02/15 1800  Ampicillin-Sulbactam (UNASYN) 3 g in sodium chloride 0.9 % 100 mL IVPB  Status:  Discontinued     3 g 100 mL/hr over 60 Minutes Intravenous Every 6 hours 11/02/15 1522 11/08/15 1005   11/02/15 1400  piperacillin-tazobactam (ZOSYN) IVPB 3.375 g  Status:  Discontinued     3.375 g 12.5 mL/hr over 240 Minutes Intravenous 3 times per day 11/02/15 1350 11/02/15 1444   10/31/15 1130  Ampicillin-Sulbactam (UNASYN) 3 g in sodium chloride 0.9 % 100 mL IVPB  Status:  Discontinued     3 g 100 mL/hr over 60 Minutes Intravenous Every 6 hours 10/31/15 1035 11/02/15 1334   10/31/15 0500  vancomycin (VANCOCIN) IVPB 1000 mg/200 mL premix  Status:  Discontinued     1,000 mg 200 mL/hr over 60 Minutes Intravenous Every 12 hours 10/30/15 2248 11/02/15 1333   10/30/15 2130  vancomycin (VANCOCIN) IVPB 1000 mg/200 mL premix  Status:  Discontinued     1,000 mg 200 mL/hr over 60 Minutes Intravenous  Once 10/30/15 2120 10/30/15 2141   10/30/15 1645  piperacillin-tazobactam (ZOSYN) IVPB 3.375 g     3.375 g 100 mL/hr over 30 Minutes Intravenous  Once 10/30/15 1633 10/30/15 2019   10/30/15 1645  vancomycin (VANCOCIN) IVPB 1000 mg/200 mL premix     1,000 mg 200 mL/hr over 60 Minutes Intravenous  Once 10/30/15 1633 10/30/15 2019      Scheduled Meds: . amLODipine  5 mg Oral Daily  . aspirin EC  81 mg Oral Daily  . atorvastatin  40 mg Oral QHS  .  ceFAZolin (ANCEF) IV  2 g Intravenous 3  times per day  . ciprofloxacin  500 mg Oral BID  . docusate sodium  100 mg Oral BID  . enoxaparin (LOVENOX) injection  40 mg Subcutaneous Q24H  . furosemide  40 mg  Oral Daily  . glipiZIDE  10 mg Oral BID AC  . insulin aspart  0-9 Units Subcutaneous TID AC & HS  . insulin detemir  10 Units Subcutaneous QHS  . lactulose  30 g Oral Once  . losartan  100 mg Oral Daily  . metFORMIN  500 mg Oral BID WC  . metoprolol tartrate  25 mg Oral BID  . oxyCODONE  10 mg Oral Q12H  . pantoprazole  40 mg Oral QAC breakfast  . polyethylene glycol  17 g Oral Daily  . potassium chloride  40 mEq Oral Daily  . pregabalin  75 mg Oral BID  . sodium chloride  3 mL Intravenous Q12H   Continuous Infusions:  PRN Meds:.sodium chloride, sodium chloride, acetaminophen, morphine injection, nitroGLYCERIN, ondansetron (ZOFRAN) IV, oxyCODONE, sodium chloride, sodium chloride  Allergies  Allergen Reactions  . Dilaudid [Hydromorphone] Other (See Comments)    Agitation    Physical Exam: Dressing is dry clean and intact. Patient is nonweightbearing on bed rest this point. An afebrile slight elevation of white count but also the red blood cells and hemoglobin.  Vitals  Blood pressure 121/63, pulse 71, temperature 98.6 F (37 C), temperature source Oral, resp. rate 24, height 5\' 8"  (1.727 m), weight 82.509 kg (181 lb 14.4 oz), SpO2 92 %.  Data Review  CBC  Recent Labs Lab 11/06/15 0420 11/08/15 0425 11/11/15 0547 11/12/15 0427  WBC 8.6 11.8* 11.0* 11.3*  HGB 11.5* 11.6* 12.5* 12.3*  HCT 34.5* 35.4* 37.9* 37.4*  PLT 249 297 311 313  MCV 87.8 87.6 86.8 88.6  MCH 29.3 28.7 28.6 29.2  MCHC 33.4 32.7 33.0 33.0  RDW 14.5 14.8* 15.1* 14.9*  LYMPHSABS  --  1.8 1.7 1.4  MONOABS  --  0.9 0.7 0.9  EOSABS  --  0.1 0.2 0.1  BASOSABS  --  0.2* 0.0 0.1   ------------------------------------------------------------------------------------------------------------------  Chemistries   Recent Labs Lab  11/06/15 0420 11/06/15 1224 11/07/15 0516 11/11/15 0547  NA 140  --  140 136  K 2.7* 3.8 3.7 4.1  CL 101  --  103 102  CO2 31  --  30 29  GLUCOSE 153*  --  141* 207*  BUN 23*  --  18 17  CREATININE 0.79  --  0.75 0.76  CALCIUM 8.2*  --  8.2* 8.5*  MG  --  1.7  --   --    ------------------------------------------------------------------------------------------------------------------ estimated creatinine clearance is 98.9 mL/min (by C-G formula based on Cr of 0.76). ------------------------------------------------------------------------------------------------------------------ No results for input(s): TSH, T4TOTAL, T3FREE, THYROIDAB in the last 72 hours.  Invalid input(s): FREET3   Coagulation profile No results for input(s): INR, PROTIME in the last 168 hours. ------------------------------------------------------------------------------------------------------------------- No results for input(s): DDIMER in the last 72 hours. -------------------------------------------------------------------------------------------------------------------  Cardiac Enzymes No results for input(s): CKMB, TROPONINI, MYOGLOBIN in the last 168 hours.  Invalid input(s): CK ------------------------------------------------------------------------------------------------------------------ Invalid input(s): POCBNP   ---------------------------------------------------------------------------------------------------------------  Urinalysis    Component Value Date/Time   COLORURINE STRAW* 10/30/2015 1541   COLORURINE Yellow 04/23/2012 1940   APPEARANCEUR CLEAR* 10/30/2015 1541   APPEARANCEUR Hazy 04/23/2012 1940   LABSPEC 1.012 10/30/2015 1541   LABSPEC 1.020 04/23/2012 1940   PHURINE 5.0 10/30/2015 1541   PHURINE 5.0 04/23/2012 1940   GLUCOSEU >500* 10/30/2015 1541   GLUCOSEU 150 mg/dL 78/29/5621 3086   HGBUR 2+* 10/30/2015 1541   HGBUR Negative 04/23/2012 1940   BILIRUBINUR NEGATIVE  10/30/2015 1541   BILIRUBINUR Negative 04/23/2012 1940   KETONESUR 1+*  10/30/2015 1541   KETONESUR Negative 04/23/2012 1940   PROTEINUR 100* 10/30/2015 1541   PROTEINUR 30 mg/dL 16/08/9603 5409   NITRITE NEGATIVE 10/30/2015 1541   NITRITE Negative 04/23/2012 1940   LEUKOCYTESUR NEGATIVE 10/30/2015 1541   LEUKOCYTESUR Negative 04/23/2012 1940   Assessment & Plan: Overall he appears to be stable at this juncture. Dressings dry and clean and intact. No significant complaints of pain. No complaints of fever or chills. Plan: Leave dressing dry clean and intact. Continue bedrest this point.  Active Problems:   Unstable angina (HCC) Epimenio Sarin M.D on 11/12/2015 at 9:25 AM

## 2015-11-13 ENCOUNTER — Encounter: Payer: Self-pay | Admitting: Podiatry

## 2015-11-13 LAB — GLUCOSE, CAPILLARY
GLUCOSE-CAPILLARY: 120 mg/dL — AB (ref 65–99)
Glucose-Capillary: 166 mg/dL — ABNORMAL HIGH (ref 65–99)
Glucose-Capillary: 186 mg/dL — ABNORMAL HIGH (ref 65–99)
Glucose-Capillary: 222 mg/dL — ABNORMAL HIGH (ref 65–99)

## 2015-11-13 MED ORDER — POLYETHYLENE GLYCOL 3350 17 G PO PACK: 17.0000 g | PACK | Freq: Every day | ORAL | Status: AC

## 2015-11-13 MED ORDER — OXYCODONE HCL ER 10 MG PO T12A: 10.0000 mg | EXTENDED_RELEASE_TABLET | Freq: Two times a day (BID) | ORAL | Status: AC

## 2015-11-13 MED ORDER — CEFAZOLIN SODIUM-DEXTROSE 2-3 GM-% IV SOLR: 2.0000 g | Freq: Three times a day (TID) | INTRAVENOUS | Status: AC

## 2015-11-13 MED ORDER — OXYCODONE HCL 5 MG PO TABS: 5.0000 mg | ORAL_TABLET | ORAL | Status: AC | PRN

## 2015-11-13 MED ORDER — CIPROFLOXACIN HCL 500 MG PO TABS: 500.0000 mg | ORAL_TABLET | Freq: Two times a day (BID) | ORAL | Status: AC

## 2015-11-13 NOTE — Anesthesia Postprocedure Evaluation (Signed)
Anesthesia Post Note  Patient: Daniel [name]  Procedure(s) Performed: Procedure(s) (LRB): IRRIGATION AND DEBRIDEMENT FOOT (Right)  Patient location during evaluation: PACU Anesthesia Type: General Level of consciousness: awake and alert Pain management: pain level controlled Vital Signs Assessment: post-procedure vital signs reviewed and stable Respiratory status: spontaneous breathing, nonlabored ventilation, respiratory function stable and patient connected to nasal cannula oxygen Cardiovascular status: blood pressure returned to baseline and stable Postop Assessment: no signs of nausea or vomiting Anesthetic complications: no    Last Vitals:  Filed Vitals:   11/13/15 0746 11/13/15 1112  BP: 138/81 137/81  Pulse: 59 62  Temp: 36.8 C 36.6 C  Resp: 16 15    Last Pain:  Filed Vitals:   11/13/15 1112  PainSc: 0-No pain                 Lenard SimmerAndrew Madisan Bice

## 2015-11-13 NOTE — Progress Notes (Signed)
[facility] [place] [facility]   PATIENT NAME: Daniel Hamiltonllen [name]    MR#:  [id number]  DATE OF BIRTH:  07/12/1951  SUBJECTIVE:  No acute issues overnight. Plan for [place] tomorrow   REVIEW OF SYSTEMS:    Review of Systems  Constitutional: Negative for fever, chills and malaise/fatigue.  HENT: Negative for sore throat.   Eyes: Negative for blurred vision.  Respiratory: Negative for cough, hemoptysis, shortness of breath and wheezing.   Cardiovascular: Negative for chest pain, palpitations and leg swelling.  Gastrointestinal: Negative for nausea, vomiting, abdominal pain, diarrhea and blood in stool.  Genitourinary: Negative for dysuria.  Musculoskeletal: Negative for back pain.  Neurological: Negative for dizziness, tremors and headaches.  Endo/Heme/Allergies: Does not bruise/bleed easily.    Tolerating Diet:yes      DRUG ALLERGIES:   Allergies  Allergen Reactions  . Dilaudid [Hydromorphone] Other (See Comments)    Agitation    VITALS:  Blood pressure 138/81, pulse 59, temperature 98.3 F (36.8 C), temperature source Oral, resp. rate 16, height 5\' 9"  (1.753 m), weight 83.416 kg (183 lb 14.4 oz), SpO2 98 %.  PHYSICAL EXAMINATION:   Physical Exam  Constitutional: He is oriented to person, place, and time and well-developed, well-nourished, and in no distress. No distress.  HENT:  Head: Normocephalic.  Eyes: No scleral icterus.  Neck: Normal range of motion. Neck supple. No JVD present. No tracheal deviation present.  Cardiovascular: Normal rate, regular rhythm and normal heart sounds.  Exam reveals no gallop and no friction rub.   No murmur heard. Pulmonary/Chest: Effort normal and breath sounds normal. No respiratory distress. He has no wheezes. He has no rales. He exhibits no tenderness.  Abdominal: Soft. Bowel sounds are normal. He exhibits no distension and no mass. There is no tenderness. There is no rebound and no guarding.   Musculoskeletal: Normal range of motion. He exhibits no edema.  Neurological: He is alert and oriented to person, place, and time.  Skin: Skin is warm. No rash noted. No erythema.  Psychiatric: Affect and judgment normal.      LABORATORY PANEL:   CBC  Recent Labs Lab 11/12/15 0427  WBC 11.3*  HGB 12.3*  HCT 37.4*  PLT 313   ------------------------------------------------------------------------------------------------------------------  Chemistries   Recent Labs Lab 11/06/15 1224  11/11/15 0547  NA  --   < > 136  K 3.8  < > 4.1  CL  --   < > 102  CO2  --   < > 29  GLUCOSE  --   < > 207*  BUN  --   < > 17  CREATININE  --   < > 0.76  CALCIUM  --   < > 8.5*  MG 1.7  --   --   < > = values in this interval not displayed. ------------------------------------------------------------------------------------------------------------------  Cardiac Enzymes No results for input(s): TROPONINI in the last 168 hours. ------------------------------------------------------------------------------------------------------------------  RADIOLOGY:  No results found.   ASSESSMENT AND PLAN:   65 year old male with history of MSSA and VIRIDAN bacteremia from diabetic wound infection with underlying osteomyelitis.  1. Right great toe and foot cellulitis with osteo-myelitis and MSSA bacteremia and Providencia from wound. Patient is status post angiogram and I&D of right toe on December 30. He then had amputation of first toe on January 2. On January 6 he underwent excisional debridement of the soft tissue at the site of the amputation. Wound culture is growing out PROVEDENCIA and Staphylococcus aureus. Blood cultures  were also positive for MSSA. Per Dr. Sampson Goon recommendations patient will need at least 2 weeks of IV antibiotics for the bacteremia. He may need longer depending on the most recent surgery. Patient will continue on Ancef 2 g IV every 8 hours as well as ciprofloxacin  for the Provedencia.  2. Peripheral arterial disease: Patient status post angioplasty on 11/04/2015.  3. Elevated troponin: This is demand ischemia from CHF and current infection. No further work up as advised by cardiology.  4. Acute hypoxic respiratory failure: This was due to acute on chronic systolic heart failure. Ejection fraction of 20-25%. Patient was changed to by mouth Lasix and his respiratory status is now stable. Patient will continue on aspirin, beta blocker and statin.  5. 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 Patient will need outpatient follow-up. Case was discussed by the ER physician to the on-call ophthalmologist on admission.  6. Diabetes: Continue glipizide, sliding scale insulin, Levemir.    Management plans discussed with the patient and he is in agreement.  CODE STATUS: FULL  TOTAL TIME TAKING CARE OF THIS PATIENT: 24 minutes.     POSSIBLE D/C 2-3 days, DEPENDING ON CLINICAL CONDITION to skilled nursing facility   Amadeus Oyama M.D on 11/13/2015 at 8:35 AM  Between 7am to 6pm - Pager - [phone] After 6pm go to [email or web address] - password [email or web address] [facility]  [facility]  Office  [phone]  CC: Primary care physician; Marguarite Arbour, MD  Note: This dictation was prepared with Dragon dictation along with smaller phrase technology. Any transcriptional errors that result from this process are unintentional.

## 2015-11-13 NOTE — Progress Notes (Signed)
Physical Therapy Treatment Patient Details Name: Daniel [name] MRN: [id number] DOB: 10/05/1951 Today's Date: 11/13/2015    History of Present Illness presented to ER secondary to SOB, chest tightness x2 days; admitted with unstable angina, NSTEMI (troponin peak at 0.82, now trending downward).  Also noted for R great toe redness with ulceration, significant for cellulitis and early osteomyelitis; angiography and I & D on 12/30. Pt is now s/p amputation on first toe on R LE on 11/07/15. Pt with now osteo and gangrene and may need additional surgery. Per Dr. Alberteen Spindleline, limited mobility this date.    PT Comments    Patient progressing well with PT. He was able to ambulate farther today (24 feet) with RW, with min A; Patient continues to require min VCs for safety including hand placement with transfers and to maintain WB precautions of RLE with increased BUE weight bearing during right stance. He reports less pain today and was motivated to perform more exercise and gait training. Patient continues to require min Vcs for correct exercise technique with exercise. He demonstrates increased quad weakness with difficulty with long sitting SLR flexion; Patient would benefit from additional skilled PT intervention to improve LE strength, balance and gait safety;   Follow Up Recommendations  SNF     Equipment Recommendations       Recommendations for Other Services       Precautions / Restrictions Precautions Precautions: Fall Precaution Comments: limited heel WBing with ortho-wedge shoe to R LE Restrictions Weight Bearing Restrictions: Yes RLE Weight Bearing: Partial weight bearing    Mobility  Bed Mobility               General bed mobility comments: up in chair at start of PT treatment  Transfers Overall transfer level: Needs assistance Equipment used: Rolling walker (2 wheeled) Transfers: Sit to/from Stand Sit to Stand: Min assist         General transfer comment: Patient  utilized RLE ortho wedge shoe, RW, min A for sit<>Stand transfer from chair with min Vcs for hand placement and to advance RLE forward for less WB  Ambulation/Gait Ambulation/Gait assistance: Min assist Ambulation Distance (Feet): 24 Feet Assistive device: Rolling walker (2 wheeled) Gait Pattern/deviations: Step-to pattern;Decreased step length - left;Decreased weight shift to right;Trunk flexed Gait velocity: decreased   General Gait Details: Patient in RLE ortho wedge shoe with RW, min A with min VCs for weight bearing precautions and to increase BUE weight bearing during right stance; Patient required min VCS to stay close to RW during turns for increased safety;    Stairs            Wheelchair Mobility    Modified Rankin (Stroke Patients Only)       Balance                                    Cognition Arousal/Alertness: Awake/alert Behavior During Therapy: WFL for tasks assessed/performed Overall Cognitive Status: Within Functional Limits for tasks assessed                      Exercises Other Exercises Other Exercises: Re-educated patient in WB precautions; Patient instructed in long sitting BLE exercise: LLE ankle pumps x15; BLE SLR flexion x15 with min VCs to increase terminal knee extension for increased quad strengthening: Other Exercises: Patient required min A for maintain balance in standing while using urinal with RW;  General Comments        Pertinent Vitals/Pain Pain Assessment: No/denies pain    Home Living                      Prior Function            PT Goals (current goals can now be found in the care plan section) Acute Rehab PT Goals Patient Stated Goal: "to get this pain better" PT Goal Formulation: With patient Time For Goal Achievement: 11/23/15 Potential to Achieve Goals: Good Progress towards PT goals: Progressing toward goals    Frequency  Min 2X/week    PT Plan      Co-evaluation              End of Session Equipment Utilized During Treatment: Gait belt Activity Tolerance: Patient tolerated treatment well Patient left: in chair;with chair alarm set     Time: 1610-9604 PT Time Calculation (min) (ACUTE ONLY): 24 min  Charges:  $Gait Training: 8-22 mins $Therapeutic Exercise: 8-22 mins                    G Codes:      Rivers Gassmann PT, DPT 11/13/2015, 2:58 PM

## 2015-11-13 NOTE — Progress Notes (Signed)
2 Days Post-Op  Subjective: Patient seen. Some pain but managed with medication.  Objective: Vital signs in last 24 hours: Temp:  [97.7 F (36.5 C)-98.7 F (37.1 C)] 97.8 F (36.6 C) (01/08 1112) Pulse Rate:  [59-69] 62 (01/08 1112) Resp:  [15-20] 15 (01/08 1112) BP: (126-138)/(68-83) 137/81 mmHg (01/08 1112) SpO2:  [94 %-100 %] 96 % (01/08 1112) Weight:  [83.416 kg (183 lb 14.4 oz)] 83.416 kg (183 lb 14.4 oz) (01/08 0557) Last BM Date: 11/09/15  Intake/Output from previous day: 01/07 0701 - 01/08 0700 In: 680 [P.O.:480; IV Piggyback:200] Out: 1700 [Urine:1700] Intake/Output this shift: Total I/O In: 240 [P.O.:240] Out: -   Dressing is dry and intact. Upon removal moderate bleeding on the bandage. No significant purulence is noted. Incision is well coapted with the exception of the distal aspect which could not be completely closed. Antibiotic beads intact. At this point the skin edges appear viable.  Lab Results:   Recent Labs  11/11/15 0547 11/12/15 0427  WBC 11.0* 11.3*  HGB 12.5* 12.3*  HCT 37.9* 37.4*  PLT 311 313   BMET  Recent Labs  11/11/15 0547  NA 136  K 4.1  CL 102  CO2 29  GLUCOSE 207*  BUN 17  CREATININE 0.76  CALCIUM 8.5*   PT/INR No results for input(s): LABPROT, INR in the last 72 hours. ABG No results for input(s): PHART, HCO3 in the last 72 hours.  Invalid input(s): PCO2, PO2  Studies/Results: No results found.  Anti-infectives: Anti-infectives    Start     Dose/Rate Route Frequency Ordered Stop   11/11/15 1915  vancomycin (VANCOCIN) powder  Status:  Discontinued       As needed 11/11/15 1915 11/11/15 1928   11/08/15 2000  ciprofloxacin (CIPRO) tablet 500 mg     500 mg Oral 2 times daily 11/08/15 1432     11/08/15 1445  ceFAZolin (ANCEF) IVPB 2 g/50 mL premix     2 g 100 mL/hr over 30 Minutes Intravenous 3 times per day 11/08/15 1432     11/08/15 1200  sodium chloride 0.9 % 100 mL with ampicillin-sulbactam (UNASYN) 3 g  infusion  Status:  Discontinued     100 mL/hr  Intravenous 4 times per day 11/08/15 1000 11/08/15 1432   11/02/15 1800  Ampicillin-Sulbactam (UNASYN) 3 g in sodium chloride 0.9 % 100 mL IVPB  Status:  Discontinued     3 g 100 mL/hr over 60 Minutes Intravenous Every 6 hours 11/02/15 1522 11/08/15 1005   11/02/15 1400  piperacillin-tazobactam (ZOSYN) IVPB 3.375 g  Status:  Discontinued     3.375 g 12.5 mL/hr over 240 Minutes Intravenous 3 times per day 11/02/15 1350 11/02/15 1444   10/31/15 1130  Ampicillin-Sulbactam (UNASYN) 3 g in sodium chloride 0.9 % 100 mL IVPB  Status:  Discontinued     3 g 100 mL/hr over 60 Minutes Intravenous Every 6 hours 10/31/15 1035 11/02/15 1334   10/31/15 0500  vancomycin (VANCOCIN) IVPB 1000 mg/200 mL premix  Status:  Discontinued     1,000 mg 200 mL/hr over 60 Minutes Intravenous Every 12 hours 10/30/15 2248 11/02/15 1333   10/30/15 2130  vancomycin (VANCOCIN) IVPB 1000 mg/200 mL premix  Status:  Discontinued     1,000 mg 200 mL/hr over 60 Minutes Intravenous  Once 10/30/15 2120 10/30/15 2141   10/30/15 1645  piperacillin-tazobactam (ZOSYN) IVPB 3.375 g     3.375 g 100 mL/hr over 30 Minutes Intravenous  Once 10/30/15 1633 10/30/15  2019   10/30/15 1645  vancomycin (VANCOCIN) IVPB 1000 mg/200 mL premix     1,000 mg 200 mL/hr over 60 Minutes Intravenous  Once 10/30/15 1633 10/30/15 2019      Assessment/Plan: s/p Procedure(s): IRRIGATION AND DEBRIDEMENT FOOT (Right) Assessment: 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 At this point he should be stable for transfer to skilled nursing facility with dressing changes every couple of days and evaluation of the wound for any signs of breakdown or necrosis. Should be able to weight-bear on the heel in a surgical shoe for transfer only at this point. At this point we'll follow every couple of days until transfer and  then weekly upon transfer to skilled nursing  LOS: 14 days    Larae Caison W. 11/13/2015

## 2015-11-13 NOTE — Discharge Summary (Addendum)
[facility] [place] [facility]   PATIENT NAME: Daniel Hamiltonllen Banfill    MR#:  [id number]  DATE OF BIRTH:  04/21/1951  DATE OF ADMISSION:  10/30/2015 ADMITTING PHYSICIAN: Ramonita LabAruna Gouru, MD  DATE OF DISCHARGE: 1//07/2016  PRIMARY CARE PHYSICIAN: [name],[name], MD    ADMISSION DIAGNOSIS:  Hyperglycemia [R73.9] Cellulitis of right foot [L03.115] Troponin I above reference range [R79.89] Acute congestive heart failure, unspecified congestive heart failure type (HCC) [I50.9]  DISCHARGE DIAGNOSIS:  Right great toe osteo Elevated troponin demand ischemia  SECONDARY DIAGNOSIS:   Past Medical History  Diagnosis Date  . Diabetes mellitus without complication (HCC)   . Stented coronary artery   . Myocardial infarct (HCC)   . Hypertension   . Coronary artery disease     HOSPITAL COURSE:   65 year old male with history of MSSA and VIRIDAN bacteremia from diabetic wound infection with underlying osteomyelitis.  1. Right great toe and foot cellulitis with osteo-myelitis and MSSA bacteremia and Providencia from wound. Patient is status post angiogram and I&D of right toe on December 30. He then had amputation of first toe on January 2. On January 6 he underwent excisional debridement of the soft tissue at the site of the amputation. Wound culture is growing out PROVEDENCIA and Staphylococcus aureus. Blood cultures were also positive for MSSA. Per Dr. Sampson GoonFitzgerald recommendations patient will need at least 2 weeks of IV antibiotics for the bacteremia. He may need longer depending on the most recent surgery. Patient will continue on Ancef 2 g IV every 8 hours as well as ciprofloxacin for the Provedencia. He needs PICC care as per protocol.  2. Peripheral arterial disease: Patient status post angioplasty on 11/04/2015.  3. Elevated troponin: This is demand ischemia from CHF and current infection. No further work up as advised by cardiology.  4. Acute hypoxic  respiratory failure: This was due to acute on chronic systolic heart failure. Ejection fraction of 20-25%. Patient was changed to by mouth Lasix and his respiratory status is now stable. Patient will continue on aspirin, beta blocker and statin.  5. 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 Patient will need outpatient follow-up. Case was discussed by the ER physician to the on-call ophthalmologist on admission.  6. Diabetes: Continue outpatient medications and ADA diet  7. Stage 2 pressure injury to right buttock, pressure and moisture contributing.    Pressure Ulcer POA: Yes Stage 2 to right buttocks near ischium .  Dressing procedure/placement/frequency:Cleanse right great toe with soap and water and pat dry. Place calcium alginate (ALGISITE) to wound bed and between great toe and second metatarsal to keep dry. Cover with kerlix and secure with tape. Change daily.  Silicone border foam to right buttocks lesion   DISCHARGE CONDITIONS AND DIET:   stable Diabetic diet  CONSULTS OBTAINED:  Treatment Team:  Alwyn Peawayne D Callwood, MD Clydie Braunavid Fitzgerald, MD Renford DillsGregory G Schnier, MD  DRUG ALLERGIES:   Allergies  Allergen Reactions  . Dilaudid [Hydromorphone] Other (See Comments)    Agitation    DISCHARGE MEDICATIONS:   Current Discharge Medication List    START taking these medications   Details  ceFAZolin (ANCEF) 2-3 GM-% SOLR Inject 50 mLs (2 g total) into the vein every 8 (eight) hours. Qty: 1 each, Refills: 3    ciprofloxacin (CIPRO) 500 MG tablet Take 1 tablet (500 mg total) by mouth 2 (two) times daily. Qty: 28 tablet, Refills: 0    oxyCODONE (OXY IR/ROXICODONE) 5 MG immediate release tablet Take 1  tablet (5 mg total) by mouth every 4 (four) hours as needed for moderate pain, severe pain or breakthrough pain. Qty: 30 tablet, Refills: 0    oxyCODONE (OXYCONTIN) 10 mg 12 hr tablet Take 1 tablet (10 mg total) by mouth every 12 (twelve) hours. Qty: 60 tablet,  Refills: 0    polyethylene glycol (MIRALAX / GLYCOLAX) packet Take 17 g by mouth daily. Qty: 14 each, Refills: 0      CONTINUE these medications which have NOT CHANGED   Details  aspirin EC 81 MG tablet Take 81 mg by mouth daily.    atorvastatin (LIPITOR) 10 MG tablet Take 10 mg by mouth at bedtime.    furosemide (LASIX) 40 MG tablet Take 40 mg by mouth daily.    gabapentin (NEURONTIN) 600 MG tablet Take 600 mg by mouth 3 (three) times daily.    glipiZIDE (GLUCOTROL) 10 MG tablet Take 10 mg by mouth 2 (two) times daily before a meal.    losartan (COZAAR) 100 MG tablet Take 100 mg by mouth daily.    metFORMIN (GLUCOPHAGE) 500 MG tablet Take 1,000 mg by mouth 2 (two) times daily.    omeprazole (PRILOSEC) 20 MG capsule Take 20 mg by mouth daily.    pioglitazone (ACTOS) 30 MG tablet Take 30 mg by mouth daily.    pregabalin (LYRICA) 75 MG capsule Take 75 mg by mouth 2 (two) times daily.              Today   CHIEF COMPLAINT:  Doing well this am no acute issues   VITAL SIGNS:  Blood pressure 158/82, pulse 71, temperature 97.6 F (36.4 C), temperature source Oral, resp. rate 18, height 5\' 9"  (1.753 m), weight 86.41 kg (190 lb 8 oz), SpO2 99 %.   REVIEW OF SYSTEMS:  Review of Systems  Constitutional: Negative for fever, chills and malaise/fatigue.  HENT: Negative for sore throat.   Eyes: Negative for blurred vision.  Respiratory: Negative for cough, hemoptysis, shortness of breath and wheezing.   Cardiovascular: Negative for chest pain, palpitations and leg swelling.  Gastrointestinal: Negative for nausea, vomiting, abdominal pain, diarrhea and blood in stool.  Genitourinary: Negative for dysuria.  Musculoskeletal: Negative for back pain.  Neurological: Negative for dizziness, tremors and headaches.  Endo/Heme/Allergies: Does not bruise/bleed easily.     PHYSICAL EXAMINATION:  GENERAL:  65 y.o.-year-old patient lying in the bed with no acute distress.  NECK:   Supple, no jugular venous distention. No thyroid enlargement, no tenderness.  LUNGS: Normal breath sounds bilaterally, no wheezing, rales,rhonchi  No use of accessory muscles of respiration.  CARDIOVASCULAR: S1, S2 normal. No murmurs, rubs, or gallops.  ABDOMEN: Soft, non-tender, non-distended. Bowel sounds present. No organomegaly or mass.  EXTREMITIES: No pedal edema, cyanosis, or clubbing.  PSYCHIATRIC: The patient is alert and oriented x 3.  SKIN: foot covered  DATA REVIEW:   CBC  Recent Labs Lab 11/14/15 0409  WBC 7.5  HGB 12.0*  HCT 36.2*  PLT 282    Chemistries   Recent Labs Lab 11/11/15 0547 11/14/15 0409  NA 136  --   K 4.1  --   CL 102  --   CO2 29  --   GLUCOSE 207*  --   BUN 17  --   CREATININE 0.76 0.84  CALCIUM 8.5*  --     Cardiac Enzymes No results for input(s): TROPONINI in the last 168 hours.  Microbiology Results  @MICRORSLT48 @  RADIOLOGY:  No results found.  Management plans discussed with the patient and he is in agreement. Stable for discharge   Patient should follow up with podiatry 1 week OPTHO in 1 week  CODE STATUS:     Code Status Orders        Start     Ordered   11/11/15 2032  Full code   Continuous     11/11/15 2031    Advance Directive Documentation        Most Recent Value   Type of Advance Directive  Healthcare Power of Attorney   Pre-existing out of facility DNR order (yellow form or pink MOST form)     "MOST" Form in Place?        TOTAL TIME TAKING CARE OF THIS PATIENT: 35 minutes.    Note: This dictation was prepared with Dragon dictation along with smaller phrase technology. Any transcriptional errors that result from this process are unintentional.  Darik Massing M.D on 11/14/2015 at 10:25 AM  Between 7am to 6pm - Pager - [phone] After 6pm go to [email or web address] - password [email or web address]  [name]  Office  [phone]  CC: Primary care physician; Marguarite Arbour, MD

## 2015-11-13 NOTE — Progress Notes (Signed)
Room air. Takes meds whole with water. Pt reported pain and received oxy. Up to chair with PT. Dressing change was done by MD Troxler. [facility]. Brother Windy FastROnald was updated on plan of care. Pt has no further concerns at this time.

## 2015-11-14 ENCOUNTER — Inpatient Hospital Stay: Payer: Commercial Managed Care - HMO

## 2015-11-14 LAB — GLUCOSE, CAPILLARY
GLUCOSE-CAPILLARY: 140 mg/dL — AB (ref 65–99)
GLUCOSE-CAPILLARY: 259 mg/dL — AB (ref 65–99)
Glucose-Capillary: 94 mg/dL (ref 65–99)
Glucose-Capillary: 139 mg/dL — ABNORMAL HIGH (ref 65–99)

## 2015-11-14 LAB — CREATININE, SERUM: Creatinine, Ser: 0.84 mg/dL (ref 0.61–1.24)

## 2015-11-14 LAB — CBC WITH DIFFERENTIAL/PLATELET
BASOS PCT: 1 %
Basophils Absolute: 0.1 10*3/uL (ref 0–0.1)
Eosinophils Absolute: 0.2 10*3/uL (ref 0–0.7)
Eosinophils Relative: 2 %
HEMATOCRIT: 36.2 % — AB (ref 40.0–52.0)
HEMOGLOBIN: 12 g/dL — AB (ref 13.0–18.0)
LYMPHS PCT: 23 %
Lymphs Abs: 1.7 10*3/uL (ref 1.0–3.6)
MCH: 29.3 pg (ref 26.0–34.0)
MCHC: 33.3 g/dL (ref 32.0–36.0)
MCV: 88 fL (ref 80.0–100.0)
MONOS PCT: 9 %
Monocytes Absolute: 0.6 10*3/uL (ref 0.2–1.0)
NEUTROS ABS: 4.9 10*3/uL (ref 1.4–6.5)
NEUTROS PCT: 65 %
Platelets: 282 10*3/uL (ref 150–440)
RBC: 4.11 MIL/uL — ABNORMAL LOW (ref 4.40–5.90)
RDW: 14.7 % — ABNORMAL HIGH (ref 11.5–14.5)
WBC: 7.5 10*3/uL (ref 3.8–10.6)

## 2015-11-14 MED ORDER — FLEET ENEMA 7-19 GM/118ML RE ENEM
1.0000 | ENEMA | Freq: Once | RECTAL | Status: AC
Start: 2015-11-14 — End: 2015-11-14
  Administered 2015-11-14: 1 via RECTAL

## 2015-11-14 MED ORDER — BISACODYL 10 MG RE SUPP
10.0000 mg | Freq: Every day | RECTAL | Status: DC
  Administered 2015-11-14: 10 mg via RECTAL
  Filled 2015-11-14: qty 1

## 2015-11-14 MED ORDER — SENNA 8.6 MG PO TABS
1.0000 | ORAL_TABLET | Freq: Once | ORAL | Status: AC
  Administered 2015-11-14: 8.6 mg via ORAL
  Filled 2015-11-14: qty 1

## 2015-11-14 MED ORDER — LACTULOSE 10 GM/15ML PO SOLN: 30.0000 g | Freq: Three times a day (TID) | ORAL | Status: DC

## 2015-11-14 NOTE — Clinical Social Work Placement (Signed)
   CLINICAL SOCIAL WORK PLACEMENT  NOTE  Date:  11/14/2015  Patient Details  Name: Daniel [name] MRN: [id number] Date of Birth: 11/12/1950  Clinical Social Work is seeking post-discharge placement for this patient at the Skilled  Nursing Facility level of care (*CSW will initial, date and re-position this form in  chart as items are completed):  Yes   Patient/family provided with [facility] Clinical Social Work Department's list of facilities offering this level of care within the geographic area requested by the patient (or if unable, by the patient's family).  Yes   Patient/family informed of their freedom to choose among providers that offer the needed level of care, that participate in Medicare, Medicaid or managed care program needed by the patient, have an available bed and are willing to accept the patient.  Yes   Patient/family informed of [facility]'s ownership interest in [facility] and [facility], as well as of the fact that they are under no obligation to receive care at these facilities.  PASRR submitted to EDS on 11/01/15     PASRR number received on 11/01/15     Existing PASRR number confirmed on       FL2 transmitted to all facilities in geographic area requested by pt/family on 11/02/15     FL2 transmitted to all facilities within larger geographic area on       Patient informed that his/her managed care company has contracts with or will negotiate with certain facilities, including the following:        Yes   Patient/family informed of bed offers received.  Patient chooses bed at  [facility]     Physician recommends and patient chooses bed at  [facility](SNF)    Patient to be transferred to  [facility] on 11/14/15.  Patient to be transferred to facility by  (EMS)     Patient family notified on 11/14/15 of transfer.  Name of family member notified:   (Patient and brother)     PHYSICIAN       Additional Comment:     _______________________________________________ York SpanielMonica Elishah Ashmore, LCSW 11/14/2015, 12:52 PM

## 2015-11-14 NOTE — Progress Notes (Signed)
3 Days Post-Op  Subjective: Patient seen. Doing well.  Objective: Vital signs in last 24 hours: Temp:  [97.6 F (36.4 C)-98.9 F (37.2 C)] 97.6 F (36.4 C) (01/09 0741) Pulse Rate:  [58-71] 71 (01/09 0741) Resp:  [15-18] 18 (01/09 0741) BP: (119-181)/(73-90) 158/82 mmHg (01/09 0758) SpO2:  [96 %-100 %] 99 % (01/09 0741) Weight:  [86.41 kg (190 lb 8 oz)] 86.41 kg (190 lb 8 oz) (01/09 0500) Last BM Date: 11/09/15  Intake/Output from previous day: 01/08 0701 - 01/09 0700 In: 480 [P.O.:480] Out: 1000 [Urine:1000] Intake/Output this shift: Total I/O In: -  Out: 400 [Urine:400]  The bandages dry and intact. No evidence of bleeding through the bandage. White count significantly improved  Lab Results:   Recent Labs  11/12/15 0427 11/14/15 0409  WBC 11.3* 7.5  HGB 12.3* 12.0*  HCT 37.4* 36.2*  PLT 313 282   BMET  Recent Labs  11/14/15 0409  CREATININE 0.84   PT/INR No results for input(s): LABPROT, INR in the last 72 hours. ABG No results for input(s): PHART, HCO3 in the last 72 hours.  Invalid input(s): PCO2, PO2  Studies/Results: No results found.  Anti-infectives: Anti-infectives    Start     Dose/Rate Route Frequency Ordered Stop   11/13/15 0000  ceFAZolin (ANCEF) 2-3 GM-% SOLR     2 g 100 mL/hr over 30 Minutes Intravenous Every 8 hours 11/13/15 2047 11/27/15 2359   11/13/15 0000  ciprofloxacin (CIPRO) 500 MG tablet     500 mg Oral 2 times daily 11/13/15 2047 11/27/15 2359   11/11/15 1915  vancomycin (VANCOCIN) powder  Status:  Discontinued       As needed 11/11/15 1915 11/11/15 1928   11/08/15 2000  ciprofloxacin (CIPRO) tablet 500 mg     500 mg Oral 2 times daily 11/08/15 1432     11/08/15 1445  ceFAZolin (ANCEF) IVPB 2 g/50 mL premix     2 g 100 mL/hr over 30 Minutes Intravenous 3 times per day 11/08/15 1432     11/08/15 1200  sodium chloride 0.9 % 100 mL with ampicillin-sulbactam (UNASYN) 3 g infusion  Status:  Discontinued     100 mL/hr   Intravenous 4 times per day 11/08/15 1000 11/08/15 1432   11/02/15 1800  Ampicillin-Sulbactam (UNASYN) 3 g in sodium chloride 0.9 % 100 mL IVPB  Status:  Discontinued     3 g 100 mL/hr over 60 Minutes Intravenous Every 6 hours 11/02/15 1522 11/08/15 1005   11/02/15 1400  piperacillin-tazobactam (ZOSYN) IVPB 3.375 g  Status:  Discontinued     3.375 g 12.5 mL/hr over 240 Minutes Intravenous 3 times per day 11/02/15 1350 11/02/15 1444   10/31/15 1130  Ampicillin-Sulbactam (UNASYN) 3 g in sodium chloride 0.9 % 100 mL IVPB  Status:  Discontinued     3 g 100 mL/hr over 60 Minutes Intravenous Every 6 hours 10/31/15 1035 11/02/15 1334   10/31/15 0500  vancomycin (VANCOCIN) IVPB 1000 mg/200 mL premix  Status:  Discontinued     1,000 mg 200 mL/hr over 60 Minutes Intravenous Every 12 hours 10/30/15 2248 11/02/15 1333   10/30/15 2130  vancomycin (VANCOCIN) IVPB 1000 mg/200 mL premix  Status:  Discontinued     1,000 mg 200 mL/hr over 60 Minutes Intravenous  Once 10/30/15 2120 10/30/15 2141   10/30/15 1645  piperacillin-tazobactam (ZOSYN) IVPB 3.375 g     3.375 g 100 mL/hr over 30 Minutes Intravenous  Once 10/30/15 1633 10/30/15 2019  10/30/15 1645  vancomycin (VANCOCIN) IVPB 1000 mg/200 mL premix     1,000 mg 200 mL/hr over 60 Minutes Intravenous  Once 10/30/15 1633 10/30/15 2019      Assessment/Plan: s/p Procedure(s): IRRIGATION AND DEBRIDEMENT FOOT (Right) Assessment: Osteomyelitis with gangrene right great toe status post amputation, stable   Plan: Dressing left intact. Upon discharge or transfer he will need dressing changes every couple of days to allow for drainage. Recommend follow-up in approximately one week for evaluation of the wound. If not discharged today we will plan for a dressing change tomorrow prior to leaving.  LOS: 15 days    Niyana Chesbro W. 11/14/2015

## 2015-11-14 NOTE — Clinical Social Work Note (Signed)
[facility] to [facility] [id number]. [facility] and nurse to call report. Patient wishes to transport via EMS even though he is aware that there is a possibility it will not be covered. York SpanielMonica Weslee Fogg MSW,LCSW [phone]

## 2015-11-14 NOTE — Care Management Important Message (Signed)
Important Message  Patient Details  Name: Daniel [name] MRN: [id number] Date of Birth: 11/14/1950   Medicare Important Message Given:  Yes    [name] 11/14/2015, 10:58 AM

## 2015-11-14 NOTE — Progress Notes (Signed)
ID E note S/p excisional debridement 1/6.   Assessment/Plan:  Daniel [name] is a 65 y.o. 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 Wound cx with heavy growth staph aureus and mod growth Providenceia. A1c 12.2. Vascular eval s/p angioplasty 12/30 . Denies smoking.  ESR 18, CRP 24.9. HIV NR. FU BCX 12/27 NGTD. Tissue cx 1/2 with GNR pendign Echo with EF 20-25%, no vegetations noted. PICC placed Wound with some necrosis on edges per podiatry - for surgery today   Recommendations Will need at least 2 weeks IV abx for the S aureus bacteremia - tentative stop date 1/17  Will likely need longer therapy depending on the most recent surgery He will eventually be discharged to SNF  Will cont ancef 2 gm q 8 IV and oral cipro (for the providencia) See abx order sheet from 1/3

## 2015-11-14 NOTE — Progress Notes (Signed)
DISCHARGE NOTE:  [place] [facility] transporting pt.

## 2015-11-16 LAB — GLUCOSE, CAPILLARY
GLUCOSE-CAPILLARY: 289 mg/dL — AB (ref 65–99)
GLUCOSE-CAPILLARY: 139 mg/dL — AB (ref 65–99)
GLUCOSE-CAPILLARY: 238 mg/dL — AB (ref 65–99)
GLUCOSE-CAPILLARY: 182 mg/dL — AB (ref 65–99)
GLUCOSE-CAPILLARY: 135 mg/dL — AB (ref 65–99)
Glucose-Capillary: 245 mg/dL — ABNORMAL HIGH (ref 65–99)
Glucose-Capillary: 361 mg/dL — ABNORMAL HIGH (ref 65–99)
Glucose-Capillary: 340 mg/dL — ABNORMAL HIGH (ref 65–99)

## 2015-11-17 LAB — GLUCOSE, CAPILLARY
GLUCOSE-CAPILLARY: 249 mg/dL — AB (ref 65–99)
GLUCOSE-CAPILLARY: 301 mg/dL — AB (ref 65–99)
Glucose-Capillary: 146 mg/dL — ABNORMAL HIGH (ref 65–99)
Glucose-Capillary: 217 mg/dL — ABNORMAL HIGH (ref 65–99)

## 2015-11-19 LAB — GLUCOSE, CAPILLARY
GLUCOSE-CAPILLARY: 228 mg/dL — AB (ref 65–99)
GLUCOSE-CAPILLARY: 233 mg/dL — AB (ref 65–99)
GLUCOSE-CAPILLARY: 100 mg/dL — AB (ref 65–99)
Glucose-Capillary: 179 mg/dL — ABNORMAL HIGH (ref 65–99)
Glucose-Capillary: 104 mg/dL — ABNORMAL HIGH (ref 65–99)
Glucose-Capillary: 334 mg/dL — ABNORMAL HIGH (ref 65–99)
Glucose-Capillary: 99 mg/dL (ref 65–99)

## 2015-11-20 LAB — GLUCOSE, CAPILLARY
GLUCOSE-CAPILLARY: 371 mg/dL — AB (ref 65–99)
Glucose-Capillary: 279 mg/dL — ABNORMAL HIGH (ref 65–99)
Glucose-Capillary: 169 mg/dL — ABNORMAL HIGH (ref 65–99)
Glucose-Capillary: 147 mg/dL — ABNORMAL HIGH (ref 65–99)
Glucose-Capillary: 184 mg/dL — ABNORMAL HIGH (ref 65–99)
Glucose-Capillary: 136 mg/dL — ABNORMAL HIGH (ref 65–99)

## 2015-11-21 LAB — GLUCOSE, CAPILLARY
GLUCOSE-CAPILLARY: 167 mg/dL — AB (ref 65–99)
GLUCOSE-CAPILLARY: 303 mg/dL — AB (ref 65–99)
GLUCOSE-CAPILLARY: 200 mg/dL — AB (ref 65–99)
Glucose-Capillary: 236 mg/dL — ABNORMAL HIGH (ref 65–99)

## 2015-11-22 LAB — GLUCOSE, CAPILLARY
GLUCOSE-CAPILLARY: 235 mg/dL — AB (ref 65–99)
GLUCOSE-CAPILLARY: 194 mg/dL — AB (ref 65–99)
GLUCOSE-CAPILLARY: 180 mg/dL — AB (ref 65–99)
Glucose-Capillary: 124 mg/dL — ABNORMAL HIGH (ref 65–99)
Glucose-Capillary: 215 mg/dL — ABNORMAL HIGH (ref 65–99)
Glucose-Capillary: 136 mg/dL — ABNORMAL HIGH (ref 65–99)
Glucose-Capillary: 160 mg/dL — ABNORMAL HIGH (ref 65–99)

## 2015-11-23 LAB — GLUCOSE, CAPILLARY
GLUCOSE-CAPILLARY: 257 mg/dL — AB (ref 65–99)
GLUCOSE-CAPILLARY: 98 mg/dL (ref 65–99)
Glucose-Capillary: 146 mg/dL — ABNORMAL HIGH (ref 65–99)
Glucose-Capillary: 161 mg/dL — ABNORMAL HIGH (ref 65–99)

## 2015-11-25 LAB — GLUCOSE, CAPILLARY
GLUCOSE-CAPILLARY: 156 mg/dL — AB (ref 65–99)
GLUCOSE-CAPILLARY: 91 mg/dL (ref 65–99)
GLUCOSE-CAPILLARY: 219 mg/dL — AB (ref 65–99)
GLUCOSE-CAPILLARY: 121 mg/dL — AB (ref 65–99)
Glucose-Capillary: 113 mg/dL — ABNORMAL HIGH (ref 65–99)
Glucose-Capillary: 223 mg/dL — ABNORMAL HIGH (ref 65–99)
Glucose-Capillary: 94 mg/dL (ref 65–99)
Glucose-Capillary: 305 mg/dL — ABNORMAL HIGH (ref 65–99)

## 2015-11-26 LAB — GLUCOSE, CAPILLARY
GLUCOSE-CAPILLARY: 170 mg/dL — AB (ref 65–99)
Glucose-Capillary: 148 mg/dL — ABNORMAL HIGH (ref 65–99)
Glucose-Capillary: 202 mg/dL — ABNORMAL HIGH (ref 65–99)

## 2015-11-27 LAB — GLUCOSE, CAPILLARY
Glucose-Capillary: 114 mg/dL — ABNORMAL HIGH (ref 65–99)
Glucose-Capillary: 177 mg/dL — ABNORMAL HIGH (ref 65–99)
Glucose-Capillary: 135 mg/dL — ABNORMAL HIGH (ref 65–99)
Glucose-Capillary: 168 mg/dL — ABNORMAL HIGH (ref 65–99)

## 2015-11-29 LAB — GLUCOSE, CAPILLARY: Glucose-Capillary: 113 mg/dL — ABNORMAL HIGH (ref 65–99)

## 2016-02-07 ENCOUNTER — Emergency Department
Admission: EM | Admit: 2016-02-07 | Discharge: 2016-03-05 | Disposition: E | Payer: Commercial Managed Care - HMO | Attending: Emergency Medicine | Admitting: Emergency Medicine

## 2016-02-07 ENCOUNTER — Emergency Department: Payer: Commercial Managed Care - HMO

## 2016-02-07 DIAGNOSIS — I10 Essential (primary) hypertension: Secondary | ICD-10-CM | POA: Diagnosis not present

## 2016-02-07 DIAGNOSIS — Z79899 Other long term (current) drug therapy: Secondary | ICD-10-CM | POA: Diagnosis not present

## 2016-02-07 DIAGNOSIS — Z7984 Long term (current) use of oral hypoglycemic drugs: Secondary | ICD-10-CM | POA: Insufficient documentation

## 2016-02-07 DIAGNOSIS — I214 Non-ST elevation (NSTEMI) myocardial infarction: Secondary | ICD-10-CM | POA: Insufficient documentation

## 2016-02-07 DIAGNOSIS — Z7982 Long term (current) use of aspirin: Secondary | ICD-10-CM | POA: Diagnosis not present

## 2016-02-07 DIAGNOSIS — R531 Weakness: Secondary | ICD-10-CM | POA: Diagnosis present

## 2016-02-07 DIAGNOSIS — E1165 Type 2 diabetes mellitus with hyperglycemia: Secondary | ICD-10-CM | POA: Diagnosis not present

## 2016-02-07 DIAGNOSIS — I442 Atrioventricular block, complete: Secondary | ICD-10-CM | POA: Diagnosis not present

## 2016-02-07 LAB — CBC WITH DIFFERENTIAL/PLATELET
Basophils Absolute: 0 10*3/uL (ref 0–0.1)
Basophils Relative: 0 %
Eosinophils Absolute: 0 10*3/uL (ref 0–0.7)
Eosinophils Relative: 0 %
HEMATOCRIT: 42.6 % (ref 40.0–52.0)
HEMOGLOBIN: 12.6 g/dL — AB (ref 13.0–18.0)
LYMPHS ABS: 2.8 10*3/uL (ref 1.0–3.6)
Lymphocytes Relative: 31 %
MCH: 29.4 pg (ref 26.0–34.0)
MCHC: 29.7 g/dL — AB (ref 32.0–36.0)
MCV: 98.8 fL (ref 80.0–100.0)
Monocytes Absolute: 0.6 10*3/uL (ref 0.2–1.0)
NEUTROS ABS: 5.6 10*3/uL (ref 1.4–6.5)
Platelets: 166 10*3/uL (ref 150–440)
RBC: 4.31 MIL/uL — AB (ref 4.40–5.90)
RDW: 16.9 % — ABNORMAL HIGH (ref 11.5–14.5)
WBC: 9.1 10*3/uL (ref 3.8–10.6)

## 2016-02-07 LAB — COMPREHENSIVE METABOLIC PANEL
ALBUMIN: 3.7 g/dL (ref 3.5–5.0)
ALT: 27 U/L (ref 17–63)
ANION GAP: 19 — AB (ref 5–15)
AST: 48 U/L — AB (ref 15–41)
Alkaline Phosphatase: 129 U/L — ABNORMAL HIGH (ref 38–126)
BUN: 21 mg/dL — AB (ref 6–20)
CHLORIDE: 82 mmol/L — AB (ref 101–111)
CO2: 18 mmol/L — AB (ref 22–32)
Calcium: 7.6 mg/dL — ABNORMAL LOW (ref 8.9–10.3)
Creatinine, Ser: 1.62 mg/dL — ABNORMAL HIGH (ref 0.61–1.24)
GFR calc Af Amer: 51 mL/min — ABNORMAL LOW (ref >60)
GFR calc non Af Amer: 44 mL/min — ABNORMAL LOW (ref >60)
GLUCOSE: 1357 mg/dL — AB (ref 65–99)
POTASSIUM: 4.1 mmol/L (ref 3.5–5.1)
SODIUM: 119 mmol/L — AB (ref 135–145)
Total Bilirubin: 0.5 mg/dL (ref 0.3–1.2)
Total Protein: 6.3 g/dL — ABNORMAL LOW (ref 6.5–8.1)

## 2016-02-07 LAB — GLUCOSE, CAPILLARY

## 2016-02-07 LAB — PROTIME-INR
INR: 1.19
Prothrombin Time: 15.3 seconds — ABNORMAL HIGH (ref 11.4–15.0)

## 2016-02-07 LAB — TROPONIN I: Troponin I: 1.94 ng/mL — ABNORMAL HIGH (ref <0.031)

## 2016-02-07 LAB — LIPASE, BLOOD: Lipase: 16 U/L (ref 11–51)

## 2016-02-07 LAB — BRAIN NATRIURETIC PEPTIDE: B Natriuretic Peptide: 1372 pg/mL — ABNORMAL HIGH (ref 0.0–100.0)

## 2016-02-07 LAB — LACTIC ACID, PLASMA: Lactic Acid, Venous: 9.8 mmol/L (ref 0.5–2.0)

## 2016-02-07 NOTE — ED Notes (Signed)
[facility] while paying a bill.  Pt collapsed/weakness on the scene per ems.  On arrival to er pt talking, but began vomiting and became weak and not talking.  md and family at bedside.

## 2016-02-07 NOTE — ED Notes (Signed)
Pt awake with eyes open. Pt placed on 2 liters oxygen nasal canula.  Pt denies chest pain but reports sob.  Pt pale.  Skin cool and dry.  Pt requesting food and drink.   Pt denies abd pain.

## 2016-02-07 NOTE — ED Notes (Signed)
Lab called with glucose of 1357, troponin 1.94, and sodium 119.  Dr Shaune Pollacklord aware.

## 2016-02-07 NOTE — ED Notes (Addendum)
Dr Shaune Pollacklord at bedside with family and chaplin.

## 2016-02-07 NOTE — ED Notes (Addendum)
Family at bedside.  Monitor placed on comfort care.  md at bedside.  Iv fluids infusing.  Pt now on nonrebreather. Dr Shaune Pollacklord spoke with family and only comfort measures are being done at this time.

## 2016-02-07 NOTE — ED Notes (Addendum)
md talking with family in family room.  Pt grunting. Pt bagged. Pt nonverbal.  Iv fluids infusing.

## 2016-02-07 NOTE — ED Notes (Addendum)
Pt brought in via ems with weakness and near syncope and vomiting on arrival to er treatment room.  md at bedside.  Iv fluids infusing.     Pt with eyes closed and nonverbal.  Nonrebreather in place.  Family at bedside.

## 2016-02-07 NOTE — ED Provider Notes (Signed)
[facility] Emergency Department Provider Note   ____________________________________________  Time seen:  I have reviewed the triage vital signs and the triage nursing note.  HISTORY  Chief Complaint Hyperglycemia and Weakness   Historian Limited history from patient, as he is initially unresponsive Brother, Ron, healthcare power of attorney provide some history.  HPI Daniel [name] is a 65 y.o. male with a history of prior MI, and diabetes, as well as recent right foot surgery with podiatry, who was brought in after EMS was called while patient was at a store and had what sounds like a syncopal episode. EMS reports that when they got therethe patient was a little confused but able to talk with them. When the patient was moved over to the ED stretcher, he apparently had some staring, mild twitching, concerning to the nurse for possible seizure activity, and I was called to the bedside immediately.  Patient does live alone. He is reportedly noncompliant with his medications.  Later the patient did come around from the syncopal episode and was able to state that he had been feeling fine, without any recent chest pain or trouble breathing or fevers, just pain to his right foot where he had surgery. Today he was getting multiple errands done when he had lightheadedness and then reportedly passed out. There is no report of trauma.    Past Medical History  Diagnosis Date  . Diabetes mellitus without complication (HCC)   . Stented coronary artery   . Myocardial infarct (HCC)   . Hypertension   . Coronary artery disease     Patient Active Problem List   Diagnosis Date Noted  . Unstable angina (HCC) 10/30/2015    Past Surgical History  Procedure Laterality Date  . Wound debridement Right 11/04/2015    Procedure: DEBRIDEMENT WOUND;  Surgeon: [name], MD;  Location: [facility] ORS;  Service: Vascular;  Laterality: Right;  . Peripheral vascular  catheterization N/A 11/04/2015    Procedure: Lower Extremity Angiography;  Surgeon: [name], MD;  Location: [facility] INVASIVE CV LAB;  Service: Cardiovascular;  Laterality: N/A;  . Peripheral vascular catheterization N/A 11/04/2015    Procedure: Lower Extremity Intervention;  Surgeon: [name], MD;  Location: [facility] INVASIVE CV LAB;  Service: Cardiovascular;  Laterality: N/A;  . Amputation toe Right 11/07/2015    Procedure: AMPUTATION TOE and 1st ray amputation;  Surgeon: Linus Galas, MD;  Location: [facility] ORS;  Service: Podiatry;  Laterality: Right;  . Irrigation and debridement foot Right 11/11/2015    Procedure: IRRIGATION AND DEBRIDEMENT FOOT;  Surgeon: Linus Galas, MD;  Location: [facility] ORS;  Service: Podiatry;  Laterality: Right;    Current Outpatient Rx  Name  Route  Sig  Dispense  Refill  . aspirin EC 81 MG tablet   Oral   Take 81 mg by mouth daily.         Marland Kitchen atorvastatin (LIPITOR) 10 MG tablet   Oral   Take 10 mg by mouth at bedtime.         . furosemide (LASIX) 40 MG tablet   Oral   Take 40 mg by mouth daily.         Marland Kitchen gabapentin (NEURONTIN) 600 MG tablet   Oral   Take 600 mg by mouth 3 (three) times daily.         Marland Kitchen glipiZIDE (GLUCOTROL) 10 MG tablet   Oral   Take 10 mg by mouth 2 (two) times daily before a meal.         .  losartan (COZAAR) 100 MG tablet   Oral   Take 100 mg by mouth daily.         . metFORMIN (GLUCOPHAGE) 500 MG tablet   Oral   Take 1,000 mg by mouth 2 (two) times daily.         Marland Kitchen. omeprazole (PRILOSEC) 20 MG capsule   Oral   Take 20 mg by mouth daily.         Marland Kitchen. oxyCODONE (OXY IR/ROXICODONE) 5 MG immediate release tablet   Oral   Take 1 tablet (5 mg total) by mouth every 4 (four) hours as needed for moderate pain, severe pain or breakthrough pain.   30 tablet   0   . oxyCODONE (OXYCONTIN) 10 mg 12 hr tablet   Oral   Take 1 tablet (10 mg total) by mouth every 12 (twelve) hours.   60 tablet   0   . pioglitazone  (ACTOS) 30 MG tablet   Oral   Take 30 mg by mouth daily.         . polyethylene glycol (MIRALAX / GLYCOLAX) packet   Oral   Take 17 g by mouth daily.   14 each   0   . pregabalin (LYRICA) 75 MG capsule   Oral   Take 75 mg by mouth 2 (two) times daily.           Allergies Dilaudid  No family history on file.  Social History Social History  Substance Use Topics  . Smoking status: Never Smoker   . Smokeless tobacco: Not on file  . Alcohol Use: No    Review of Systems Limited due to critical illness, unable to obtain additional full review of systems Constitutional: Negative for fever. Eyes:  ENT:  Cardiovascular: Negative for chest pain. Respiratory: Negative for shortness of breath. Gastrointestinal: Emesis times one just before syncope today.. Genitourinary:  Musculoskeletal:  Skin: Negative for rash. Neurological: Negative for headache.  ____________________________________________   PHYSICAL EXAM:  VITAL SIGNS: ED Triage Vitals  Enc Vitals Group     BP 04-11-2016 1649 125/101 mmHg     Pulse Rate 04-11-2016 1645 48     Resp 04-11-2016 1645 23     Temp 04-11-2016 1649 97.9 F (36.6 C)     Temp Source 04-11-2016 1649 Axillary     SpO2 04-11-2016 1645 97 %     Weight 04-11-2016 1649 165 lb (74.844 kg)     Height 04-11-2016 1649 5\' 9"  (1.753 m)     Head Cir --      Peak Flow --      Pain Score --      Pain Loc --      Pain Edu? --      Excl. in GC? --      Constitutional: Pale and minimally responsive to sternal rub.  HEENT   Head: Normocephalic and atraumatic.      Eyes: Conjunctivae are normal. PERRL. Normal extraocular movements.      Ears:         Nose: No congestion/rhinnorhea.   Mouth/Throat: Mucous membranes are moist.   Neck: No stridor. Cardiovascular/Chest: Bradycardic, regular..  No murmurs, rubs, or gallops. Respiratory: Normal respiratory effort without tachypnea nor retractions. Breath sounds are clear and equal bilaterally. No  wheezes/rales/rhonchi. Gastrointestinal: Soft. No distention, no guarding, no rebound.    Genitourinary/rectal:Deferred Musculoskeletal: Nontender with normal range of motion in all extremities. No joint effusions.   No edema. Right foot great toe amputation site appears  to be healing without evidence of acute cellulitis or drainage. Neurologic:  Some moaning, eye opening with sternal rub, moving 4 extremities. Skin:  Skin is warm, dry and intact. No rash noted.   ____________________________________________   EKG I, Governor Rooks, MD, the attending physician have personally viewed and interpreted all ECGs.  51 bpm. Complete heart block. Left bundle blanch block. Comparison with prior EKG, patient had incomplete left bundle branch block in the past ____________________________________________  LABS (pertinent positives/negatives)  Comprehensive metabolic panel significant for sodium 119, chloride 82, CO2 18, glucose 1357, BUN 21 and creatinine 1.62 Troponin 1.94 Lactate 9.8 White blood count 9.1, hemoglobin 12.6, platelet count 166 BNP 1372 Lipase 16 INR 1.19 ____________________________________________  RADIOLOGY All Xrays were viewed by me. Imaging interpreted by Radiologist.  Chest portable 1 view: Stable low-volume chest. No evidence of active disease. Chronic cardiomegaly. __________________________________________  PROCEDURES  Procedure(s) performed: None  Critical Care performed: CRITICAL CARE Performed by: Governor Rooks   Total critical care time: 120 minutes  Critical care time was exclusive of separately billable procedures and treating other patients.  Critical care was necessary to treat or prevent imminent or life-threatening deterioration.  Critical care was time spent personally by me on the following activities: development of treatment plan with patient and/or surrogate as well as nursing, discussions with consultants, evaluation of patient's response  to treatment, examination of patient, obtaining history from patient or surrogate, ordering and performing treatments and interventions, ordering and review of laboratory studies, ordering and review of radiographic studies, pulse oximetry and re-evaluation of patient's condition.   ____________________________________________   ED COURSE / ASSESSMENT AND PLAN  Pertinent labs & imaging results that were available during my care of the patient were reviewed by me and considered in my medical decision making (see chart for details).  I was called to the patient's room with the EMS still at the bedside and patient essentially unresponsive with a little bit of responsiveness to sternal rub. Patient was placed on a nonrebreather while I talk to the patient's healthcare power of attorney, Ron, his brother. This patient arrived with what sounds like a syncopal episode.  I did not see the second episode which happened here just before he got to the room, but the nurse was concerned this could've been a seizure and that now he may be postictal. I did confirm he is protecting his airway, with intact gag reflex. I was considering intubating him at this point time, and had this discussion with the patient's healthcare power of attorney, brother Ron, who indicated that the patient has told him that he did not want to be intubated. I did discuss with him that the patient currently had a gag reflex, and good oxygenation on nonrebreather, and so I felt like it was okay to delay intubation especially given the fact that there was an indication that the patient would not want to be intubated.  We decided to go ahead and initiate workup. Initially I ordered a head CT as well as chest x-ray, EKG, and laboratory evaluation.  When patient was ready to go to CT scan, I did repeat his EKG showing complete heart block. I canceled him going to CT scan, because when I went in to talk with the nurse about placing the patient on  cardiac pads for the potential of pacing if needed in the future, the patient was actually awake and alert and talking to me, making my concern for neurologic source of the altered mental status episode  less likely. I canceled the head CT for the moment.  Patient's prior EKG showed incomplete left bundle branch block, and so the Left bundle branch block today did not seem completely brand-new in terms of acute STEMI. He was found to be in complete heart block. With a heart rate in the 50s, he was awake and alert and talking. I was unsure if the episodes of passing out may has been bradycardic in the 20s or 30s, but the description of nausea and dizziness followed by unresponsiveness considered possible that those symptoms were due to symptomatic bradycardia.  Patient stated his blood sugars have been elevated. His fingerstick was reading "high."  Patient was a diabetic, and reportedly trouble or problems with noncompliance in the past.  While the patient was awake and alert, he stated he had not really been ill recently, had not had any chest pain or trouble breathing.  I was called back to the bedside when the patient was witnessed by the nurse to become dizzy and nauseated and then heart rate go into the 20s. Patient vomited. Patient was unresponsive when I got into the room and being bagged.  I spoke again very quickly without care of attorney regarding the patient's essentially emergent need for intubation and cardiac pacing. He indicated again that the patient did not want to have intubation. We discussed that any delay in this could have been damage including brain damage.  I discussed the finding of complete heart block, and a concern that this patient would likely need cardiac pacing and at pacemaker, and is due to heart attack may need additional invasive treatments.  Ron did call his other brother, who initially felt like he wanted to pursue intubation, but after hearing that the patient had  told Ron that he did not want that, he too was comfortable that the patient's wishes be honored. The chaplain was at the bedside and reported that he was actually present when this patient stated his advanced directives a few weeks ago, and witnessed that this patient did not want intubation or CPR.  At this point the patient was made DO NOT RESUSCITATE, DO NOT INTUBATE, and comfort care.  Labs did come back at this point with an elevated troponin consistent with a heart attack and I did update the family with this information. Also blood sugar came back severely elevated around 1300. We discussed noninvasive treatment such as insulin drip, but given the other very concerning significant issues which were not likely treatable without invasive measures such as intubation, cardiac catheterization, pacemaker, a decision was made to keep the patient as comfort care.  Patient passed with time of death at 52.  I spoke with patient's primary care physician Dr. Judithann Sheen by phone, who will sign the death certificate. He indicated that this patient did have some noncompliance issues, and certainly was saddened by the news.    CONSULTATIONS:  Phone discussion with patient's primary care physician, Dr. Judithann Sheen.   Patient / Family / Caregiver informed of clinical course, medical decision-making process, and agree with plan.     ___________________________________________   FINAL CLINICAL IMPRESSION(S) / ED DIAGNOSES   Final diagnoses:  NSTEMI (non-ST elevated myocardial infarction) (HCC)  Complete heart block (HCC)  Hyperglycemic crisis in diabetes mellitus (HCC)              Note: This dictation was prepared with Dragon dictation. Any transcriptional errors that result from this process are unintentional   Governor Rooks, MD 02/26/2016 [phone]

## 2016-02-07 NOTE — ED Notes (Signed)
Agonal breathing of pt.  Family with pt.   Lab called with  Lactic acid 9.8   Dr Shaune Pollacklord aware.

## 2016-02-07 NOTE — Progress Notes (Signed)
   03/04/2016 1750  Clinical Encounter Type  Visited With Patient and family together  Visit Type ED;Code;Death  Referral From Nurse  Consult/Referral To Chaplain  Spiritual Encounters  Spiritual Needs Emotional;Grief support  Stress Factors  Patient Stress Factors Loss  Family Stress Factors Loss  Responded to ED for critically ill heart patient. Provided pastoral presence for patient & family. Remained with family after patient died. Chap. Kate Larock G. Constanza Mincy, ext. [phone]

## 2016-02-07 NOTE — ED Notes (Signed)
3rd iv started.  Pt talking and states i feel weak.  Pt vomited coffee ground material.  Pt became bradycardic in the 30's and 40's.  Pt turning mottled in face and neck.  Pt placed in [place] and bagged.

## 2016-02-07 NOTE — ED Notes (Signed)
Family requesting a room to keep pt comfortable.  md at bedside.  Pt unable to follow verbal commands.  Chaplin at bedside.  Iv fluids and oxygen in place.

## 2016-02-07 NOTE — ED Notes (Signed)
md at bedside.  Pt died at 1815.  Family and chaplin at bedside.

## 2016-03-05 DEATH — deceased

## 2017-02-18 IMAGING — CR DG TOE GREAT 2+V*R*
1 series · 3 of 3 positions shown · non-contrast
Comparison: None.

CLINICAL DATA: Right toe swelling for 2 weeks.  Diabetic patient.

EXAM:
RIGHT GREAT TOE

[Series 1: ap · 0.17mm/px · 3 of 3 slices shown]
[im 1/3]
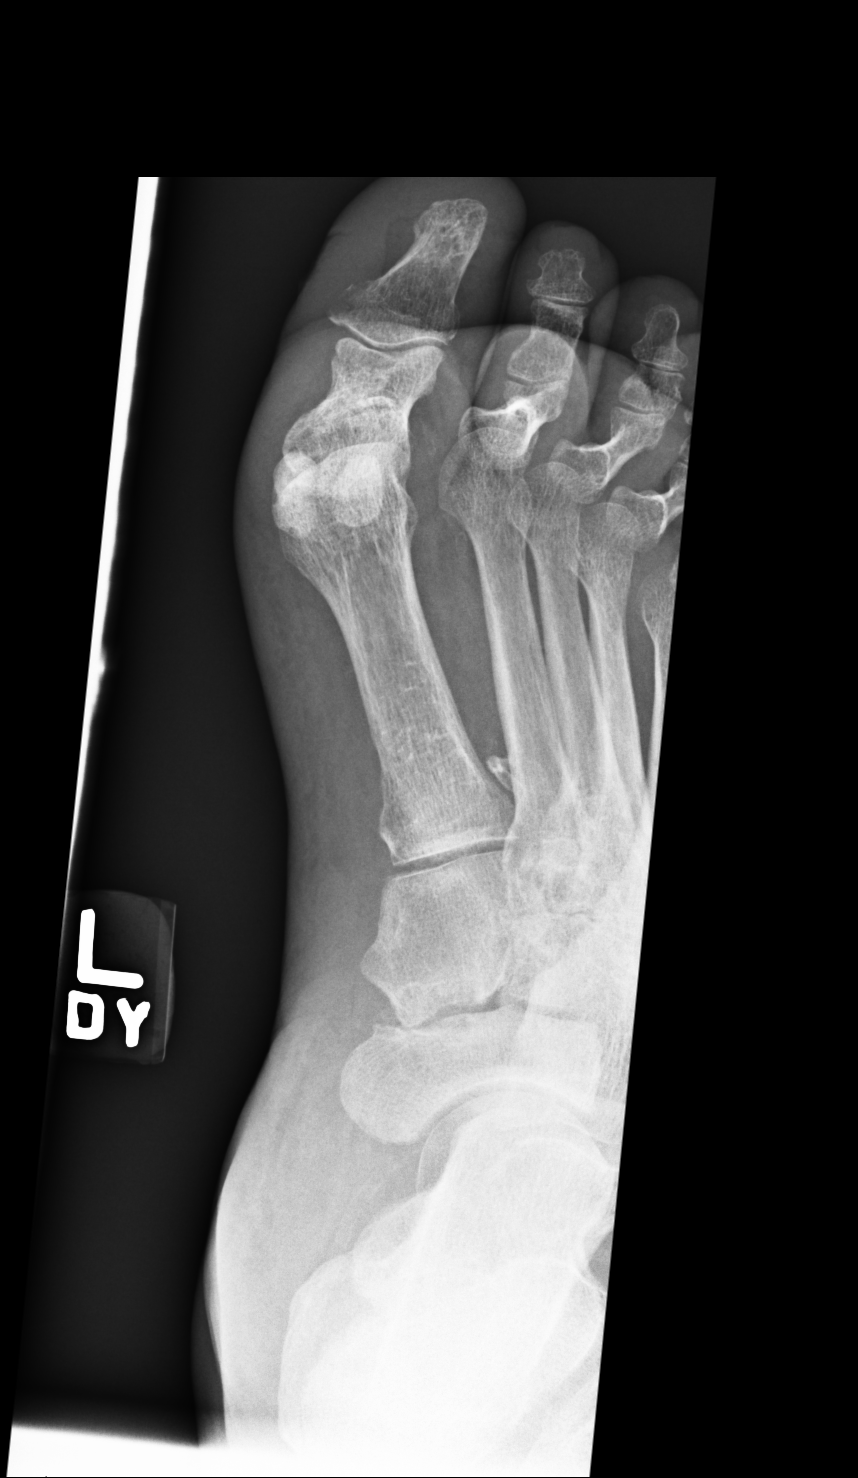
[im 2/3]
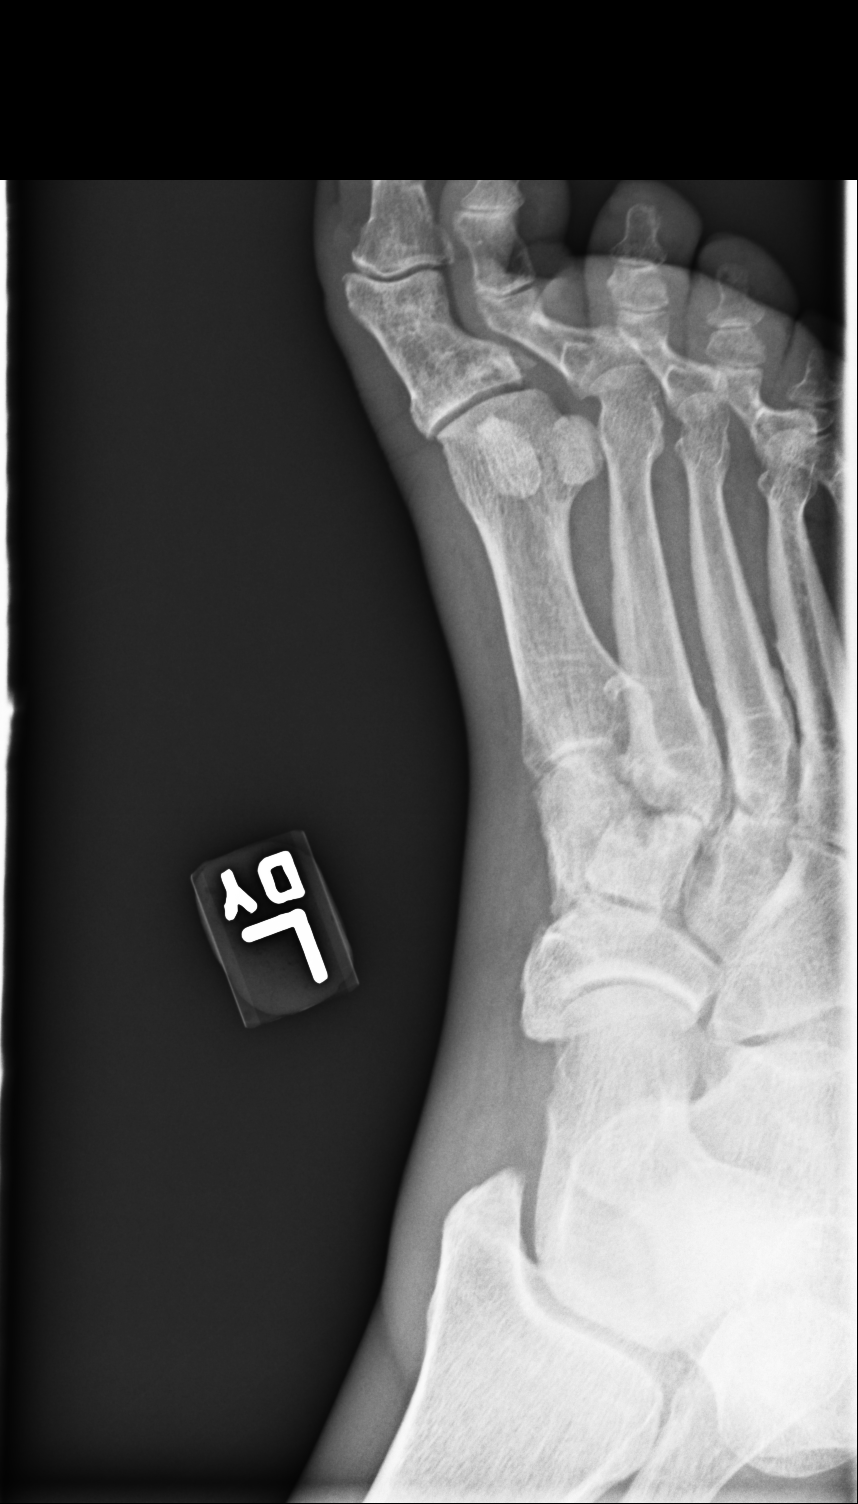
[im 3/3]
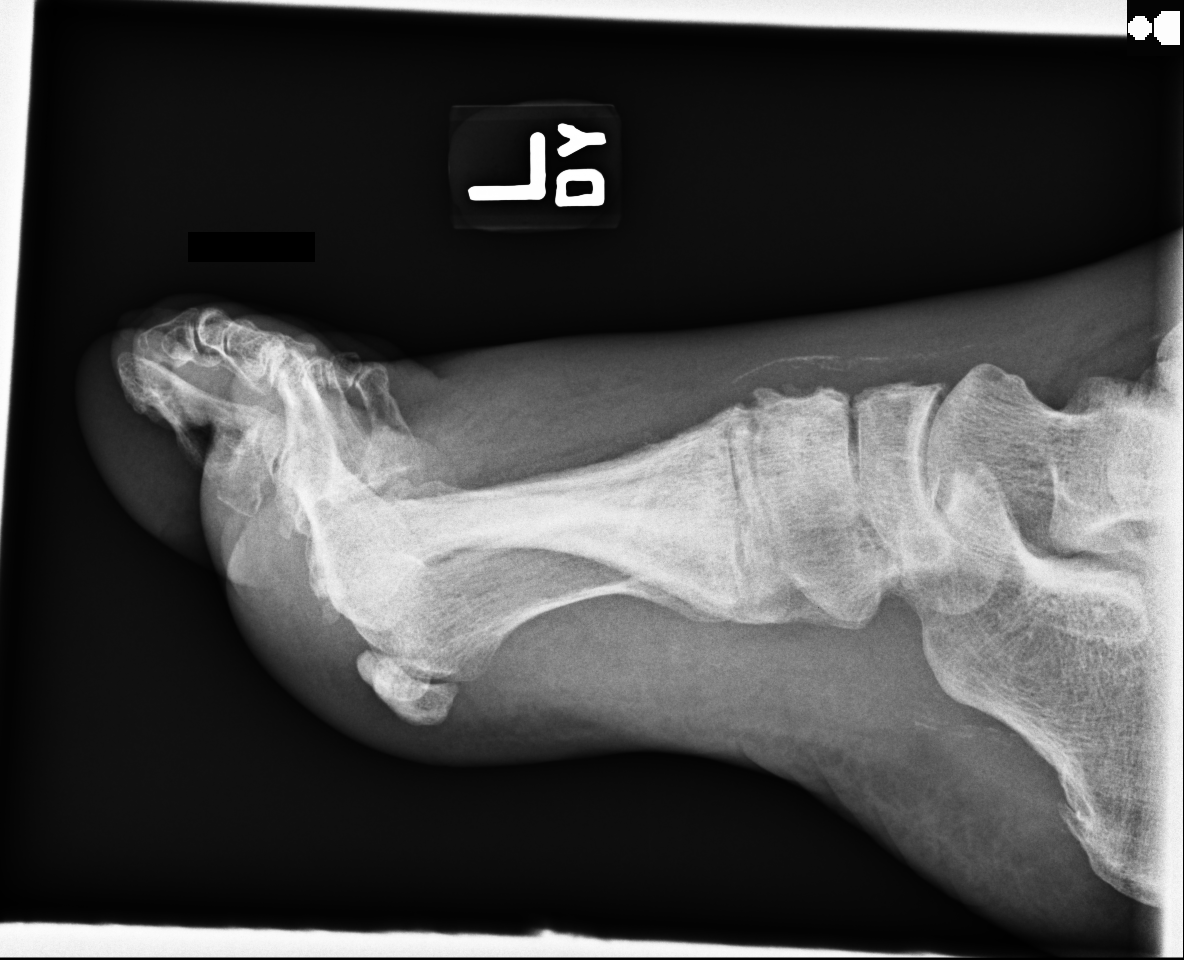

[3 of 3 positions shown; findings below may reference images not displayed]

FINDINGS: There is no evidence of fracture or dislocation. There is no
evidence of cortical erosions to suggest osteomyelitis
radiographically. Heterogeneous appearance of the bony matrix of the
first phalanx is noted, with uncertain significance. There is soft
tissue swelling of the dorsal and palmar aspect of the mid and
forefoot.
IMPRESSION: No evidence of fracture or subluxation.

No definitive radiographic evidence of osteomyelitis.

Heterogeneous appearance of the bony matrix of the first digit with
uncertain significance.

## 2017-02-20 IMAGING — MR MR FOOT*R* W/O CM
4 of 5 series · 30 of 40 positions shown · non-contrast
Comparison: 10/30/2015

CLINICAL DATA: Ulceration in a about 7 mm along the medial right
great toe with purulence noted. Erythema extends dorsally in the
foot.

EXAM:
MRI OF THE RIGHT FOREFOOT WITHOUT CONTRAST
TECHNIQUE: Multiplanar, multisequence MR imaging was performed. No intravenous
contrast was administered.

[Series 3: T1 · coronal · 3.0mm · 0.94mm/px · 9 of 40 slices shown (1 of 2)]
[im 1/40]
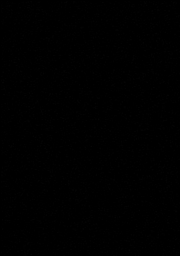
[im 5/40]
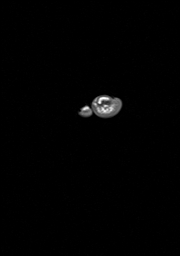
[im 10/40]
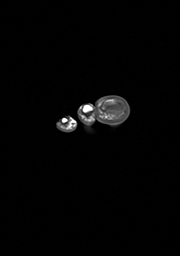
[im 15/40]
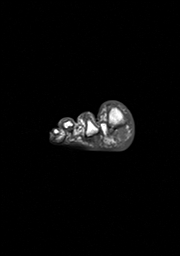
[im 20/40]
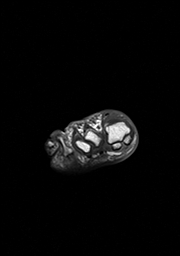
[im 25/40]
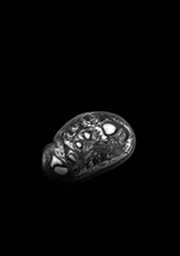
[im 30/40]
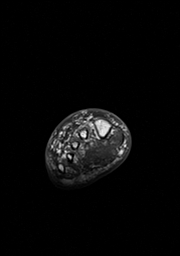
[im 35/40]
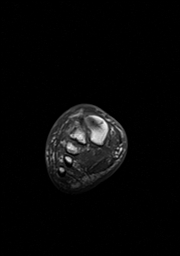
[im 40/40]
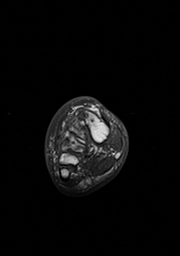

[Series 4: T2 fat-sat · coronal · 3.0mm · 0.43mm/px · 9 of 40 slices shown]
[im 1/40]
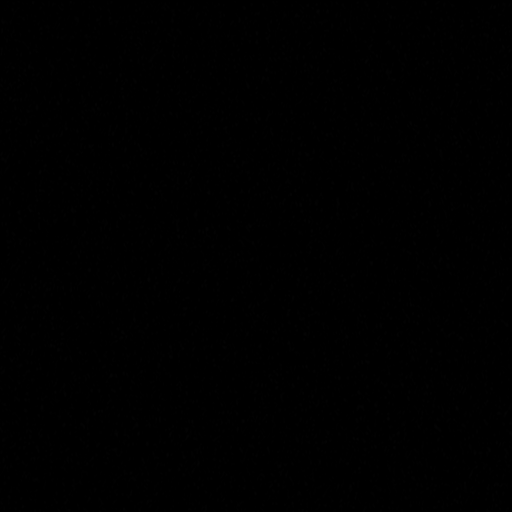
[im 5/40]
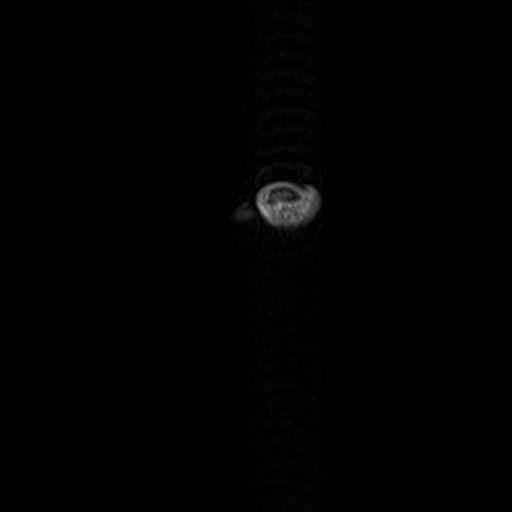
[im 10/40]
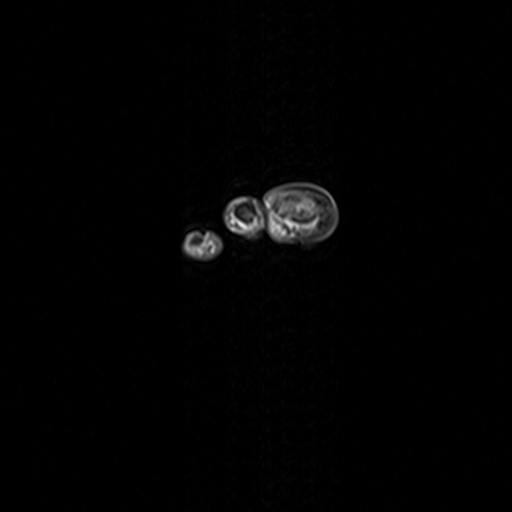
[im 15/40]
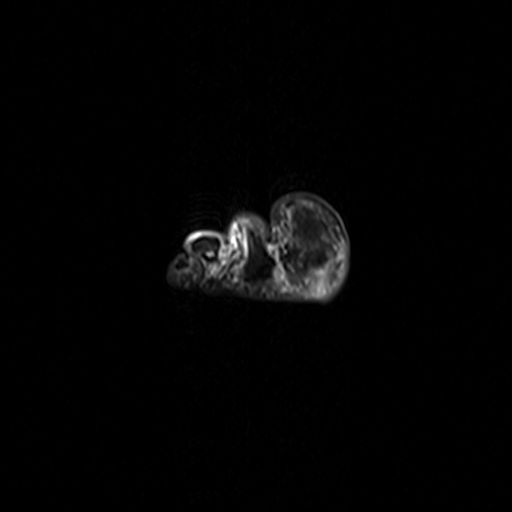
[im 20/40]
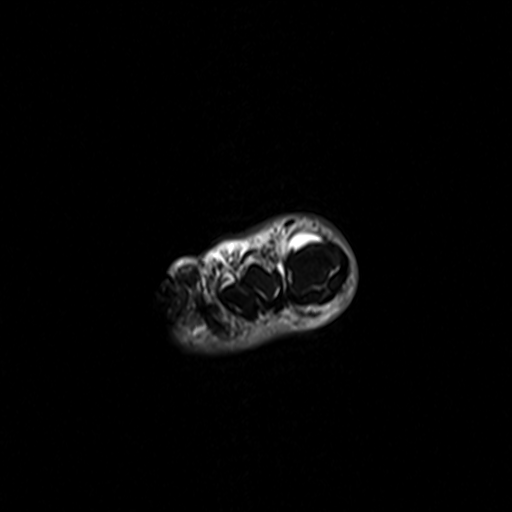
[im 25/40]
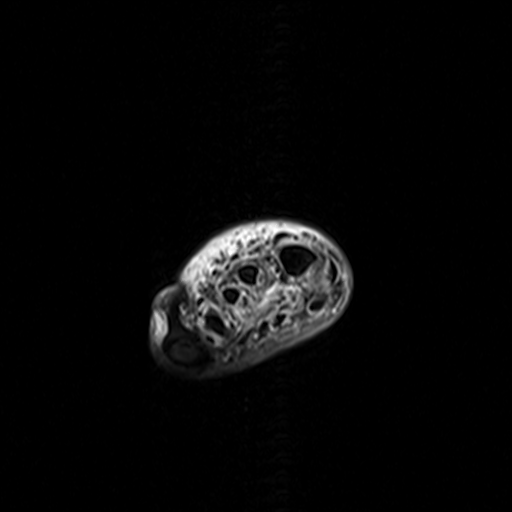
[im 30/40]
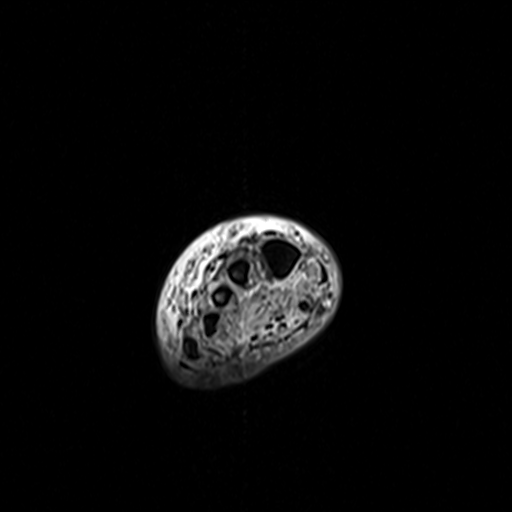
[im 35/40]
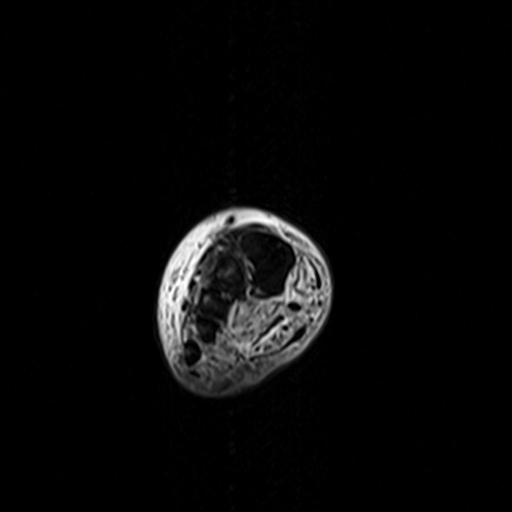
[im 40/40]
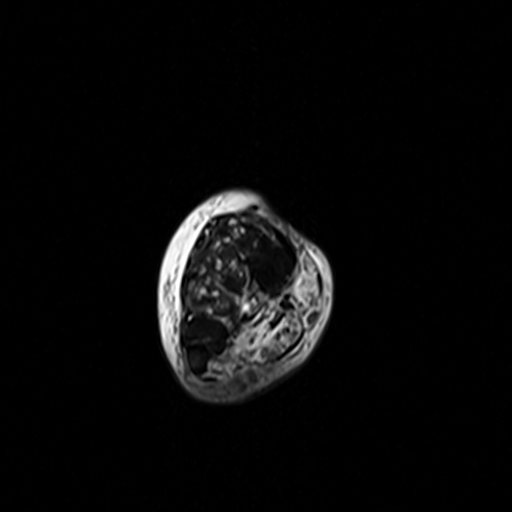

[Series 5: T1 · axial · 3.0mm · 0.78mm/px · z∈[-113,-37]mm · 7 of 27 slices shown (2 of 2)]
[im 1/27]
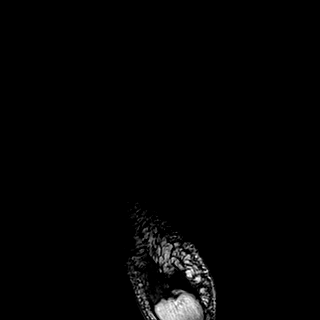
[im 5/27]
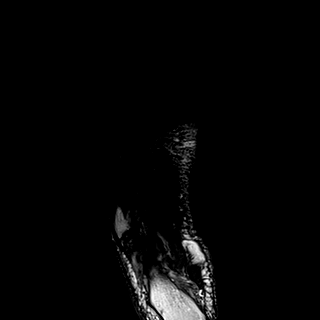
[im 9/27]
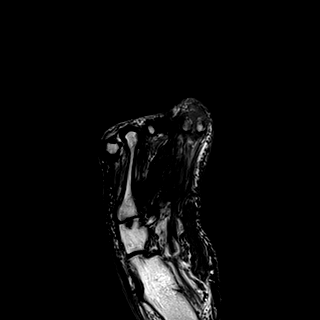
[im 14/27]
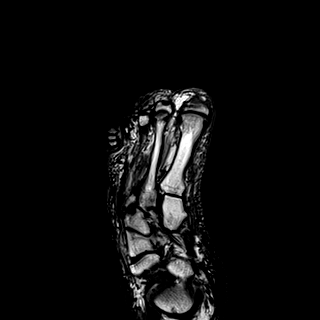
[im 18/27]
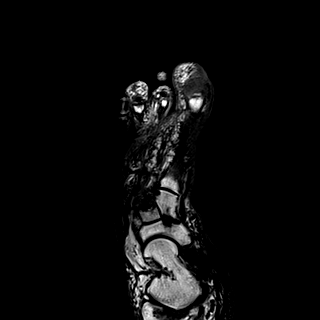
[im 22/27]
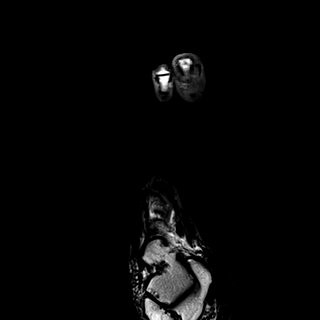
[im 27/27]
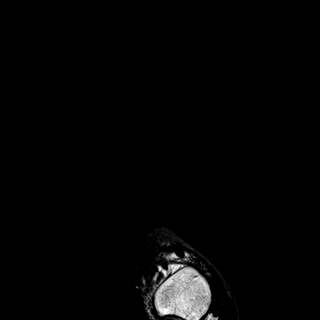

[Series 6: STIR · axial · 3.0mm · 0.49mm/px · z∈[-113,-52]mm · 5 of 27 slices shown]
[im 1/27]
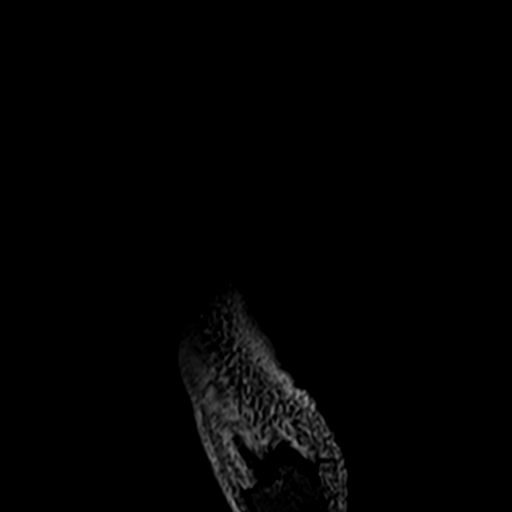
[im 5/27]
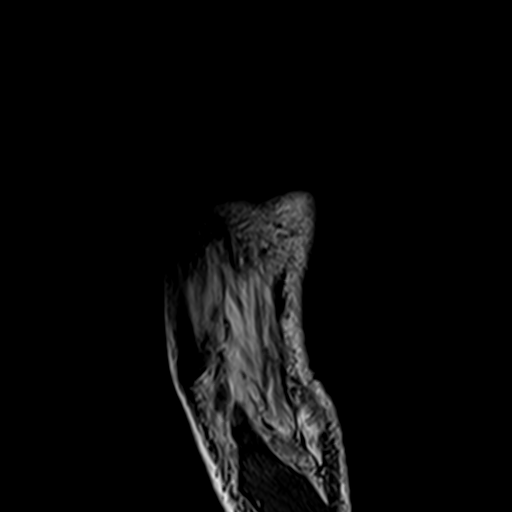
[im 9/27]
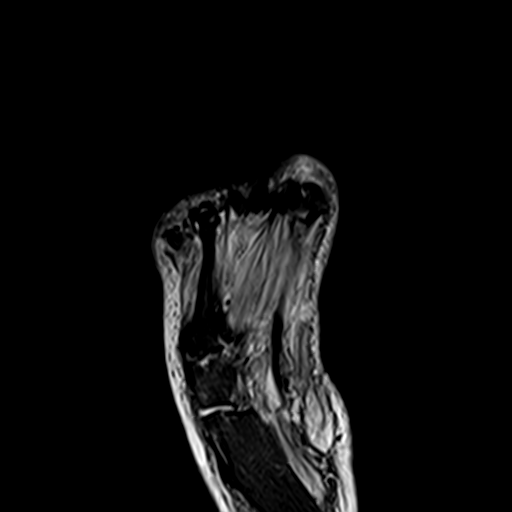
[im 14/27]
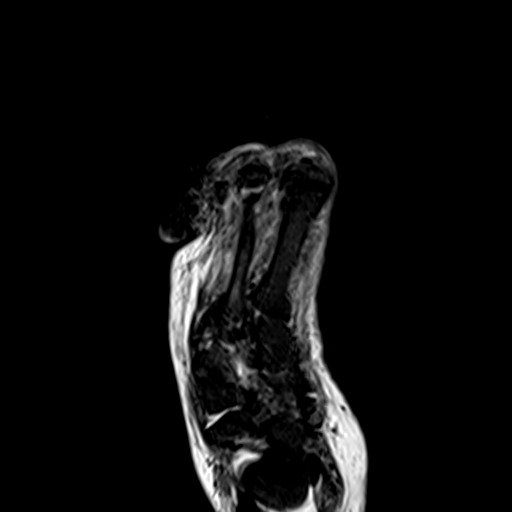
[im 22/27]
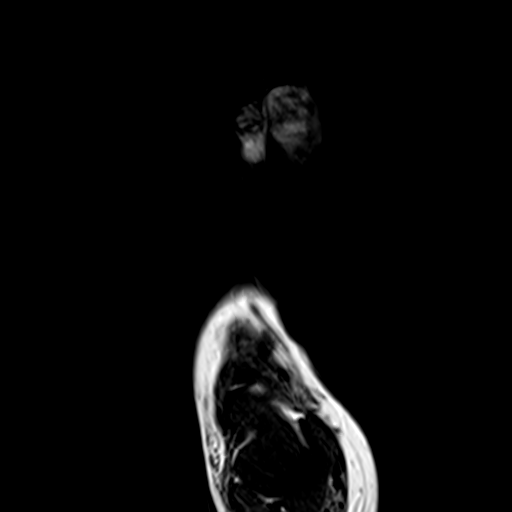

[30 of 40 positions shown; findings below may reference images not displayed]

FINDINGS: There is subcutaneous edema diffusely in the foot especially
dorsally, and extending into the ankle both medially and laterally.
Low-level edema extends into the toes.

On image 32 series 4 there is a defect in the skin and subcutaneous
tissues medially along the great toe approximately at the level of
the distal phalanx. The on images 23-25 of series 7 there seems to
be abnormal increased inversion recovery weighted signal in the base
of the distal phalanx suspicious for early osteomyelitis given the
immediately adjacent ulceration.

Sharply defined erosions or cystic lesions are present along the
Lisfranc joint is, including adjacent to the expected attachment
site of the Lisfranc ligament to the second metatarsal base.
However, there is no overt malalignment at the Lisfranc joint.
Erosions are present at the articulation of the navicular and medial
cuneiform.

There is thickening of the medial band of the plantar fascia
proximally.
IMPRESSION: 1. Ulceration medially along the great toe, with underlying edema
signal in the marrow of the base of the distal phalanx suspicious
for early osteomyelitis. No other osteomyelitis identified.
2. Cystic or erosive lesions along significant portions of the
Lisfranc joint and at the articulation of the distal navicular with
the medial cuneiform, suspicious for erosive arthropathy such as
rheumatoid arthropathy. An alternative would be early Charcot joint.
3. Edema tracking throughout the foot and ankle, especially
dorsally, potentially from cellulitis.
